# Patient Record
Sex: Male | Born: 1945
Health system: Southern US, Community
[De-identification: ages and names within clinical notes are randomized; demographics above are authoritative.]

## PROBLEM LIST (undated history)

## (undated) DIAGNOSIS — F419 Anxiety disorder, unspecified: Secondary | ICD-10-CM

## (undated) DIAGNOSIS — F329 Major depressive disorder, single episode, unspecified: Secondary | ICD-10-CM

## (undated) DIAGNOSIS — J309 Allergic rhinitis, unspecified: Secondary | ICD-10-CM

## (undated) DIAGNOSIS — F32A Depression, unspecified: Secondary | ICD-10-CM

## (undated) DIAGNOSIS — E119 Type 2 diabetes mellitus without complications: Secondary | ICD-10-CM

## (undated) DIAGNOSIS — M7918 Myalgia, other site: Secondary | ICD-10-CM

## (undated) DIAGNOSIS — N400 Enlarged prostate without lower urinary tract symptoms: Secondary | ICD-10-CM

## (undated) DIAGNOSIS — R0600 Dyspnea, unspecified: Secondary | ICD-10-CM

## (undated) DIAGNOSIS — E785 Hyperlipidemia, unspecified: Secondary | ICD-10-CM

## (undated) DIAGNOSIS — I10 Essential (primary) hypertension: Secondary | ICD-10-CM

## (undated) DIAGNOSIS — Z636 Dependent relative needing care at home: Secondary | ICD-10-CM

## (undated) DIAGNOSIS — M5030 Other cervical disc degeneration, unspecified cervical region: Secondary | ICD-10-CM

## (undated) DIAGNOSIS — R7302 Impaired glucose tolerance (oral): Secondary | ICD-10-CM

## (undated) DIAGNOSIS — E8881 Metabolic syndrome: Secondary | ICD-10-CM

## (undated) HISTORY — DX: Benign prostatic hyperplasia without lower urinary tract symptoms: N40.0

## (undated) HISTORY — DX: Impaired glucose tolerance (oral): R73.02

## (undated) HISTORY — PX: CARPAL TUNNEL RELEASE: SHX101

## (undated) HISTORY — PX: SHOULDER ARTHROSCOPY: SHX128

## (undated) HISTORY — DX: Hyperlipidemia, unspecified: E78.5

## (undated) HISTORY — DX: Essential (primary) hypertension: I10

## (undated) HISTORY — PX: BACK SURGERY: SHX140

---
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
## 1998-01-04 ENCOUNTER — Ambulatory Visit (HOSPITAL_COMMUNITY): Admission: RE | Admit: 1998-01-04 | Discharge: 1998-01-04 | Payer: Self-pay | Admitting: Internal Medicine

## 1998-01-04 ENCOUNTER — Encounter: Payer: Self-pay | Admitting: Internal Medicine

## 1998-08-19 ENCOUNTER — Ambulatory Visit (HOSPITAL_COMMUNITY): Admission: RE | Admit: 1998-08-19 | Discharge: 1998-08-19 | Payer: Self-pay | Admitting: Internal Medicine

## 1998-08-19 ENCOUNTER — Encounter: Payer: Self-pay | Admitting: Internal Medicine

## 1998-09-29 ENCOUNTER — Encounter: Payer: Self-pay | Admitting: Emergency Medicine

## 1998-09-29 ENCOUNTER — Emergency Department (HOSPITAL_COMMUNITY): Admission: EM | Admit: 1998-09-29 | Discharge: 1998-09-29 | Payer: Self-pay | Admitting: Emergency Medicine

## 1998-10-25 ENCOUNTER — Encounter: Payer: Self-pay | Admitting: Internal Medicine

## 1998-10-25 ENCOUNTER — Ambulatory Visit (HOSPITAL_COMMUNITY): Admission: RE | Admit: 1998-10-25 | Discharge: 1998-10-25 | Payer: Self-pay | Admitting: Internal Medicine

## 1998-10-26 ENCOUNTER — Emergency Department (HOSPITAL_COMMUNITY): Admission: EM | Admit: 1998-10-26 | Discharge: 1998-10-26 | Payer: Self-pay | Admitting: *Deleted

## 1999-12-23 ENCOUNTER — Ambulatory Visit (HOSPITAL_COMMUNITY): Admission: RE | Admit: 1999-12-23 | Discharge: 1999-12-23 | Payer: Self-pay | Admitting: *Deleted

## 2005-06-09 ENCOUNTER — Observation Stay (HOSPITAL_COMMUNITY): Admission: EM | Admit: 2005-06-09 | Discharge: 2005-06-10 | Payer: Self-pay | Admitting: Emergency Medicine

## 2005-06-17 ENCOUNTER — Encounter: Payer: Self-pay | Admitting: Cardiovascular Disease

## 2005-07-29 ENCOUNTER — Encounter: Admission: RE | Admit: 2005-07-29 | Discharge: 2005-07-29 | Payer: Self-pay | Admitting: Internal Medicine

## 2006-06-24 ENCOUNTER — Encounter: Admission: RE | Admit: 2006-06-24 | Discharge: 2006-09-22 | Payer: Self-pay | Admitting: Family Medicine

## 2008-01-11 ENCOUNTER — Ambulatory Visit: Payer: Self-pay | Admitting: Internal Medicine

## 2008-01-11 DIAGNOSIS — N401 Enlarged prostate with lower urinary tract symptoms: Secondary | ICD-10-CM | POA: Insufficient documentation

## 2008-01-11 DIAGNOSIS — E785 Hyperlipidemia, unspecified: Secondary | ICD-10-CM | POA: Insufficient documentation

## 2008-01-11 DIAGNOSIS — N400 Enlarged prostate without lower urinary tract symptoms: Secondary | ICD-10-CM | POA: Insufficient documentation

## 2008-01-11 DIAGNOSIS — I10 Essential (primary) hypertension: Secondary | ICD-10-CM | POA: Insufficient documentation

## 2008-01-12 ENCOUNTER — Encounter: Payer: Self-pay | Admitting: Internal Medicine

## 2008-01-12 LAB — CONVERTED CEMR LAB
ALT: 30 units/L (ref 0–53)
AST: 25 units/L (ref 0–37)
Albumin: 3.8 g/dL (ref 3.5–5.2)
Alkaline Phosphatase: 50 units/L (ref 39–117)
BUN: 11 mg/dL (ref 6–23)
Bacteria, UA: NEGATIVE
Basophils Absolute: 0.1 10*3/uL (ref 0.0–0.1)
Basophils Relative: 0.8 % (ref 0.0–3.0)
Bilirubin Urine: NEGATIVE
Bilirubin, Direct: 0.1 mg/dL (ref 0.0–0.3)
CO2: 30 meq/L (ref 19–32)
Calcium: 8.9 mg/dL (ref 8.4–10.5)
Chloride: 105 meq/L (ref 96–112)
Cholesterol: 229 mg/dL (ref 0–200)
Creatinine, Ser: 1 mg/dL (ref 0.4–1.5)
Crystals: NEGATIVE
Direct LDL: 124.5 mg/dL
Eosinophils Absolute: 0.4 10*3/uL (ref 0.0–0.7)
Eosinophils Relative: 6.4 % — ABNORMAL HIGH (ref 0.0–5.0)
GFR calc Af Amer: 97 mL/min
GFR calc non Af Amer: 80 mL/min
Glucose, Bld: 99 mg/dL (ref 70–99)
HCT: 45.4 % (ref 39.0–52.0)
HDL: 33.6 mg/dL — ABNORMAL LOW (ref >39.0)
Hemoglobin, Urine: NEGATIVE
Hemoglobin: 16 g/dL (ref 13.0–17.0)
Hgb A1c MFr Bld: 5.7 % (ref 4.6–6.0)
Ketones, ur: NEGATIVE mg/dL
Leukocytes, UA: NEGATIVE
Lymphocytes Relative: 36.9 % (ref 12.0–46.0)
MCHC: 35.2 g/dL (ref 30.0–36.0)
MCV: 94 fL (ref 78.0–100.0)
Monocytes Absolute: 0.6 10*3/uL (ref 0.1–1.0)
Monocytes Relative: 8.8 % (ref 3.0–12.0)
Neutro Abs: 3.2 10*3/uL (ref 1.4–7.7)
Neutrophils Relative %: 47.1 % (ref 43.0–77.0)
Nitrite: NEGATIVE
PSA: 0.57 ng/mL (ref 0.10–4.00)
Platelets: 214 10*3/uL (ref 150–400)
Potassium: 3.8 meq/L (ref 3.5–5.1)
RBC: 4.84 M/uL (ref 4.22–5.81)
RDW: 12.3 % (ref 11.5–14.6)
Sodium: 142 meq/L (ref 135–145)
Specific Gravity, Urine: 1.025 (ref 1.000–1.03)
Squamous Epithelial / HPF: NEGATIVE /lpf
TSH: 4.67 microintl units/mL (ref 0.35–5.50)
Total Bilirubin: 0.7 mg/dL (ref 0.3–1.2)
Total CHOL/HDL Ratio: 6.8
Total Protein, Urine: NEGATIVE mg/dL
Total Protein: 7.1 g/dL (ref 6.0–8.3)
Triglycerides: 317 mg/dL (ref 0–149)
Urine Glucose: NEGATIVE mg/dL
Urobilinogen, UA: 0.2 (ref 0.0–1.0)
VLDL: 63 mg/dL — ABNORMAL HIGH (ref 0–40)
WBC: 6.8 10*3/uL (ref 4.5–10.5)
pH: 5.5 (ref 5.0–8.0)

## 2008-01-26 ENCOUNTER — Ambulatory Visit: Payer: Self-pay | Admitting: Internal Medicine

## 2008-02-13 ENCOUNTER — Telehealth: Payer: Self-pay | Admitting: Internal Medicine

## 2008-07-18 ENCOUNTER — Ambulatory Visit: Payer: Self-pay | Admitting: Internal Medicine

## 2008-07-18 DIAGNOSIS — J3089 Other allergic rhinitis: Secondary | ICD-10-CM | POA: Insufficient documentation

## 2008-07-18 LAB — CONVERTED CEMR LAB
Cholesterol, target level: 200 mg/dL
LDL Goal: 130 mg/dL

## 2008-11-21 ENCOUNTER — Encounter: Payer: Self-pay | Admitting: Internal Medicine

## 2008-12-27 ENCOUNTER — Ambulatory Visit: Payer: Self-pay | Admitting: Internal Medicine

## 2008-12-27 DIAGNOSIS — E8881 Metabolic syndrome: Secondary | ICD-10-CM | POA: Insufficient documentation

## 2008-12-27 LAB — CONVERTED CEMR LAB
Albumin: 4.2 g/dL (ref 3.5–5.2)
Alkaline Phosphatase: 46 units/L (ref 39–117)
Basophils Relative: 0.5 % (ref 0.0–3.0)
Bilirubin Urine: NEGATIVE
CO2: 30 meq/L (ref 19–32)
Chloride: 101 meq/L (ref 96–112)
Cholesterol: 224 mg/dL — ABNORMAL HIGH (ref 0–200)
Eosinophils Absolute: 0.2 10*3/uL (ref 0.0–0.7)
HCT: 46.3 % (ref 39.0–52.0)
Hemoglobin, Urine: NEGATIVE
Hemoglobin: 16.3 g/dL (ref 13.0–17.0)
Lymphs Abs: 1.8 10*3/uL (ref 0.7–4.0)
MCHC: 35.3 g/dL (ref 30.0–36.0)
MCV: 93.5 fL (ref 78.0–100.0)
Monocytes Absolute: 0.6 10*3/uL (ref 0.1–1.0)
Neutro Abs: 3.1 10*3/uL (ref 1.4–7.7)
Nitrite: NEGATIVE
PSA: 0.54 ng/mL (ref 0.10–4.00)
RBC: 4.95 M/uL (ref 4.22–5.81)
Sodium: 140 meq/L (ref 135–145)
Total CHOL/HDL Ratio: 6
Total Protein, Urine: NEGATIVE mg/dL
Total Protein: 8.2 g/dL (ref 6.0–8.3)

## 2009-02-27 ENCOUNTER — Ambulatory Visit: Payer: Self-pay | Admitting: Internal Medicine

## 2009-03-01 ENCOUNTER — Telehealth: Payer: Self-pay | Admitting: Internal Medicine

## 2009-03-07 ENCOUNTER — Encounter: Admission: RE | Admit: 2009-03-07 | Discharge: 2009-03-07 | Payer: Self-pay | Admitting: Internal Medicine

## 2009-04-05 ENCOUNTER — Telehealth: Payer: Self-pay | Admitting: Internal Medicine

## 2009-08-21 ENCOUNTER — Ambulatory Visit: Payer: Self-pay | Admitting: Internal Medicine

## 2010-03-13 ENCOUNTER — Telehealth: Payer: Self-pay | Admitting: Internal Medicine

## 2010-04-03 ENCOUNTER — Other Ambulatory Visit: Payer: Self-pay | Admitting: Internal Medicine

## 2010-04-03 ENCOUNTER — Encounter: Payer: Self-pay | Admitting: Internal Medicine

## 2010-04-03 ENCOUNTER — Ambulatory Visit
Admission: RE | Admit: 2010-04-03 | Discharge: 2010-04-03 | Payer: Self-pay | Source: Home / Self Care | Attending: Internal Medicine | Admitting: Internal Medicine

## 2010-04-03 DIAGNOSIS — R7309 Other abnormal glucose: Secondary | ICD-10-CM | POA: Insufficient documentation

## 2010-04-03 DIAGNOSIS — R740 Nonspecific elevation of levels of transaminase and lactic acid dehydrogenase [LDH]: Secondary | ICD-10-CM

## 2010-04-03 DIAGNOSIS — R7401 Elevation of levels of liver transaminase levels: Secondary | ICD-10-CM | POA: Insufficient documentation

## 2010-04-03 LAB — CBC WITH DIFFERENTIAL/PLATELET
Basophils Absolute: 0.1 10*3/uL (ref 0.0–0.1)
Basophils Relative: 1 % (ref 0.0–3.0)
Eosinophils Absolute: 0.3 10*3/uL (ref 0.0–0.7)
Eosinophils Relative: 4 % (ref 0.0–5.0)
HCT: 45.9 % (ref 39.0–52.0)
Hemoglobin: 16.1 g/dL (ref 13.0–17.0)
Lymphocytes Relative: 30.1 % (ref 12.0–46.0)
Lymphs Abs: 2.2 10*3/uL (ref 0.7–4.0)
MCHC: 35 g/dL (ref 30.0–36.0)
MCV: 95.6 fl (ref 78.0–100.0)
Monocytes Absolute: 0.7 10*3/uL (ref 0.1–1.0)
Monocytes Relative: 9 % (ref 3.0–12.0)
Neutro Abs: 4.2 10*3/uL (ref 1.4–7.7)
Neutrophils Relative %: 55.9 % (ref 43.0–77.0)
Platelets: 228 10*3/uL (ref 150.0–400.0)
RBC: 4.8 Mil/uL (ref 4.22–5.81)
RDW: 13.1 % (ref 11.5–14.6)
WBC: 7.4 10*3/uL (ref 4.5–10.5)

## 2010-04-03 LAB — BASIC METABOLIC PANEL
BUN: 15 mg/dL (ref 6–23)
CO2: 30 mEq/L (ref 19–32)
Calcium: 9.5 mg/dL (ref 8.4–10.5)
Chloride: 96 mEq/L (ref 96–112)
Creatinine, Ser: 0.9 mg/dL (ref 0.4–1.5)
GFR: 88.9 mL/min (ref >60.00)
Glucose, Bld: 103 mg/dL — ABNORMAL HIGH (ref 70–99)
Potassium: 3.9 mEq/L (ref 3.5–5.1)
Sodium: 136 mEq/L (ref 135–145)

## 2010-04-03 LAB — LIPID PANEL
Cholesterol: 221 mg/dL — ABNORMAL HIGH (ref 0–200)
HDL: 46.2 mg/dL (ref >39.00)
Total CHOL/HDL Ratio: 5
Triglycerides: 325 mg/dL — ABNORMAL HIGH (ref 0.0–149.0)
VLDL: 65 mg/dL — ABNORMAL HIGH (ref 0.0–40.0)

## 2010-04-03 LAB — HEPATIC FUNCTION PANEL
ALT: 50 U/L (ref 0–53)
AST: 34 U/L (ref 0–37)
Albumin: 4.3 g/dL (ref 3.5–5.2)
Alkaline Phosphatase: 51 U/L (ref 39–117)
Bilirubin, Direct: 0.1 mg/dL (ref 0.0–0.3)
Total Bilirubin: 0.8 mg/dL (ref 0.3–1.2)
Total Protein: 7.9 g/dL (ref 6.0–8.3)

## 2010-04-03 LAB — HEMOGLOBIN A1C: Hgb A1c MFr Bld: 6.1 % (ref 4.6–6.5)

## 2010-04-03 LAB — LDL CHOLESTEROL, DIRECT: Direct LDL: 135 mg/dL

## 2010-04-03 LAB — PSA: PSA: 0.6 ng/mL (ref 0.10–4.00)

## 2010-04-03 LAB — TSH: TSH: 2.01 u[IU]/mL (ref 0.35–5.50)

## 2010-04-15 ENCOUNTER — Ambulatory Visit
Admission: RE | Admit: 2010-04-15 | Discharge: 2010-04-15 | Payer: Self-pay | Source: Home / Self Care | Attending: Internal Medicine | Admitting: Internal Medicine

## 2010-04-15 DIAGNOSIS — H6120 Impacted cerumen, unspecified ear: Secondary | ICD-10-CM | POA: Insufficient documentation

## 2010-04-15 DIAGNOSIS — J0100 Acute maxillary sinusitis, unspecified: Secondary | ICD-10-CM | POA: Insufficient documentation

## 2010-04-15 NOTE — Assessment & Plan Note (Signed)
Summary: fu med bp-oyu   Vital Signs:  Patient profile:   65 year old male Height:      68 inches Weight:      191 pounds BMI:     29.15 O2 Sat:      97 % on Room air Temp:     98.0 degrees F oral Pulse rate:   91 / minute Pulse rhythm:   regular Resp:     16 per minute BP sitting:   118 / 84  (left arm) Cuff size:   large  Vitals Entered By: Rock Nephew CMA (August 21, 2009 1:02 PM)  Nutrition Counseling: Patient's BMI is greater than 25 and therefore counseled on weight management options.  O2 Flow:  Room air  Primary Care Provider:  Etta Grandchild MD   History of Present Illness:  Follow-Up Visit      This is a 65 year old man who presents for Follow-up visit.  The patient denies chest pain, palpitations, dizziness, syncope, edema, SOB, DOE, PND, and orthopnea.  Since the last visit the patient notes no new problems or concerns.  The patient reports taking meds as prescribed, monitoring BP, and dietary compliance.  When questioned about possible medication side effects, the patient notes none.    Lipid Management History:      Positive NCEP/ATP III risk factors include male age 65 years old or older, family history for ischemic heart disease (males less than 67 years old), and hypertension.  Negative NCEP/ATP III risk factors include non-diabetic, non-tobacco-user status, no ASHD (atherosclerotic heart disease), no prior stroke/TIA, no peripheral vascular disease, and no history of aortic aneurysm.        The patient states that he knows about the "Therapeutic Lifestyle Change" diet.  His compliance with the TLC diet is not at all.  The patient expresses understanding of adjunctive measures for cholesterol lowering.  Adjunctive measures started by the patient include aerobic exercise, fiber, ASA, omega-3 supplements, limit alcohol consumpton, and weight reduction.  He expresses no side effects from his lipid-lowering medication.  The patient denies any symptoms to suggest  myopathy or liver disease.    Preventive Screening-Counseling & Management  Alcohol-Tobacco     Alcohol drinks/day: 0     Smoking Status: never     Passive Smoke Exposure: no  Hep-HIV-STD-Contraception     HIV Risk: no      Drug Use:  no.    Medications Prior to Update: 1)  Zocor 20 Mg Tabs (Simvastatin) .... Take 1 Tablet By Mouth Once A Day 2)  Aspirin 81 Mg Tbec (Aspirin) 3)  Benicar Hct 40-12.5 Mg Tabs (Olmesartan Medoxomil-Hctz) .... Once Daily 4)  Omnaris 50 Mcg/act Susp (Ciclesonide) .... 2 Puffs Each Nostril Qd  Current Medications (verified): 1)  Zocor 20 Mg Tabs (Simvastatin) .... Take 1 Tablet By Mouth Once A Day 2)  Aspirin 81 Mg Tbec (Aspirin) 3)  Benicar Hct 40-12.5 Mg Tabs (Olmesartan Medoxomil-Hctz) .... Once Daily 4)  Omnaris 50 Mcg/act Susp (Ciclesonide) .... 2 Puffs Each Nostril Qd  Allergies (verified): 1)  ! Enalapril Maleate (Enalapril Maleate)  Past History:  Past Medical History: Last updated: 01/11/2008 Hyperlipidemia Hypertension Benign prostatic hypertrophy  Past Surgical History: Last updated: 01/11/2008 Denies surgical history  Family History: Last updated: 01/11/2008 Family History of Alcoholism/Addiction Family History of CAD Male 1st degree relative <50 Family History Diabetes 1st degree relative Family History High cholesterol Family History Kidney disease  Social History: Last updated: 01/11/2008 Single Never  Smoked Alcohol use-no Drug use-no Regular exercise-no Occupation: [employer]  Risk Factors: Alcohol Use: 0 (08/21/2009) Exercise: no (01/11/2008)  Risk Factors: Smoking Status: never (08/21/2009) Passive Smoke Exposure: no (08/21/2009)  Family History: Reviewed history from 01/11/2008 and no changes required. Family History of Alcoholism/Addiction Family History of CAD Male 1st degree relative <50 Family History Diabetes 1st degree relative Family History High cholesterol Family History Kidney  disease  Social History: Reviewed history from 01/11/2008 and no changes required. Single Never Smoked Alcohol use-no Drug use-no Regular exercise-no Occupation: Public affairs consultant  Review of Systems  The patient denies anorexia, fever, weight loss, weight gain, chest pain, peripheral edema, prolonged cough, abdominal pain, and enlarged lymph nodes.    Physical Exam  General:  alert, well-developed, well-nourished, well-hydrated, appropriate dress, normal appearance, healthy-appearing, cooperative to examination, and good hygiene.   Mouth:  Oral mucosa and oropharynx without lesions or exudates.  Teeth in good repair. Neck:  supple, full ROM, no masses, no thyromegaly, no JVD, normal carotid upstroke, and no carotid bruits.   Lungs:  Normal respiratory effort, chest expands symmetrically. Lungs are clear to auscultation, no crackles or wheezes. Heart:  Normal rate and regular rhythm. S1 and S2 normal without gallop, murmur, click, rub or other extra sounds. Abdomen:  soft, non-tender, normal bowel sounds, no distention, no masses, no guarding, no rigidity, no rebound tenderness, no abdominal hernia, no inguinal hernia, no hepatomegaly, and no splenomegaly.   Msk:  normal ROM, no joint tenderness, no joint swelling, no joint warmth, and no redness over joints.   Pulses:  R and L carotid,radial,femoral,dorsalis pedis and posterior tibial pulses are full and equal bilaterally Extremities:  No clubbing, cyanosis, edema, or deformity noted with normal full range of motion of all joints.   Neurologic:  No cranial nerve deficits noted. Station and gait are normal. Plantar reflexes are down-going bilaterally. DTRs are symmetrical throughout. Sensory, motor and coordinative functions appear intact. Skin:  turgor normal, color normal, no rashes, no suspicious lesions, no ecchymoses, no petechiae, and no purpura.   Cervical Nodes:  no anterior cervical adenopathy and no posterior cervical adenopathy.    Psych:  Cognition and judgment appear intact. Alert and cooperative with normal attention span and concentration. No apparent delusions, illusions, hallucinations   Impression & Recommendations:  Problem # 1:  HYPERTENSION (ICD-401.9) Assessment Improved  His updated medication list for this problem includes:    Benicar Hct 40-12.5 Mg Tabs (Olmesartan medoxomil-hctz) ..... Once daily  Problem # 2:  HYPERLIPIDEMIA (ICD-272.4) Assessment: Improved  His updated medication list for this problem includes:    Zocor 20 Mg Tabs (Simvastatin) .[name] Kitchen... Take 1 tablet by mouth once a day  Labs Reviewed: SGOT: 45 (12/27/2008)   SGPT: 81 (12/27/2008)  Lipid Goals: Chol Goal: 200 (07/18/2008)   HDL Goal: 40 (07/18/2008)   LDL Goal: 130 (07/18/2008)   TG Goal: 150 (07/18/2008)  Prior 10 Yr Risk Heart Disease: Not enough information (07/18/2008)   HDL:40.10 (12/27/2008), 33.6 (01/12/2008)  LDL:DEL (01/12/2008)  Chol:224 (12/27/2008), 229 (01/12/2008)  Trig:193.0 (12/27/2008), 317 (01/12/2008)  Complete Medication List: 1)  Zocor 20 Mg Tabs (Simvastatin) .... Take 1 tablet by mouth once a day 2)  Aspirin 81 Mg Tbec (Aspirin) 3)  Benicar Hct 40-12.5 Mg Tabs (Olmesartan medoxomil-hctz) .... Once daily 4)  Omnaris 50 Mcg/act Susp (Ciclesonide) .... 2 puffs each nostril qd  Lipid Assessment/Plan:      Based on NCEP/ATP III, the patient's risk factor category is "2 or more risk factors  and a calculated 10 year CAD risk of > 20%".  The patient's lipid goals are as follows: Total cholesterol goal is 200; LDL cholesterol goal is 130; HDL cholesterol goal is 40; Triglyceride goal is 150.     Patient Instructions: 1)  Please schedule a follow-up appointment in 4 months. 2)  It is important that you exercise regularly at least 20 minutes 5 times a week. If you develop chest pain, have severe difficulty breathing, or feel very tired , stop exercising immediately and seek medical attention. 3)  You need to  lose weight. Consider a lower calorie diet and regular exercise.  4)  Check your Blood Pressure regularly. If it is above 130/80: you should make an appointment.

## 2010-04-15 NOTE — Progress Notes (Signed)
Summary: RESULTS  Phone Note Call from Patient Call back at [facility] Phone [phone]   Summary of Call: Patient is requesting results of u/s.  Initial call taken by: Lamar Sprinkles, CMA,  April 05, 2009 11:40 AM  Follow-up for Phone Call        normal Follow-up by: Etta Grandchild MD,  April 05, 2009 11:44 AM  Additional Follow-up for Phone Call Additional follow up Details #1::        Pt notified Additional Follow-up by: Rock Nephew CMA,  April 05, 2009 5:00 PM

## 2010-04-17 NOTE — Letter (Signed)
Summary: Lipid Letter  [facility]  [address] [place], [place] [address]   Phone: [phone]  Fax: [phone]    04/03/2010  Sal Spratley [address] [place], [place]  [address]  Dear Martin [name]:  We have carefully reviewed your last lipid profile from 01/12/2008 and the results are noted below with a summary of recommendations for lipid management.    Cholesterol:       221     Goal: <200   HDL "good" Cholesterol:   42.35     Goal: >40   LDL "bad" Cholesterol:   135     Goal: <130   Triglycerides:       325.0     Goal: <150 wow    your blood sugars are slightly high consistent with early type II diabetes    TLC Diet (Therapeutic Lifestyle Change): Saturated Fats & Transfatty acids should be kept < 7% of total calories ***Reduce Saturated Fats Polyunstaurated Fat can be up to 10% of total calories Monounsaturated Fat Fat can be up to 20% of total calories Total Fat should be no greater than 25-35% of total calories Carbohydrates should be 50-60% of total calories Protein should be approximately 15% of total calories Fiber should be at least 20-30 grams a day ***Increased fiber may help lower LDL Total Cholesterol should be < 200mg /day Consider adding plant stanol/sterols to diet (example: Benacol spread) ***A higher intake of unsaturated fat may reduce Triglycerides and Increase HDL    Adjunctive Measures (may lower LIPIDS and reduce risk of Heart Attack) include: Aerobic Exercise (20-30 minutes 3-4 times a week) Limit Alcohol Consumption Weight Reduction Aspirin 75-81 mg a day by mouth (if not allergic or contraindicated) Dietary Fiber 20-30 grams a day by mouth     Current Medications: 1)    Zocor 20 Mg Tabs (Simvastatin) .... Take 1 tablet by mouth once a day 2)    Aspirin 81 Mg Tbec (Aspirin) 3)    Omnaris 50 Mcg/act Susp (Ciclesonide) .... 2 puffs each nostril qd 4)    Tribenzor 40-10-25 Mg Tabs (Olmesartan-amlodipine-hctz) .... One by mouth  once daily for high blood pressure  If you have any questions, please call. We appreciate being able to work with you.   Sincerely,    [facility] Etta Grandchild MD

## 2010-04-17 NOTE — Assessment & Plan Note (Signed)
Summary: FU/ WANTS LABS/ TO COME FASTING/NWS   Vital Signs:  Patient profile:   65 year old male Height:      68 inches Weight:      197.75 pounds BMI:     30.18 O2 Sat:      94 % on Room air Temp:     98 degrees F oral Pulse rate:   75 / minute Pulse rhythm:   regular Resp:     16 per minute BP supine:   158 / 90  (right arm) BP sitting:   156 / 90  (left arm) Cuff size:   regular  Vitals Entered By: Zella Ball Ewing CMA Duncan Dull) (April 03, 2010 10:04 AM)  Nutrition Counseling: Patient's BMI is greater than 25 and therefore counseled on weight management options.  O2 Flow:  Room air CC: followup/RE, Preventive Care, Hypertension Management, Lipid Management   Primary Care Provider:  Etta Grandchild MD  CC:  followup/RE, Preventive Care, Hypertension Management, and Lipid Management.  History of Present Illness: He returns for a complete physical and f/up on medical probelms.  Hypertension History:      He denies headache, chest pain, palpitations, dyspnea with exertion, orthopnea, PND, peripheral edema, visual symptoms, neurologic problems, syncope, and side effects from treatment.  He notes no problems with any antihypertensive medication side effects.        Positive major cardiovascular risk factors include male age 37 years old or older, hyperlipidemia, hypertension, and family history for ischemic heart disease (males less than 8 years old).  Negative major cardiovascular risk factors include no history of diabetes and non-tobacco-user status.        Further assessment for target organ damage reveals no history of ASHD, cardiac end-organ damage (CHF/LVH), stroke/TIA, peripheral vascular disease, renal insufficiency, or hypertensive retinopathy.    Lipid Management History:      Positive NCEP/ATP III risk factors include male age 68 years old or older, family history for ischemic heart disease (males less than 32 years old), and hypertension.  Negative NCEP/ATP III risk  factors include non-diabetic, non-tobacco-user status, no ASHD (atherosclerotic heart disease), no prior stroke/TIA, no peripheral vascular disease, and no history of aortic aneurysm.        The patient states that he knows about the "Therapeutic Lifestyle Change" diet.  His compliance with the TLC diet is not at all.  The patient expresses understanding of adjunctive measures for cholesterol lowering.  Adjunctive measures started by the patient include fiber, omega-3 supplements, limit alcohol consumpton, and weight reduction.  He expresses no side effects from his lipid-lowering medication.  The patient denies any symptoms to suggest myopathy or liver disease.      Preventive Screening-Counseling & Management  Alcohol-Tobacco     Alcohol drinks/day: 0     Alcohol Counseling: not indicated; patient does not drink     Smoking Status: never     Passive Smoke Exposure: no     Tobacco Counseling: not indicated; no tobacco use  Caffeine-Diet-Exercise     Does Patient Exercise: no     Exercise Counseling: to improve exercise regimen  Hep-HIV-STD-Contraception     Hepatitis Risk: no risk noted     HIV Risk: no     STD Risk: no risk noted     Dental Visit-last 6 months yes     Dental Care Counseling: to seek dental care; no dental care within six months     TSE monthly: yes     Testicular SE Education/Counseling  to perform regular STE  Safety-Violence-Falls     Seat Belt Use: yes     Helmet Use: n/a     Firearms in the Home: no firearms in the home     Smoke Detectors: yes     Violence in the Home: no risk noted      Sexual History:  not active.        Drug Use:  no.        Blood Transfusions:  no.    Clinical Review Panels:  Prevention   Last PSA:  0.54 (12/27/2008)  Immunizations   Last Tetanus Booster:  Tdap (04/03/2010)   Last Flu Vaccine:  Fluvax 3+ (11/15/2009)   Last Pneumovax:  Historical (12/21/2008)   Last Zoster Vaccine:  Zostavax (04/03/2010)  Lipid  Management   Cholesterol:  224 (12/27/2008)   LDL (bad choesterol):  DEL (01/12/2008)   HDL (good cholesterol):  40.10 (12/27/2008)  Diabetes Management   HgBA1C:  5.7 (01/12/2008)   Creatinine:  0.9 (12/27/2008)   Last Flu Vaccine:  Fluvax 3+ (11/15/2009)   Last Pneumovax:  Historical (12/21/2008)  CBC   WBC:  5.7 (12/27/2008)   RBC:  4.95 (12/27/2008)   Hgb:  16.3 (12/27/2008)   Hct:  46.3 (12/27/2008)   Platelets:  192.0 (12/27/2008)   MCV  93.5 (12/27/2008)   MCHC  35.3 (12/27/2008)   RDW  12.3 (12/27/2008)   PMN:  53.7 (12/27/2008)   Lymphs:  31.6 (12/27/2008)   Monos:  10.5 (12/27/2008)   Eosinophils:  3.7 (12/27/2008)   Basophil:  0.5 (12/27/2008)  Complete Metabolic Panel   Glucose:  108 (12/27/2008)   Sodium:  140 (12/27/2008)   Potassium:  3.9 (12/27/2008)   Chloride:  101 (12/27/2008)   CO2:  30 (12/27/2008)   BUN:  13 (12/27/2008)   Creatinine:  0.9 (12/27/2008)   Albumin:  4.2 (12/27/2008)   Total Protein:  8.2 (12/27/2008)   Calcium:  9.3 (12/27/2008)   Total Bili:  0.8 (12/27/2008)   Alk Phos:  46 (12/27/2008)   SGPT (ALT):  81 (12/27/2008)   SGOT (AST):  45 (12/27/2008)   Medications Prior to Update: 1)  Zocor 20 Mg Tabs (Simvastatin) .... Take 1 Tablet By Mouth Once A Day 2)  Aspirin 81 Mg Tbec (Aspirin) 3)  Omnaris 50 Mcg/act Susp (Ciclesonide) .... 2 Puffs Each Nostril Qd 4)  Losartan Potassium-Hctz 100-12.5 Mg Tabs (Losartan Potassium-Hctz) .... One By Mouth Once Daily For High Blood Pressure  Current Medications (verified): 1)  Zocor 20 Mg Tabs (Simvastatin) .... Take 1 Tablet By Mouth Once A Day 2)  Aspirin 81 Mg Tbec (Aspirin) 3)  Omnaris 50 Mcg/act Susp (Ciclesonide) .... 2 Puffs Each Nostril Qd 4)  Tribenzor 40-10-25 Mg Tabs (Olmesartan-Amlodipine-Hctz) .... One By Mouth Once Daily For High Blood Pressure  Allergies (verified): 1)  ! Enalapril Maleate (Enalapril Maleate)  Past History:  Past Medical History: Last updated:  01/11/2008 Hyperlipidemia Hypertension Benign prostatic hypertrophy  Past Surgical History: Last updated: 01/11/2008 Denies surgical history  Family History: Last updated: 01/11/2008 Family History of Alcoholism/Addiction Family History of CAD Male 1st degree relative <50 Family History Diabetes 1st degree relative Family History High cholesterol Family History Kidney disease  Social History: Last updated: 01/11/2008 Single Never Smoked Alcohol use-no Drug use-no Regular exercise-no Occupation: [employer]  Risk Factors: Alcohol Use: 0 (04/03/2010) Exercise: no (04/03/2010)  Risk Factors: Smoking Status: never (04/03/2010) Passive Smoke Exposure: no (04/03/2010)  Family History: Reviewed history from 01/11/2008 and no changes  required. Family History of Alcoholism/Addiction Family History of CAD Male 1st degree relative <50 Family History Diabetes 1st degree relative Family History High cholesterol Family History Kidney disease  Social History: Reviewed history from 01/11/2008 and no changes required. Single Never Smoked Alcohol use-no Drug use-no Regular exercise-no Occupation: Public affairs consultant Hepatitis Risk:  no risk noted STD Risk:  no risk noted Dental Care w/in 6 mos.:  yes Seat Belt Use:  yes Blood Transfusions:  no Sexual History:  not active  Review of Systems       The patient complains of weight gain.  The patient denies anorexia, fever, weight loss, chest pain, syncope, dyspnea on exertion, peripheral edema, prolonged cough, headaches, hemoptysis, abdominal pain, melena, hematochezia, severe indigestion/heartburn, hematuria, suspicious skin lesions, transient blindness, difficulty walking, enlarged lymph nodes, angioedema, and testicular masses.   GU:  Denies decreased libido, discharge, dysuria, erectile dysfunction, genital sores, hematuria, incontinence, nocturia, urinary frequency, and urinary hesitancy. Endo:  Complains of weight change; denies cold  intolerance, excessive hunger, excessive thirst, excessive urination, heat intolerance, and polyuria.  Physical Exam  General:  alert, well-developed, well-nourished, well-hydrated, appropriate dress, normal appearance, healthy-appearing, cooperative to examination, and good hygiene.  overweight-appearing.   Head:  normocephalic and atraumatic.   Eyes:  vision grossly intact and pupils round.   Ears:  R ear normal and L ear normal.   Mouth:  Oral mucosa and oropharynx without lesions or exudates.  Teeth in good repair. Neck:  supple, full ROM, no masses, no thyromegaly, no JVD, normal carotid upstroke, and no carotid bruits.   Lungs:  Normal respiratory effort, chest expands symmetrically. Lungs are clear to auscultation, no crackles or wheezes. Heart:  Normal rate and regular rhythm. S1 and S2 normal without gallop, murmur, click, rub or other extra sounds. Abdomen:  soft, non-tender, normal bowel sounds, no distention, no masses, no guarding, no rigidity, no rebound tenderness, no abdominal hernia, no inguinal hernia, no hepatomegaly, and no splenomegaly.   Rectal:  No external abnormalities noted. Normal sphincter tone. No rectal masses or tenderness. Genitalia:  uncircumcised, no hydrocele, no varicocele, no scrotal masses, no cutaneous lesions, no urethral discharge, R testes atrophic, and L testes atrophic.   Prostate:  no gland enlargement, no nodules, no asymmetry, and no induration.   Msk:  normal ROM, no joint tenderness, no joint swelling, no joint warmth, and no redness over joints.   Pulses:  R and L carotid,radial,femoral,dorsalis pedis and posterior tibial pulses are full and equal bilaterally Extremities:  No clubbing, cyanosis, edema, or deformity noted with normal full range of motion of all joints.   Neurologic:  No cranial nerve deficits noted. Station and gait are normal. Plantar reflexes are down-going bilaterally. DTRs are symmetrical throughout. Sensory, motor and  coordinative functions appear intact. Skin:  turgor normal, color normal, no rashes, no suspicious lesions, no ecchymoses, no petechiae, and no purpura.  seborrheic keratosis.   Cervical Nodes:  no anterior cervical adenopathy and no posterior cervical adenopathy.   Axillary Nodes:  no R axillary adenopathy and no L axillary adenopathy.   Inguinal Nodes:  no R inguinal adenopathy and no L inguinal adenopathy.   Psych:  Cognition and judgment appear intact. Alert and cooperative with normal attention span and concentration. No apparent delusions, illusions, hallucinations   Impression & Recommendations:  Problem # 1:  HYPERGLYCEMIA, BORDERLINE (ICD-790.29) Assessment New  Orders: Venipuncture (78469) TLB-Lipid Panel (80061-LIPID) TLB-BMP (Basic Metabolic Panel-BMET) (80048-METABOL) TLB-CBC Platelet - w/Differential (85025-CBCD) TLB-Hepatic/Liver Function Pnl (80076-HEPATIC) TLB-TSH (Thyroid  Stimulating Hormone) (84443-TSH) TLB-A1C / Hgb A1C (Glycohemoglobin) (83036-A1C) TLB-PSA (Prostate Specific Antigen) (84153-PSA) T-Hepatitis B Surface Antibody (78295-62130)  Labs Reviewed: Creat: 0.9 (12/27/2008)     Problem # 2:  TRANSAMINASES, SERUM, ELEVATED (ICD-790.4) Assessment: New this is probably fatty liver disease ers: Venipuncture (86578) TLB-Lipid Panel (80061-LIPID) TLB-BMP (Basic Metabolic Panel-BMET) (80048-METABOL) TLB-CBC Platelet - w/Differential (85025-CBCD) TLB-Hepatic/Liver Function Pnl (80076-HEPATIC) TLB-TSH (Thyroid Stimulating Hormone) (84443-TSH) TLB-A1C / Hgb A1C (Glycohemoglobin) (83036-A1C) TLB-PSA (Prostate Specific Antigen) (84153-PSA) T-Hepatitis B Surface Antibody (46962-95284)  Problem # 3:  DYSMETABOLIC SYNDROME (ICD-277.7) Assessment: Deteriorated  Orders: Venipuncture (13244) TLB-Lipid Panel (80061-LIPID) TLB-BMP (Basic Metabolic Panel-BMET) (80048-METABOL) TLB-CBC Platelet - w/Differential (85025-CBCD) TLB-Hepatic/Liver Function Pnl  (80076-HEPATIC) TLB-TSH (Thyroid Stimulating Hormone) (84443-TSH) TLB-A1C / Hgb A1C (Glycohemoglobin) (83036-A1C) TLB-PSA (Prostate Specific Antigen) (84153-PSA) T-Hepatitis B Surface Antibody (01027-25366)  Problem # 4:  ROUTINE GENERAL MEDICAL EXAM@HEALTH  CARE FACL (ICD-V70.0) Assessment: Unchanged  Orders: Venipuncture (44034) TLB-Lipid Panel (80061-LIPID) TLB-BMP (Basic Metabolic Panel-BMET) (80048-METABOL) TLB-CBC Platelet - w/Differential (85025-CBCD) TLB-Hepatic/Liver Function Pnl (80076-HEPATIC) TLB-TSH (Thyroid Stimulating Hormone) (84443-TSH) TLB-A1C / Hgb A1C (Glycohemoglobin) (83036-A1C) TLB-PSA (Prostate Specific Antigen) (84153-PSA) T-Hepatitis B Surface Antibody (74259-56387) Gastroenterology Referral (GI) Hemoccult Guaiac-1 spec.(in office) (82270)  Td Booster: Tdap (04/03/2010)   Flu Vax: Fluvax 3+ (11/15/2009)   Pneumovax: Historical (12/21/2008) Chol: 224 (12/27/2008)   HDL: 40.10 (12/27/2008)   LDL: DEL (01/12/2008)   TG: 193.0 (12/27/2008) TSH: 3.52 (12/27/2008)   HgbA1C: 5.7 (01/12/2008)   PSA: 0.54 (12/27/2008)  Discussed using sunscreen, use of alcohol, drug use, self testicular exam, routine dental care, routine eye care, routine physical exam, seat belts, multiple vitamins, osteoporosis prevention, adequate calcium intake in diet, and recommendations for immunizations.  Discussed exercise and checking cholesterol.  Also recommend checking PSA.  Problem # 5:  HYPERTENSION (ICD-401.9) Assessment: Deteriorated  The following medications were removed from the medication list:    Losartan Potassium-hctz 100-12.5 Mg Tabs (Losartan potassium-hctz) ..... One by mouth once daily for high blood pressure His updated medication list for this problem includes:    Tribenzor 40-10-25 Mg Tabs (Olmesartan-amlodipine-hctz) ..... One by mouth once daily for high blood pressure  Orders: Venipuncture (56433) TLB-Lipid Panel (80061-LIPID) TLB-BMP (Basic Metabolic  Panel-BMET) (80048-METABOL) TLB-CBC Platelet - w/Differential (85025-CBCD) TLB-Hepatic/Liver Function Pnl (80076-HEPATIC) TLB-TSH (Thyroid Stimulating Hormone) (84443-TSH) TLB-A1C / Hgb A1C (Glycohemoglobin) (83036-A1C) TLB-PSA (Prostate Specific Antigen) (84153-PSA) T-Hepatitis B Surface Antibody (29518-84166)  BP today: 156/90 Prior BP: 118/84 (08/21/2009)  Prior 10 Yr Risk Heart Disease: Not enough information (07/18/2008)  Labs Reviewed: K+: 3.9 (12/27/2008) Creat: : 0.9 (12/27/2008)   Chol: 224 (12/27/2008)   HDL: 40.10 (12/27/2008)   LDL: DEL (01/12/2008)   TG: 193.0 (12/27/2008)  Problem # 6:  BENIGN PROSTATIC HYPERTROPHY (ICD-600.00) Assessment: Unchanged  Orders: Venipuncture (06301) TLB-Lipid Panel (80061-LIPID) TLB-BMP (Basic Metabolic Panel-BMET) (80048-METABOL) TLB-CBC Platelet - w/Differential (85025-CBCD) TLB-Hepatic/Liver Function Pnl (80076-HEPATIC) TLB-TSH (Thyroid Stimulating Hormone) (84443-TSH) TLB-A1C / Hgb A1C (Glycohemoglobin) (83036-A1C) TLB-PSA (Prostate Specific Antigen) (84153-PSA) T-Hepatitis B Surface Antibody (60109-32355)  Complete Medication List: 1)  Zocor 20 Mg Tabs (Simvastatin) .... Take 1 tablet by mouth once a day 2)  Aspirin 81 Mg Tbec (Aspirin) 3)  Omnaris 50 Mcg/act Susp (Ciclesonide) .... 2 puffs each nostril qd 4)  Tribenzor 40-10-25 Mg Tabs (Olmesartan-amlodipine-hctz) .... One by mouth once daily for high blood pressure  Other Orders: Tdap => 57yrs IM (73220) Admin 1st Vaccine (25427) Zoster (Shingles) Vaccine Live [phone]) Admin of Any Addtl Vaccine (62831)  Hypertension Assessment/Plan:      The patient's hypertensive risk  group is category B: At least one risk factor (excluding diabetes) with no target organ damage.  Today's blood pressure is 156/90.  His blood pressure goal is < 140/90.  Lipid Assessment/Plan:      Based on NCEP/ATP III, the patient's risk factor category is "2 or more risk factors and a calculated 10 year  CAD risk of > 20%".  The patient's lipid goals are as follows: Total cholesterol goal is 200; LDL cholesterol goal is 130; HDL cholesterol goal is 40; Triglyceride goal is 150.    Colorectal Screening:  Current Recommendations:    Hemoccult: NEG X 1 today    Colonoscopy recommended: scheduled with G.I.  PSA Screening:    PSA: 0.54  (12/27/2008)    Reviewed PSA screening recommendations: PSA ordered  Immunization & Chemoprophylaxis:    Tetanus vaccine: Tdap  (04/03/2010)    Influenza vaccine: Fluvax 3+  (11/15/2009)    Pneumovax: Historical  (12/21/2008)  Patient Instructions: 1)  Please schedule a follow-up appointment in 2 months. 2)  It is important that you exercise regularly at least 20 minutes 5 times a week. If you develop chest pain, have severe difficulty breathing, or feel very tired , stop exercising immediately and seek medical attention. 3)  You need to lose weight. Consider a lower calorie diet and regular exercise.  4)  Schedule a colonoscopy/sigmoidoscopy to help detect colon cancer. 5)  Check your Blood Pressure regularly. If it is above 140/90: you should make an appointment. Prescriptions: TRIBENZOR 40-10-25 MG TABS (OLMESARTAN-AMLODIPINE-HCTZ) One by mouth once daily for high blood pressure  #140 x 0   Entered and Authorized by:   Etta Grandchild MD   Signed by:   Etta Grandchild MD on 04/03/2010   Method used:   Samples Given   RxID:   [id number]    Orders Added: 1)  Venipuncture [36415] 2)  TLB-Lipid Panel [80061-LIPID] 3)  TLB-BMP (Basic Metabolic Panel-BMET) [80048-METABOL] 4)  TLB-CBC Platelet - w/Differential [85025-CBCD] 5)  TLB-Hepatic/Liver Function Pnl [80076-HEPATIC] 6)  TLB-TSH (Thyroid Stimulating Hormone) [84443-TSH] 7)  TLB-A1C / Hgb A1C (Glycohemoglobin) [83036-A1C] 8)  TLB-PSA (Prostate Specific Antigen) [84153-PSA] 9)  T-Hepatitis B Surface Antibody [14782-95621] 10)  Tdap => 68yrs IM [90715] 11)  Admin 1st Vaccine [90471] 12)   Zoster (Shingles) Vaccine Live [90736] 13)  Admin of Any Addtl Vaccine [90472] 14)  Gastroenterology Referral [GI] 15)  Est. Patient 40-64 years [99396] 16)  Hemoccult Guaiac-1 spec.(in office) [82270] 17)  Est. Patient Level IV [30865]   Immunization History:  Influenza Immunization History:    Influenza:  fluvax 3+ (11/15/2009)  Immunizations Administered:  Tetanus Vaccine:    Vaccine Type: Tdap    Site: left deltoid    Mfr: GlaxoSmithKline    Dose: 0.5 ml    Route: IM    Given by: Zella Ball Ewing CMA (AAMA)    Exp. Date: 01/03/2012    Lot #: [id number]    VIS given: 02/01/08 version given April 03, 2010.  Zostavax # 1:    Vaccine Type: Zostavax    Site: right deltoid    Mfr: Merck    Dose: 0.5 ml    Route: [place]    Given by: Zella Ball Ewing CMA (AAMA)    Exp. Date: 11/01/2010    Lot #: [id number]    VIS given: 12/26/04 given April 03, 2010.   Immunization History:  Influenza Immunization History:    Influenza:  Fluvax 3+ (11/15/2009)  Immunizations Administered:  Tetanus Vaccine:  Vaccine Type: Tdap    Site: left deltoid    Mfr: GlaxoSmithKline    Dose: 0.5 ml    Route: IM    Given by: Zella Ball Ewing CMA (AAMA)    Exp. Date: 01/03/2012    Lot #: [id number]    VIS given: 02/01/08 version given April 03, 2010.  Zostavax # 1:    Vaccine Type: Zostavax    Site: right deltoid    Mfr: Merck    Dose: 0.5 ml    Route: [place]    Given by: Zella Ball Ewing CMA (AAMA)    Exp. Date: 11/01/2010    Lot #: [id number]    VIS given: 12/26/04 given April 03, 2010.    Prevention & Chronic Care Immunizations   Influenza vaccine: Fluvax 3+  (11/15/2009)    Tetanus booster: 04/03/2010: Tdap    Pneumococcal vaccine: Historical  (12/21/2008)    H. zoster vaccine: 04/03/2010: Zostavax  Colorectal Screening   Hemoccult: Not documented   Hemoccult action/deferral: NEG X 1 today  (04/03/2010)    Colonoscopy: Not documented   Colonoscopy action/deferral: scheduled with G.I.   (04/03/2010)  Other Screening   PSA: 0.54  (12/27/2008)   PSA ordered.   PSA action/deferral: PSA ordered  (04/03/2010)   Smoking status: never  (04/03/2010)  Lipids   Total Cholesterol: 224  (12/27/2008)   LDL: DEL  (01/12/2008)   LDL Direct: 153.1  (12/27/2008)   HDL: 40.10  (12/27/2008)   Triglycerides: 193.0  (12/27/2008)    SGOT (AST): 45  (12/27/2008)   SGPT (ALT): 81  (12/27/2008)   Alkaline phosphatase: 46  (12/27/2008)   Total bilirubin: 0.8  (12/27/2008)  Hypertension   Last Blood Pressure: 156 / 90  (04/03/2010)   Serum creatinine: 0.9  (12/27/2008)   Serum potassium 3.9  (12/27/2008)  Self-Management Support :    Hypertension self-management support: Not documented    Lipid self-management support: Not documented    Nursing Instructions: Give tetanus booster today Give Herpes zoster vaccine today

## 2010-04-17 NOTE — Progress Notes (Signed)
Summary: Benicar  Phone Note Call from Patient Call back at [facility] Phone [phone]   Summary of Call: Patient is requesting generic alt to benicar.  Initial call taken by: Lamar Sprinkles, CMA,  March 13, 2010 2:08 PM  Follow-up for Phone Call        Pt informed  Follow-up by: Lamar Sprinkles, CMA,  March 13, 2010 4:58 PM    New/Updated Medications: LOSARTAN POTASSIUM-HCTZ 100-12.5 MG TABS (LOSARTAN POTASSIUM-HCTZ) One by mouth once daily for high blood pressure Prescriptions: LOSARTAN POTASSIUM-HCTZ 100-12.5 MG TABS (LOSARTAN POTASSIUM-HCTZ) One by mouth once daily for high blood pressure  #30 x 11   Entered and Authorized by:   Etta Grandchild MD   Signed by:   Etta Grandchild MD on 03/13/2010   Method used:   Electronically to        [facility]  [phone]* (retail)       [address]       [place], [place]  [address]       Ph: [phone] or [phone]       Fax: [phone]   RxID:   [id number]

## 2010-04-23 NOTE — Assessment & Plan Note (Signed)
Summary: sinus congestion/SD   Vital Signs:  Patient profile:   65 year old male Height:      68 inches (172.72 cm) O2 Sat:      97 % on Room air Temp:     98.3 degrees F (36.83 degrees C) oral Pulse rate:   94 / minute BP sitting:   120 / 72  (left arm) Cuff size:   regular  Vitals Entered By: Orlan Leavens RMA (April 15, 2010 1:36 PM)  O2 Flow:  Room air CC: sinus congestion, URI symptoms Is Patient Diabetic? No Pain Assessment Patient in pain? no        Primary Care Provider:  Etta Grandchild MD  CC:  sinus congestion and URI symptoms.  History of Present Illness:  URI Symptoms      This is a 65 year old man who presents with URI symptoms.  The symptoms began 6 days ago.  The severity is described as moderate.  The patient reports nasal congestion, productive cough, and earache, but denies clear nasal discharge, purulent nasal discharge, sore throat, dry cough, and sick contacts.  Associated symptoms include fever.  The patient denies dyspnea, wheezing, rash, vomiting, and use of an antipyretic.  The patient also reports sneezing, seasonal symptoms, response to antihistamine, headache, and severe fatigue.  The patient denies itchy watery eyes, itchy throat, and muscle aches.  Risk factors for Strep sinusitis include unilateral facial pain and tender adenopathy.  The patient denies the following risk factors for Strep sinusitis: tooth pain, Strep exposure, and absence of cough.    Clinical Review Panels:  Immunizations   Last Tetanus Booster:  Tdap (04/03/2010)   Last Flu Vaccine:  Fluvax 3+ (11/15/2009)   Last Pneumovax:  Historical (12/21/2008)   Last Zoster Vaccine:  Zostavax (04/03/2010)  CBC   WBC:  7.4 (04/03/2010)   RBC:  4.80 (04/03/2010)   Hgb:  16.1 (04/03/2010)   Hct:  45.9 (04/03/2010)   Platelets:  228.0 (04/03/2010)   MCV  95.6 (04/03/2010)   MCHC  35.0 (04/03/2010)   RDW  13.1 (04/03/2010)   PMN:  55.9 (04/03/2010)   Lymphs:  30.1 (04/03/2010)  Monos:  9.0 (04/03/2010)   Eosinophils:  4.0 (04/03/2010)   Basophil:  1.0 (04/03/2010)  Complete Metabolic Panel   Glucose:  103 (04/03/2010)   Sodium:  136 (04/03/2010)   Potassium:  3.9 (04/03/2010)   Chloride:  96 (04/03/2010)   CO2:  30 (04/03/2010)   BUN:  15 (04/03/2010)   Creatinine:  0.9 (04/03/2010)   Albumin:  4.3 (04/03/2010)   Total Protein:  7.9 (04/03/2010)   Calcium:  9.5 (04/03/2010)   Total Bili:  0.8 (04/03/2010)   Alk Phos:  51 (04/03/2010)   SGPT (ALT):  50 (04/03/2010)   SGOT (AST):  34 (04/03/2010)   Current Medications (verified): 1)  Zocor 20 Mg Tabs (Simvastatin) .... Take 1 Tablet By Mouth Once A Day 2)  Aspirin 81 Mg Tbec (Aspirin) 3)  Omnaris 50 Mcg/act Susp (Ciclesonide) .... 2 Puffs Each Nostril Qd 4)  Tribenzor 40-10-25 Mg Tabs (Olmesartan-Amlodipine-Hctz) .... One By Mouth Once Daily For High Blood Pressure  Allergies (verified): 1)  ! Enalapril Maleate (Enalapril Maleate)  Past History:  Past Medical History: Hyperlipidemia Hypertension  Benign prostatic hypertrophy  Social History: Single Never Smoked  Alcohol use-no Drug use-no Regular exercise-no Occupation: Public affairs consultant  Review of Systems  The patient denies weight loss, syncope, and severe indigestion/heartburn.  c/o chronic dec hearing L>R -   Physical Exam  General:  alert, well-developed, well-nourished, and cooperative to examination.   mildly ill Eyes:  vision grossly intact; pupils equal, round and reactive to light.  conjunctiva and lids normal.    Ears:  normal pinnae bilaterally, without erythema, swelling, or tenderness to palpation. TMs largely obscured with cerumen impaction L>R. Hearing grossly diminished bilaterally  Nose:  tender over R>L max sinus to pressure Mouth:  teeth and gums in good repair; mucous membranes moist, without lesions or ulcers. oropharynx clear without exudate, mod erythema. +pnd Lungs:  normal respiratory effort, no intercostal  retractions or use of accessory muscles; normal breath sounds bilaterally - no crackles and no wheezes.    Heart:  normal rate, regular rhythm, no murmur, and no rub. BLE without edema.    Impression & Recommendations:  Problem # 1:  ACUTE MAXILLARY SINUSITIS (ICD-461.0)  His updated medication list for this problem includes:    Fluticasone Propionate 50 Mcg/act Susp (Fluticasone propionate) .[name] Kitchen... 2 sprays each nostril  every morning    Azithromycin 250 Mg Tabs (Azithromycin) .[name] Kitchen... 2 tabs by mouth today, then 1 by mouth daily starting tomorrow  Instructed on treatment. Call if symptoms persist or worsen.   Orders: Prescription Created Electronically [phone])  Problem # 2:  CERUMEN IMPACTION, BILATERAL (ICD-380.4) to return for irrigation after resolution of acute sinus infx symptoms   Complete Medication List: 1)  Zocor 20 Mg Tabs (Simvastatin) .... Take 1 tablet by mouth once a day 2)  Aspirin 81 Mg Tbec (Aspirin) 3)  Fluticasone Propionate 50 Mcg/act Susp (Fluticasone propionate) .... 2 sprays each nostril  every morning 4)  Tribenzor 40-10-25 Mg Tabs (Olmesartan-amlodipine-hctz) .... One by mouth once daily for high blood pressure 5)  Azithromycin 250 Mg Tabs (Azithromycin) .... 2 tabs by mouth today, then 1 by mouth daily starting tomorrow  Patient Instructions: 1)  it was good to see you today. 2)  Zpak and flonase nose spray for sinus symptoms - your prescriptions have been electronically submitted to your pharmacy. Please take as directed. Contact our office if you believe you're having problems with the medication(s).  3)  Get plenty of rest, drink lots of clear liquids, and use Tylenol or Ibuprofen for fever and comfort. ok to continue sudafed and benadryl if needed. 4)  Please schedule a follow-up appointment in 1-2 weeks with dr. Yetta Barre to clean your ears, call sooner if problems.  Prescriptions: FLUTICASONE PROPIONATE 50 MCG/ACT SUSP (FLUTICASONE PROPIONATE) 2 sprays each  nostril  every morning  #1 x 3   Entered and Authorized by:   Newt Lukes MD   Signed by:   Newt Lukes MD on 04/15/2010   Method used:   Electronically to        [facility]  [phone]* (retail)       [address]       [place], [place]  [address]       Ph: [phone] or [phone]       Fax: [phone]   RxID:   [id number] AZITHROMYCIN 250 MG TABS (AZITHROMYCIN) 2 tabs by mouth today, then 1 by mouth daily starting tomorrow  #6 x 0   Entered and Authorized by:   Newt Lukes MD   Signed by:   Newt Lukes MD on 04/15/2010   Method used:   Electronically to        [facility]  [address]  [phone]* (retail)       [address]       [place], [place]  [address]       Ph: [phone] or [phone]       Fax: [phone]   RxID:   [id number]    Orders Added: 1)  Prescription Created Electronically [G8553] 2)  Est. Patient Level IV [36644]

## 2010-04-24 ENCOUNTER — Encounter: Payer: Self-pay | Admitting: Internal Medicine

## 2010-04-24 ENCOUNTER — Ambulatory Visit (INDEPENDENT_AMBULATORY_CARE_PROVIDER_SITE_OTHER): Payer: BC Managed Care – PPO | Admitting: Internal Medicine

## 2010-04-24 DIAGNOSIS — J0100 Acute maxillary sinusitis, unspecified: Secondary | ICD-10-CM

## 2010-04-24 DIAGNOSIS — H9319 Tinnitus, unspecified ear: Secondary | ICD-10-CM | POA: Insufficient documentation

## 2010-04-24 DIAGNOSIS — I10 Essential (primary) hypertension: Secondary | ICD-10-CM

## 2010-04-24 DIAGNOSIS — H6120 Impacted cerumen, unspecified ear: Secondary | ICD-10-CM

## 2010-05-01 NOTE — Assessment & Plan Note (Signed)
Summary: 1-2 wk fu  stc   Vital Signs:  Patient profile:   65 year old male Height:      68 inches Weight:      195.75 pounds O2 Sat:      97 % on Room air Temp:     98.0 degrees F oral Pulse rate:   79 / minute Pulse rhythm:   regular Resp:     16 per minute BP sitting:   120 / 64  (left arm) Cuff size:   large  Vitals Entered By: Rock Nephew CMA (April 24, 2010 10:22 AM)  O2 Flow:  Room air  Primary Care Provider:  Etta Grandchild MD   History of Present Illness: He returns for f/up after a recent URI and is feeling better but still has a full sensation in both ears and chronic tinnitus.  Hypertension History:      He denies headache, chest pain, palpitations, dyspnea with exertion, orthopnea, PND, peripheral edema, visual symptoms, neurologic problems, syncope, and side effects from treatment.  He notes no problems with any antihypertensive medication side effects.        Positive major cardiovascular risk factors include male age 64 years old or older, hyperlipidemia, hypertension, and family history for ischemic heart disease (males less than 70 years old).  Negative major cardiovascular risk factors include no history of diabetes and non-tobacco-user status.        Further assessment for target organ damage reveals no history of ASHD, cardiac end-organ damage (CHF/LVH), stroke/TIA, peripheral vascular disease, renal insufficiency, or hypertensive retinopathy.     Preventive Screening-Counseling & Management  Alcohol-Tobacco     Alcohol drinks/day: 0     Alcohol Counseling: not indicated; patient does not drink     Smoking Status: never     Passive Smoke Exposure: no     Tobacco Counseling: not indicated; no tobacco use  Hep-HIV-STD-Contraception     Hepatitis Risk: no risk noted     HIV Risk: no     STD Risk: no risk noted     Dental Visit-last 6 months yes     Dental Care Counseling: to seek dental care; no dental care within six months     TSE monthly:  yes     Testicular SE Education/Counseling to perform regular STE      Sexual History:  not active.        Drug Use:  no.        Blood Transfusions:  no.    Clinical Review Panels:  Prevention   Last PSA:  0.60 (04/03/2010)  Immunizations   Last Tetanus Booster:  Tdap (04/03/2010)   Last Flu Vaccine:  Fluvax 3+ (11/15/2009)   Last Pneumovax:  Historical (12/21/2008)   Last Zoster Vaccine:  Zostavax (04/03/2010)  Lipid Management   Cholesterol:  221 (04/03/2010)   LDL (bad choesterol):  DEL (01/12/2008)   HDL (good cholesterol):  46.20 (04/03/2010)  Diabetes Management   HgBA1C:  6.1 (04/03/2010)   Creatinine:  0.9 (04/03/2010)   Last Flu Vaccine:  Fluvax 3+ (11/15/2009)   Last Pneumovax:  Historical (12/21/2008)  CBC   WBC:  7.4 (04/03/2010)   RBC:  4.80 (04/03/2010)   Hgb:  16.1 (04/03/2010)   Hct:  45.9 (04/03/2010)   Platelets:  228.0 (04/03/2010)   MCV  95.6 (04/03/2010)   MCHC  35.0 (04/03/2010)   RDW  13.1 (04/03/2010)   PMN:  55.9 (04/03/2010)   Lymphs:  30.1 (04/03/2010)   Monos:  9.0 (04/03/2010)   Eosinophils:  4.0 (04/03/2010)   Basophil:  1.0 (04/03/2010)  Complete Metabolic Panel   Glucose:  103 (04/03/2010)   Sodium:  136 (04/03/2010)   Potassium:  3.9 (04/03/2010)   Chloride:  96 (04/03/2010)   CO2:  30 (04/03/2010)   BUN:  15 (04/03/2010)   Creatinine:  0.9 (04/03/2010)   Albumin:  4.3 (04/03/2010)   Total Protein:  7.9 (04/03/2010)   Calcium:  9.5 (04/03/2010)   Total Bili:  0.8 (04/03/2010)   Alk Phos:  51 (04/03/2010)   SGPT (ALT):  50 (04/03/2010)   SGOT (AST):  34 (04/03/2010)   Medications Prior to Update: 1)  Zocor 20 Mg Tabs (Simvastatin) .... Take 1 Tablet By Mouth Once A Day 2)  Aspirin 81 Mg Tbec (Aspirin) 3)  Fluticasone Propionate 50 Mcg/act Susp (Fluticasone Propionate) .... 2 Sprays Each Nostril  Every Morning 4)  Tribenzor 40-10-25 Mg Tabs (Olmesartan-Amlodipine-Hctz) .... One By Mouth Once Daily For High Blood  Pressure  Current Medications (verified): 1)  Zocor 20 Mg Tabs (Simvastatin) .... Take 1 Tablet By Mouth Once A Day 2)  Aspirin 81 Mg Tbec (Aspirin) 3)  Fluticasone Propionate 50 Mcg/act Susp (Fluticasone Propionate) .... 2 Sprays Each Nostril  Every Morning 4)  Tribenzor 40-10-25 Mg Tabs (Olmesartan-Amlodipine-Hctz) .... One By Mouth Once Daily For High Blood Pressure  Allergies (verified): 1)  ! Enalapril Maleate (Enalapril Maleate)  Past History:  Past Medical History: Last updated: 04/15/2010 Hyperlipidemia Hypertension  Benign prostatic hypertrophy  Past Surgical History: Last updated: 01/11/2008 Denies surgical history  Family History: Last updated: 01/11/2008 Family History of Alcoholism/Addiction Family History of CAD Male 1st degree relative <50 Family History Diabetes 1st degree relative Family History High cholesterol Family History Kidney disease  Social History: Last updated: 04/15/2010 Single Never Smoked  Alcohol use-no Drug use-no Regular exercise-no Occupation: [employer]  Risk Factors: Alcohol Use: 0 (04/24/2010) Exercise: no (04/03/2010)  Risk Factors: Smoking Status: never (04/24/2010) Passive Smoke Exposure: no (04/24/2010)  Family History: Reviewed history from 01/11/2008 and no changes required. Family History of Alcoholism/Addiction Family History of CAD Male 1st degree relative <50 Family History Diabetes 1st degree relative Family History High cholesterol Family History Kidney disease  Social History: Reviewed history from 04/15/2010 and no changes required. Single Never Smoked  Alcohol use-no Drug use-no Regular exercise-no Occupation: Public affairs consultant  Review of Systems       The patient complains of weight gain and decreased hearing.  The patient denies anorexia, fever, weight loss, vision loss, hoarseness, chest pain, syncope, dyspnea on exertion, peripheral edema, prolonged cough, headaches, hemoptysis, abdominal pain, hematuria,  suspicious skin lesions, difficulty walking, depression, enlarged lymph nodes, and angioedema.   ENT:  Complains of decreased hearing and ringing in ears; denies difficulty swallowing, ear discharge, earache, hoarseness, nasal congestion, nosebleeds, postnasal drainage, sinus pressure, and sore throat.  Physical Exam  General:  alert, well-developed, well-nourished, well-hydrated, appropriate dress, normal appearance, healthy-appearing, and overweight-appearing.   Head:  normocephalic, atraumatic, no abnormalities observed, and no abnormalities palpated.   Eyes:  vision grossly intact, pupils equal, pupils round, and pupils reactive to light.   Ears:  no external deformities, R cerumen impaction, and L Cerumen impaction.   Nose:  no external deformity, no nasal discharge, no mucosal pallor, no mucosal edema, no airflow obstruction, no intranasal foreign body, no nasal polyps, no nasal mucosal lesions, no mucosal friability, no active bleeding or clots, no sinus percussion tenderness, and no septum abnormalities.   Mouth:  Oral mucosa and oropharynx without lesions or exudates.  Teeth in good repair. Neck:  supple, full ROM, no masses, no thyromegaly, no thyroid nodules or tenderness, no JVD, normal carotid upstroke, no carotid bruits, and no cervical lymphadenopathy.   Lungs:  normal respiratory effort, no intercostal retractions, no accessory muscle use, normal breath sounds, no dullness, no fremitus, no crackles, and no wheezes.   Heart:  normal rate, regular rhythm, no murmur, no gallop, no rub, and no JVD.   Abdomen:  soft, non-tender, normal bowel sounds, no distention, no masses, no guarding, no rigidity, no rebound tenderness, no abdominal hernia, no inguinal hernia, no hepatomegaly, and no splenomegaly.   Msk:  No deformity or scoliosis noted of thoracic or lumbar spine.   Pulses:  R and L carotid,radial,femoral,dorsalis pedis and posterior tibial pulses are full and equal  bilaterally Extremities:  No clubbing, cyanosis, edema, or deformity noted with normal full range of motion of all joints.   Neurologic:  No cranial nerve deficits noted. Station and gait are normal. Plantar reflexes are down-going bilaterally. DTRs are symmetrical throughout. Sensory, motor and coordinative functions appear intact. Skin:  Intact without suspicious lesions or rashes Cervical Nodes:  no anterior cervical adenopathy and no posterior cervical adenopathy.   Axillary Nodes:  no R axillary adenopathy and no L axillary adenopathy.   Psych:  Cognition and judgment appear intact. Alert and cooperative with normal attention span and concentration. No apparent delusions, illusions, hallucinations Additional Exam:  both ears were irrigated by me with colace liquid and then irrigated with water and a large amount of wax was removed, afterwards the ears are normal with normal TM's, no erythema, no effusions, no exudates, he tolerated this well   Impression & Recommendations:  Problem # 1:  CERUMEN IMPACTION, BILATERAL (ICD-380.4) Assessment New  Orders: Cerumen Impaction Removal (16109)  Problem # 2:  TINNITUS, CHRONIC (ICD-388.30) Assessment: New  Orders: Audiology (Audio)  Problem # 3:  ACUTE MAXILLARY SINUSITIS (ICD-461.0) Assessment: Improved  His updated medication list for this problem includes:    Fluticasone Propionate 50 Mcg/act Susp (Fluticasone propionate) .[name] Kitchen... 2 sprays each nostril  every morning  Instructed on treatment. Call if symptoms persist or worsen.   Problem # 4:  HYPERTENSION (ICD-401.9) Assessment: Improved  His updated medication list for this problem includes:    Tribenzor 40-10-25 Mg Tabs (Olmesartan-amlodipine-hctz) ..... One by mouth once daily for high blood pressure  BP today: 120/64 Prior BP: 120/72 (04/15/2010)  Prior 10 Yr Risk Heart Disease: Not enough information (07/18/2008)  Labs Reviewed: K+: 3.9 (04/03/2010) Creat: : 0.9  (04/03/2010)   Chol: 221 (04/03/2010)   HDL: 46.20 (04/03/2010)   LDL: DEL (01/12/2008)   TG: 325.0 (04/03/2010)  Complete Medication List: 1)  Zocor 20 Mg Tabs (Simvastatin) .... Take 1 tablet by mouth once a day 2)  Aspirin 81 Mg Tbec (Aspirin) 3)  Fluticasone Propionate 50 Mcg/act Susp (Fluticasone propionate) .... 2 sprays each nostril  every morning 4)  Tribenzor 40-10-25 Mg Tabs (Olmesartan-amlodipine-hctz) .... One by mouth once daily for high blood pressure  Hypertension Assessment/Plan:      The patient's hypertensive risk group is category B: At least one risk factor (excluding diabetes) with no target organ damage.  Today's blood pressure is 120/64.  His blood pressure goal is < 140/90.  Patient Instructions: 1)  Please schedule a follow-up appointment in 1 month. 2)  Check your Blood Pressure regularly. If it is above 130/80: you should make an appointment. 3)  Get plenty of rest, drink lots of clear liquids, and use Tylenol or Ibuprofen for fever and comfort. Return in 7-10 days if you're not better:sooner if you're feeling worse. 4)  Please get your hearing tested.   Orders Added: 1)  Audiology [Audio] 2)  Cerumen Impaction Removal [04540] 3)  Est. Patient Level IV [98119]

## 2010-05-22 ENCOUNTER — Encounter: Payer: Self-pay | Admitting: Internal Medicine

## 2010-05-23 ENCOUNTER — Telehealth: Payer: Self-pay | Admitting: Internal Medicine

## 2010-06-02 ENCOUNTER — Ambulatory Visit (INDEPENDENT_AMBULATORY_CARE_PROVIDER_SITE_OTHER): Payer: Medicare Other | Admitting: Internal Medicine

## 2010-06-02 ENCOUNTER — Encounter: Payer: Self-pay | Admitting: Internal Medicine

## 2010-06-02 DIAGNOSIS — I10 Essential (primary) hypertension: Secondary | ICD-10-CM

## 2010-06-02 DIAGNOSIS — H9319 Tinnitus, unspecified ear: Secondary | ICD-10-CM

## 2010-06-02 DIAGNOSIS — H7010 Chronic mastoiditis, unspecified ear: Secondary | ICD-10-CM

## 2010-06-03 ENCOUNTER — Other Ambulatory Visit: Payer: Self-pay | Admitting: Internal Medicine

## 2010-06-03 ENCOUNTER — Emergency Department (HOSPITAL_COMMUNITY)
Admission: EM | Admit: 2010-06-03 | Discharge: 2010-06-03 | Disposition: A | Discharge status: Home / Self Care | Payer: Medicare Other | Attending: Emergency Medicine | Admitting: Emergency Medicine

## 2010-06-03 DIAGNOSIS — E7800 Pure hypercholesterolemia, unspecified: Secondary | ICD-10-CM | POA: Insufficient documentation

## 2010-06-03 DIAGNOSIS — H7010 Chronic mastoiditis, unspecified ear: Secondary | ICD-10-CM

## 2010-06-03 DIAGNOSIS — K625 Hemorrhage of anus and rectum: Secondary | ICD-10-CM | POA: Insufficient documentation

## 2010-06-03 DIAGNOSIS — I10 Essential (primary) hypertension: Secondary | ICD-10-CM | POA: Insufficient documentation

## 2010-06-03 LAB — TYPE AND SCREEN
ABO/RH(D): O NEG
Antibody Screen: NEGATIVE

## 2010-06-03 LAB — CBC
HCT: 41.3 % (ref 39.0–52.0)
MCHC: 35.4 g/dL (ref 30.0–36.0)
MCV: 92.6 fL (ref 78.0–100.0)
Platelets: 222 10*3/uL (ref 150–400)
RDW: 12.4 % (ref 11.5–15.5)
WBC: 10.1 10*3/uL (ref 4.0–10.5)

## 2010-06-03 LAB — COMPREHENSIVE METABOLIC PANEL
BUN: 14 mg/dL (ref 6–23)
CO2: 27 mEq/L (ref 19–32)
Calcium: 9.1 mg/dL (ref 8.4–10.5)
Chloride: 101 mEq/L (ref 96–112)
Creatinine, Ser: 0.86 mg/dL (ref 0.4–1.5)
GFR calc Af Amer: >60 mL/min (ref >60)
GFR calc non Af Amer: >60 mL/min (ref >60)
Total Bilirubin: 0.4 mg/dL (ref 0.3–1.2)

## 2010-06-03 LAB — ABO/RH: ABO/RH(D): O NEG

## 2010-06-03 LAB — OCCULT BLOOD, POC DEVICE: Fecal Occult Bld: POSITIVE

## 2010-06-03 NOTE — Therapy (Signed)
Summary: [facility]   Imported By: Sherian Rein 05/27/2010 15:01:41  _____________________________________________________________________  External Attachment:    Type:   Image     Comment:   External Document

## 2010-06-04 ENCOUNTER — Ambulatory Visit: Payer: Self-pay | Admitting: Internal Medicine

## 2010-06-04 ENCOUNTER — Encounter (INDEPENDENT_AMBULATORY_CARE_PROVIDER_SITE_OTHER): Payer: Self-pay | Admitting: *Deleted

## 2010-06-06 ENCOUNTER — Ambulatory Visit: Payer: Medicare Other

## 2010-06-06 VITALS — Ht 68.0 in | Wt 198.2 lb

## 2010-06-06 DIAGNOSIS — K625 Hemorrhage of anus and rectum: Secondary | ICD-10-CM

## 2010-06-06 MED ORDER — PEG-KCL-NACL-NASULF-NA ASC-C 100 G PO SOLR: 1.0000 | Freq: Once | ORAL | Status: AC

## 2010-06-06 NOTE — Patient Instructions (Signed)
Pt to hold Tribenzor day of procedure.

## 2010-06-09 ENCOUNTER — Telehealth: Payer: Self-pay | Admitting: *Deleted

## 2010-06-09 ENCOUNTER — Ambulatory Visit (HOSPITAL_COMMUNITY)
Admission: RE | Admit: 2010-06-09 | Discharge: 2010-06-09 | Disposition: A | Discharge status: Home / Self Care | Payer: Medicare Other | Source: Ambulatory Visit | Attending: Internal Medicine | Admitting: Internal Medicine

## 2010-06-09 ENCOUNTER — Telehealth: Payer: Self-pay | Admitting: Gastroenterology

## 2010-06-09 DIAGNOSIS — H7010 Chronic mastoiditis, unspecified ear: Secondary | ICD-10-CM | POA: Insufficient documentation

## 2010-06-09 MED ORDER — PEG-KCL-NACL-NASULF-NA ASC-C 100 G PO SOLR: 1.0000 | Freq: Once | ORAL | Status: AC

## 2010-06-09 NOTE — Telephone Encounter (Signed)
Patient's prep sent to wrong [facility], resend to [facility]. Patient notified.

## 2010-06-12 ENCOUNTER — Ambulatory Visit: Payer: Medicare Other | Admitting: Internal Medicine

## 2010-06-12 ENCOUNTER — Telehealth: Payer: Self-pay | Admitting: Gastroenterology

## 2010-06-12 NOTE — Telephone Encounter (Signed)
Advised pt. To take morning prep 1/2 to 1 hour earlier to give himself more time at home to expel the prep.  Stress to him that he had to take the morning dose of Movi prep to be assured an adequate clean out.  Suggested he pad his pants or use a pull up that day for more security.  He verbalized understanding.

## 2010-06-12 NOTE — Assessment & Plan Note (Signed)
Summary: BONE BEHIND EAR BOTHERING PT--DID NOT SAY PAIN OR WHAT-PT WAS.[name] KitchenMarland Kitchen   Vital Signs:  Patient profile:   65 year old male Height:      68 inches (172.72 cm) Weight:      195.75 pounds (88.98 kg) BMI:     29.87 O2 Sat:      97 % on Room air Temp:     98.3 degrees F (36.83 degrees C) oral Pulse rate:   90 / minute Pulse rhythm:   regular Resp:     14 per minute BP sitting:   124 / 68  (left arm) Cuff size:   regular  Vitals Entered By: Burnard Leigh CMA(AAMA) (June 02, 2010 3:30 PM)  Nutrition Counseling: Patient's BMI is greater than 25 and therefore counseled on weight management options.  O2 Flow:  Room air CC: Pt c/o pain behind Left ear that radiated to shoulders and arms x3 days and has a pulse noise in it. Pt states improves w/rest.sls,cma Is Patient Diabetic? No Comments Pt states he had impacted cerumen removed from ear last week at ENT. Pt would like to know if this could be a side effect od Tribenzor.?   Primary Care Provider:  Etta Grandchild MD  CC:  Pt c/o pain behind Left ear that radiated to shoulders and arms x3 days and has a pulse noise in it. Pt states improves w/rest.sls and cma.  History of Present Illness: He returns for f/up and he remains concerned about pulsating tinnitus and pain around his left mastoid bone. I got a consult note last week from audiology that recommended he see an ENT doctor for possible structural problems. He tells me that he saw ? ENT last week and he was told that there is nothing wrong with him. I have no records from the ENT visit. He "wants to get to the bottom of this."  Preventive Screening-Counseling & Management  Alcohol-Tobacco     Alcohol drinks/day: 0     Alcohol Counseling: not indicated; patient does not drink     Smoking Status: never     Passive Smoke Exposure: no     Tobacco Counseling: not indicated; no tobacco use  Hep-HIV-STD-Contraception     Hepatitis Risk: no risk noted     HIV Risk: no     STD  Risk: no risk noted     Dental Visit-last 6 months yes     Dental Care Counseling: to seek dental care; no dental care within six months     TSE monthly: yes     Testicular SE Education/Counseling to perform regular STE      Sexual History:  not active.        Drug Use:  no.        Blood Transfusions:  no.    Clinical Review Panels:  Prevention   Last PSA:  0.60 (04/03/2010)  Immunizations   Last Tetanus Booster:  Tdap (04/03/2010)   Last Flu Vaccine:  Fluvax 3+ (11/15/2009)   Last Pneumovax:  Historical (12/21/2008)   Last Zoster Vaccine:  Zostavax (04/03/2010)  Lipid Management   Cholesterol:  221 (04/03/2010)   LDL (bad choesterol):  DEL (01/12/2008)   HDL (good cholesterol):  46.20 (04/03/2010)  Diabetes Management   HgBA1C:  6.1 (04/03/2010)   Creatinine:  0.9 (04/03/2010)   Last Flu Vaccine:  Fluvax 3+ (11/15/2009)   Last Pneumovax:  Historical (12/21/2008)  CBC   WBC:  7.4 (04/03/2010)   RBC:  4.80 (04/03/2010)  Hgb:  16.1 (04/03/2010)   Hct:  45.9 (04/03/2010)   Platelets:  228.0 (04/03/2010)   MCV  95.6 (04/03/2010)   MCHC  35.0 (04/03/2010)   RDW  13.1 (04/03/2010)   PMN:  55.9 (04/03/2010)   Lymphs:  30.1 (04/03/2010)   Monos:  9.0 (04/03/2010)   Eosinophils:  4.0 (04/03/2010)   Basophil:  1.0 (04/03/2010)  Complete Metabolic Panel   Glucose:  103 (04/03/2010)   Sodium:  136 (04/03/2010)   Potassium:  3.9 (04/03/2010)   Chloride:  96 (04/03/2010)   CO2:  30 (04/03/2010)   BUN:  15 (04/03/2010)   Creatinine:  0.9 (04/03/2010)   Albumin:  4.3 (04/03/2010)   Total Protein:  7.9 (04/03/2010)   Calcium:  9.5 (04/03/2010)   Total Bili:  0.8 (04/03/2010)   Alk Phos:  51 (04/03/2010)   SGPT (ALT):  50 (04/03/2010)   SGOT (AST):  34 (04/03/2010)   Medications Prior to Update: 1)  Zocor 20 Mg Tabs (Simvastatin) .... Take 1 Tablet By Mouth Once A Day 2)  Aspirin 81 Mg Tbec (Aspirin) 3)  Fluticasone Propionate 50 Mcg/act Susp (Fluticasone  Propionate) .... 2 Sprays Each Nostril  Every Morning 4)  Tribenzor 40-10-25 Mg Tabs (Olmesartan-Amlodipine-Hctz) .... One By Mouth Once Daily For High Blood Pressure  Current Medications (verified): 1)  Zocor 20 Mg Tabs (Simvastatin) .... Take 1 Tablet By Mouth Once A Day 2)  Aspirin 81 Mg Tbec (Aspirin) 3)  Fluticasone Propionate 50 Mcg/act Susp (Fluticasone Propionate) .... 2 Sprays Each Nostril  Every Morning 4)  Tribenzor 40-10-25 Mg Tabs (Olmesartan-Amlodipine-Hctz) .... One By Mouth Once Daily For High Blood Pressure  Allergies (verified): 1)  ! Enalapril Maleate (Enalapril Maleate)  Past History:  Past Medical History: Last updated: 04/15/2010 Hyperlipidemia Hypertension  Benign prostatic hypertrophy  Past Surgical History: Last updated: 01/11/2008 Denies surgical history  Family History: Last updated: 01/11/2008 Family History of Alcoholism/Addiction Family History of CAD Male 1st degree relative <50 Family History Diabetes 1st degree relative Family History High cholesterol Family History Kidney disease  Social History: Last updated: 04/15/2010 Single Never Smoked  Alcohol use-no Drug use-no Regular exercise-no Occupation: [employer]  Risk Factors: Alcohol Use: 0 (06/02/2010) Exercise: no (04/03/2010)  Risk Factors: Smoking Status: never (06/02/2010) Passive Smoke Exposure: no (06/02/2010)  Family History: Reviewed history from 01/11/2008 and no changes required. Family History of Alcoholism/Addiction Family History of CAD Male 1st degree relative <50 Family History Diabetes 1st degree relative Family History High cholesterol Family History Kidney disease  Social History: Reviewed history from 04/15/2010 and no changes required. Single Never Smoked  Alcohol use-no Drug use-no Regular exercise-no Occupation: Public affairs consultant  Review of Systems  The patient denies anorexia, fever, chest pain, dyspnea on exertion, peripheral edema, prolonged cough,  headaches, hemoptysis, abdominal pain, hematuria, suspicious skin lesions, transient blindness, difficulty walking, depression, enlarged lymph nodes, and angioedema.   General:  Denies chills, fatigue, fever, loss of appetite, malaise, sleep disorder, and sweats. ENT:  Complains of ringing in ears; denies decreased hearing, difficulty swallowing, ear discharge, earache, hoarseness, nasal congestion, nosebleeds, postnasal drainage, sinus pressure, and sore throat. Derm:  Denies itching, lesion(s), and rash.  Physical Exam  General:  alert, well-developed, well-nourished, well-hydrated, appropriate dress, normal appearance, healthy-appearing, and overweight-appearing.   Head:  normocephalic, atraumatic, no abnormalities observed, no abnormalities palpated, and no alopecia.   Eyes:  vision grossly intact, pupils equal, pupils round, and pupils reactive to light.   Ears:  R ear normal and L ear normal.  Nose:  External nasal examination shows no deformity or inflammation. Nasal mucosa are pink and moist without lesions or exudates. Mouth:  Oral mucosa and oropharynx without lesions or exudates.  Teeth in good repair. Neck:  supple, full ROM, no masses, no thyromegaly, no thyroid nodules or tenderness, no JVD, normal carotid upstroke, no carotid bruits, and no cervical lymphadenopathy.   Lungs:  normal respiratory effort, no intercostal retractions, no accessory muscle use, normal breath sounds, no dullness, no fremitus, no crackles, and no wheezes.   Heart:  normal rate, regular rhythm, no murmur, no gallop, no rub, and no JVD.   Abdomen:  soft, non-tender, normal bowel sounds, no distention, no masses, no guarding, no rigidity, no rebound tenderness, no abdominal hernia, no inguinal hernia, no hepatomegaly, and no splenomegaly.   Msk:  No deformity or scoliosis noted of thoracic or lumbar spine.   Pulses:  R and L carotid,radial,femoral,dorsalis pedis and posterior tibial pulses are full and equal  bilaterally Extremities:  No clubbing, cyanosis, edema, or deformity noted with normal full range of motion of all joints.   Neurologic:  No cranial nerve deficits noted. Station and gait are normal. Plantar reflexes are down-going bilaterally. DTRs are symmetrical throughout. Sensory, motor and coordinative functions appear intact. Skin:  Intact without suspicious lesions or rashes Cervical Nodes:  No lymphadenopathy noted Psych:  Cognition and judgment appear intact. Alert and cooperative with normal attention span and concentration. No apparent delusions, illusions, hallucinations   Impression & Recommendations:  Problem # 1:  MASTOIDITIS, CHRONIC (ICD-383.1) I will get an MRI done to look for acoustic neuroma, masses, infection, cva, etc Orders: Radiology Referral (Radiology)  Problem # 2:  TINNITUS, CHRONIC (ICD-388.30) Assessment: Unchanged  Orders: Radiology Referral (Radiology)  Problem # 3:  HYPERTENSION (ICD-401.9) Assessment: Improved  His updated medication list for this problem includes:    Tribenzor 40-10-25 Mg Tabs (Olmesartan-amlodipine-hctz) ..... One by mouth once daily for high blood pressure  BP today: 124/68 Prior BP: 120/64 (04/24/2010)  Prior 10 Yr Risk Heart Disease: Not enough information (07/18/2008)  Labs Reviewed: K+: 3.9 (04/03/2010) Creat: : 0.9 (04/03/2010)   Chol: 221 (04/03/2010)   HDL: 46.20 (04/03/2010)   LDL: DEL (01/12/2008)   TG: 325.0 (04/03/2010)  Complete Medication List: 1)  Zocor 20 Mg Tabs (Simvastatin) .... Take 1 tablet by mouth once a day 2)  Aspirin 81 Mg Tbec (Aspirin) 3)  Fluticasone Propionate 50 Mcg/act Susp (Fluticasone propionate) .... 2 sprays each nostril  every morning 4)  Tribenzor 40-10-25 Mg Tabs (Olmesartan-amlodipine-hctz) .... One by mouth once daily for high blood pressure  Patient Instructions: 1)  Please schedule a follow-up appointment in 2 weeks. 2)  It is important that you exercise regularly at least 20  minutes 5 times a week. If you develop chest pain, have severe difficulty breathing, or feel very tired , stop exercising immediately and seek medical attention. 3)  You need to lose weight. Consider a lower calorie diet and regular exercise.  4)  Check your Blood Pressure regularly. If it is above 130/80: you should make an appointment.   Orders Added: 1)  Radiology Referral [Radiology] 2)  Est. Patient Level IV [57846]

## 2010-06-12 NOTE — Letter (Signed)
Summary: Pre Visit Letter Revised  [facility]  [address] [place], [place] [address]   Phone: [phone]  Fax: [phone]        06/04/2010 MRN: [id number] Martin [name] [address] [place], [place]  [address]  [place]             Procedure Date:  06-16-10           Direct Colon----Dr. Christella Hartigan   Welcome to the Gastroenterology Division at [facility].    You are scheduled to see a nurse for your pre-procedure visit on 06-06-10 at 11:00a.m. on the 3rd floor at [facility], [address]  We ask that you try to arrive at our office 15 minutes prior to your appointment time to allow for check-in.  Please take a minute to review the attached form.  If you answer "Yes" to one or more of the questions on the first page, we ask that you call the person listed at your earliest opportunity.  If you answer "No" to all of the questions, please complete the rest of the form and bring it to your appointment.    Your nurse visit will consist of discussing your medical and surgical history, your immediate family medical history, and your medications.   If you are unable to list all of your medications on the form, please bring the medication bottles to your appointment and we will list them.  We will need to be aware of both prescribed and over the counter drugs.  We will need to know exact dosage information as well.    Please be prepared to read and sign documents such as consent forms, a financial agreement, and acknowledgement forms.  If necessary, and with your consent, a friend or relative is welcome to sit-in on the nurse visit with you.  Please bring your insurance card so that we may make a copy of it.  If your insurance requires a referral to see a specialist, please bring your referral form from your primary care physician.  No co-pay is required for this nurse visit.     If you cannot keep your appointment, please call [phone] to cancel or reschedule prior  to your appointment date.  This allows [place] the opportunity to schedule an appointment for another patient in need of care.    Thank you for choosing [facility] for your medical needs.  We appreciate the opportunity to care for you.  Please visit [place] at our website  to learn more about our practice.  Sincerely, The Gastroenterology Division

## 2010-06-13 ENCOUNTER — Encounter: Payer: Self-pay | Admitting: Gastroenterology

## 2010-06-16 ENCOUNTER — Encounter: Payer: Self-pay | Admitting: Gastroenterology

## 2010-06-16 ENCOUNTER — Ambulatory Visit (AMBULATORY_SURGERY_CENTER): Payer: Medicare Other | Admitting: Gastroenterology

## 2010-06-16 VITALS — BP 153/92 | HR 77 | Temp 98.2°F | Resp 20 | Ht 68.0 in | Wt 190.0 lb

## 2010-06-16 DIAGNOSIS — D126 Benign neoplasm of colon, unspecified: Secondary | ICD-10-CM

## 2010-06-16 DIAGNOSIS — K625 Hemorrhage of anus and rectum: Secondary | ICD-10-CM

## 2010-06-16 DIAGNOSIS — K644 Residual hemorrhoidal skin tags: Secondary | ICD-10-CM

## 2010-06-16 DIAGNOSIS — K5730 Diverticulosis of large intestine without perforation or abscess without bleeding: Secondary | ICD-10-CM

## 2010-06-16 NOTE — Patient Instructions (Addendum)
Discharge instructions reviewed  Education material given on hemorrhoids, and polyp, diverticulosis  Pathology results will be mailed to you within 2 weeks  Can use over the counter preparation H as needed

## 2010-06-17 ENCOUNTER — Telehealth: Payer: Self-pay | Admitting: *Deleted

## 2010-06-17 NOTE — Telephone Encounter (Signed)

## 2010-06-23 ENCOUNTER — Telehealth: Payer: Self-pay | Admitting: Internal Medicine

## 2010-06-23 NOTE — Telephone Encounter (Signed)
Pt requests a call asap with MRI results, pt had MRi 3/26. ([phone])

## 2010-06-24 NOTE — Telephone Encounter (Signed)
normal

## 2010-07-02 ENCOUNTER — Encounter: Payer: Self-pay | Admitting: Internal Medicine

## 2010-07-02 ENCOUNTER — Ambulatory Visit (INDEPENDENT_AMBULATORY_CARE_PROVIDER_SITE_OTHER): Payer: Medicare Other | Admitting: Internal Medicine

## 2010-07-02 DIAGNOSIS — R7309 Other abnormal glucose: Secondary | ICD-10-CM

## 2010-07-02 DIAGNOSIS — M2669 Other specified disorders of temporomandibular joint: Secondary | ICD-10-CM

## 2010-07-02 DIAGNOSIS — R7402 Elevation of levels of lactic acid dehydrogenase (LDH): Secondary | ICD-10-CM

## 2010-07-02 DIAGNOSIS — E785 Hyperlipidemia, unspecified: Secondary | ICD-10-CM

## 2010-07-02 DIAGNOSIS — M26649 Arthritis of unspecified temporomandibular joint: Secondary | ICD-10-CM

## 2010-07-02 DIAGNOSIS — R7401 Elevation of levels of liver transaminase levels: Secondary | ICD-10-CM

## 2010-07-02 DIAGNOSIS — H9319 Tinnitus, unspecified ear: Secondary | ICD-10-CM

## 2010-07-02 DIAGNOSIS — J3089 Other allergic rhinitis: Secondary | ICD-10-CM

## 2010-07-02 DIAGNOSIS — I10 Essential (primary) hypertension: Secondary | ICD-10-CM

## 2010-07-02 MED ORDER — CELECOXIB 200 MG PO CAPS: 200.0000 mg | ORAL_CAPSULE | Freq: Every day | ORAL | Status: DC

## 2010-07-02 MED ORDER — EZETIMIBE-SIMVASTATIN 10-20 MG PO TABS: 1.0000 | ORAL_TABLET | Freq: Every day | ORAL | Status: DC

## 2010-07-02 MED ORDER — OMEGA-3-ACID ETHYL ESTERS 1 G PO CAPS: 2.0000 g | ORAL_CAPSULE | Freq: Two times a day (BID) | ORAL | Status: DC

## 2010-07-02 NOTE — Assessment & Plan Note (Signed)
Will change him to vytorin and lovaza for better control of LDL and triglycerides

## 2010-07-02 NOTE — Assessment & Plan Note (Signed)
Continue alavert

## 2010-07-02 NOTE — Progress Notes (Signed)
Subjective:    Patient ID: Martin [name], male    DOB: [birth year], 65 y.o.   MRN: [id number]  HPI He returns for f/up and he tells me that he has a persistent pain behind his left ear but that the tinnitus has improved. He tells me that he saw an ENT doctor who told him they thought it was TMJ arthritis, his MRI was normal and it did not show any evidence of mastoiditis, acoustic neuroma, mass, or infection. He has mild persistent nasal congestion but has stopped using flonase b/c it made him feel lightheaded.  Also, he is not impressed with his lipid levels and he wants to make some improvements in this area.  His BP is doing well and he has no side effects from his meds.    Review of Systems  Constitutional: Negative for fever, chills, diaphoresis, activity change, appetite change, fatigue and unexpected weight change.  HENT: Positive for congestion, rhinorrhea, sneezing, neck pain, postnasal drip and tinnitus. Negative for hearing loss, ear pain, nosebleeds, sore throat, facial swelling, drooling, mouth sores, trouble swallowing, neck stiffness, dental problem, voice change, sinus pressure and ear discharge.   Eyes: Negative for pain and itching.  Respiratory: Negative for apnea, cough, choking, chest tightness, shortness of breath, wheezing and stridor.   Cardiovascular: Negative for chest pain, palpitations and leg swelling.  Gastrointestinal: Negative for nausea, vomiting, abdominal pain, diarrhea, constipation, blood in stool and abdominal distention.  Genitourinary: Negative for dysuria, urgency, frequency, hematuria and decreased urine volume.  Musculoskeletal: Positive for arthralgias (left TMJ). Negative for myalgias, back pain, joint swelling and gait problem.  Skin: Negative for color change, pallor and rash.  Neurological: Negative for dizziness, tremors, seizures, syncope, facial asymmetry, speech difficulty, weakness, light-headedness, numbness and headaches.  Hematological:  Negative for adenopathy. Does not bruise/bleed easily.  Psychiatric/Behavioral: Negative for suicidal ideas, hallucinations, behavioral problems, confusion, sleep disturbance, self-injury, dysphoric mood, decreased concentration and agitation. The patient is not nervous/anxious and is not hyperactive.        Lab Results  Component Value Date   WBC 10.1 06/03/2010   HGB 14.6 06/03/2010   HCT 41.3 06/03/2010   PLT 222 06/03/2010   CHOL 221* 04/03/2010   TRIG 325.0* 04/03/2010   HDL 46.20 04/03/2010   LDLDIRECT 135.0 04/03/2010   ALT 33 06/03/2010   AST 27 06/03/2010   NA 136 06/03/2010   K 3.7 06/03/2010   CL 101 06/03/2010   CREATININE 0.86 06/03/2010   BUN 14 06/03/2010   CO2 27 06/03/2010   TSH 2.01 04/03/2010   PSA 0.60 04/03/2010   INR 0.96 06/03/2010   HGBA1C 6.1 04/03/2010   Objective:   Physical Exam  Constitutional: He appears well-developed and well-nourished. No distress.  HENT:  Head: Normocephalic and atraumatic.  Right Ear: Hearing, tympanic membrane, external ear and ear canal normal. No drainage, swelling or tenderness. No mastoid tenderness. Tympanic membrane is not injected and not bulging.  Left Ear: Hearing, tympanic membrane, external ear and ear canal normal. No drainage, swelling or tenderness. No mastoid tenderness. Tympanic membrane is not injected and not bulging.  Nose: Nose normal.  Mouth/Throat: Oropharynx is clear and moist. No oropharyngeal exudate.  Eyes: Conjunctivae and EOM are normal. Pupils are equal, round, and reactive to light. Right eye exhibits no discharge. Left eye exhibits no discharge. No scleral icterus.  Neck: Normal range of motion. Neck supple. No JVD present. No thyromegaly present.  Cardiovascular: Normal rate, regular rhythm, normal heart sounds and intact distal  pulses.  Exam reveals no gallop and no friction rub.   No murmur heard. Pulmonary/Chest: Effort normal and breath sounds normal. No stridor. No respiratory distress. He has no wheezes.  He has no rales. He exhibits no tenderness.  Abdominal: Soft. Bowel sounds are normal. He exhibits no distension and no mass. There is no tenderness. There is no rebound and no guarding.  Musculoskeletal: Normal range of motion. He exhibits no edema and no tenderness.  Lymphadenopathy:       Head (right side): No submental, no submandibular, no tonsillar, no preauricular, no posterior auricular and no occipital adenopathy present.       Head (left side): No submental, no submandibular, no tonsillar, no preauricular, no posterior auricular and no occipital adenopathy present.    He has no cervical adenopathy.    He has no axillary adenopathy.       Right: No inguinal and no supraclavicular adenopathy present.       Left: No inguinal and no supraclavicular adenopathy present.  Neurological: He is alert. He has normal reflexes. He displays normal reflexes. No cranial nerve deficit. He exhibits normal muscle tone.  Skin: Skin is warm and dry. No rash noted. He is not diaphoretic. No erythema. No pallor.  Psychiatric: He has a normal mood and affect. His behavior is normal. Judgment and thought content normal.          Assessment & Plan:

## 2010-07-02 NOTE — Assessment & Plan Note (Signed)
Start celebrex and he was given pt ed material

## 2010-07-02 NOTE — Assessment & Plan Note (Signed)
This has improved.

## 2010-07-02 NOTE — Patient Instructions (Signed)
Temporomandibular Joint (TMJ) Pain  Your exam shows that you have a problem with your TMJ, the joint that moves when you open your mouth or chew food. TMJ problems can result from direct injuries, bite abnormalities, or tension states which cause you to grind or clench your teeth. Typical symptoms include pain around the joint, clicking, restricted movement, and headaches.  The TMJ is like any other joint in the body; when it is strained, it needs rest to repair itself. To keep the joint at rest it is important that you do not open your mouth wider than the width of your index finger. If you must yawn, be sure to support your chin with your hand so your mouth does not open wide. Eat a soft diet (nothing firmer than ground beef, no raw vegetables), do not chew gum and do not talk if it causes you pain.  Apply topical heat by using a warm, moist cloth placed in front of the ear for 15-20 minutes several times daily. Alternating heat and ice may give even more relief. Anti-inflammatory pain medicine and muscle relaxants can also be helpful. A dental orthotic or splint may be used for temporary relief. Long-term problems may require treatment for stress as well as braces or surgery. Please check with your doctor or dentist if your symptoms do not improve within one week.  Document Released: 04/09/2004 Document Re-Released: 05/27/2009  Exit[employer] Patient Information 2011 [employer].

## 2010-07-02 NOTE — Assessment & Plan Note (Signed)
Continue f/up with ENT and audiology

## 2010-07-02 NOTE — Assessment & Plan Note (Signed)
His BP is well controlled 

## 2010-07-31 ENCOUNTER — Other Ambulatory Visit: Payer: Self-pay | Admitting: Neurosurgery

## 2010-07-31 DIAGNOSIS — M542 Cervicalgia: Secondary | ICD-10-CM

## 2010-08-01 NOTE — Procedures (Signed)
[place]  Patient:    Martin [name], Martin [name]                     MRN: [id number] Proc. Date: 12/23/99 Adm. Date:  [id number] Attending:  Mingo Amber CC:         Lilly Cove, M.D.   Procedure Report  PROCEDURES PERFORMED:  Video upper and lower endoscopy.  INDICATIONS:  Iron deficiency anemia in a 65 year old male who reports some hematochezia and underlying reflux.  It should be noted that minimally abnormal transaminases were repeated and are now normal.  PREPARATION:  He is npo since midnight having taken Phospho-Soda prep and a clear liquid diet.  The mucosa throughout is clean.  PREPROCEDURE SEDATION: Prior to the upper endoscopy he received 70 mg of Demerol and 7 mg of Versed intravenously. In addition, his throat was anesthetized with Hurricaine spray and he was on 2 liters of nasal cannula O2. He required no further sedation throughout.  DESCRIPTION OF PROCEDURE:  The Olympus video upper endoscope was inserted via the mouth and through the upper esophageal sphincter with ease. Intubation was then carried out well into the descending duodenum. On withdraw the mucosa was carefully evaluated and found to be entirely normal from descending duodenum to esophagus.  The Z line occurred at 40 cm. There was no obvious hiatal hernia and no sign of esophagitis.  The patient tolerated the procedure well. At its conclusion, his position was reversed for procedure number two, video colonoscopy.  The Olympus video colonoscope was inserted via the rectum and advanced quite easily all the way to the cecum.  Cecal landmarks were identified and photographed.  The ileocecal valve would not be traversed despite multiple attempts.  On withdrawal the colonic mucosa was evaluated throughout and found to be entirely normal from the cecum to retroflexed view of the rectum.  There were no areas of inflammation, neoplasia, diverticulosis or other  abnormality. Note was made of moderately enlarged external hemorrhoids on withdrawal of the scope.  There were no biopsies done and no noted complications.  The patient tolerated the procedure well.  Pulse, blood pressure and oximetry testing were stable throughout.  He will be observed in recovery for one hour and discharged if alert with a benign abdomen.  IMPRESSION: 1. Normal upper endoscopy with clinical reflux which has responded to Aciphex. 2. Normal colonoscopy with external hemorrhoids which likely account for    rectal bleeding. 3. Reported iron deficiency anemia which is not attributable for any    endoscopically localized source.  PLAN:  The patient is to return to the care of Dr. Karilyn Cota.  I would recommend a repeat CBC in four to six weeks. If he remains anemic, consideration should be given to obtaining a small bowel series.  I will be glad to reevaluate as needed.  Symptomatic care is recommended for external hemorrhoids and he is given a brochure about the same. DD:  12/23/99 TD:  12/24/99 Job: [id number]

## 2010-08-01 NOTE — H&P (Signed)
NAMEEMERICK, Martin [name] NO.:  [id number]   MEDICAL RECORD NO.:  [id number]          PATIENT TYPE:  INP   LOCATION:  2627                         FACILITY:  [facility]   PHYSICIAN:  Renato Battles, M.D.     DATE OF BIRTH:  1945/05/24   DATE OF ADMISSION:  06/08/2005  DATE OF DISCHARGE:                                HISTORY & PHYSICAL   REASON FOR ADMISSION:  Chest pain.   PRIMARY CARE PHYSICIAN:  Dr. Karilyn Cota   HISTORY OF PRESENT ILLNESS:  The patient is a 65 year old white male with  left anterior chest discomfort associated with left upper extremity weakness  and nausea but no vomiting in addition to cold sweating while he was  working.  This episode happened around 9 a.m. today and his weakness and  discomfort continued to worsen over time despite rest until he was  transported to the emergency department for further management.  During ED  evaluation he was found to have new right bundle-branch block that was not  there on older EKG done in 2000.   Currently, the patient reports no chest pain at this time   REVIEW OF SYSTEMS:  CONSTITUTIONAL:  No fever, chills, or night sweats, no  weight changes.  GI:  No vomiting.  Positive for nausea as above.  No  diarrhea or constipation.  GU:  No dysuria, hematuria, or retention.  CARDIOPULMONARY:  Positive for chest discomfort.  No shortness of breath,  orthopnea, or PND.  No cough.   PAST MEDICAL HISTORY:  1.  Hypertension.  2.  Hypercholesterolemia for which he was on Lipitor but was discontinued      recently secondary to intolerance.   PAST SURGICAL HISTORY:  Back surgery.   FAMILY HISTORY:  Positive for CAD.  His father died from heart disease at  the age of 60.   SOCIAL HISTORY:  No tobacco, alcohol, or drugs.  He works as a Conservation officer, nature at  [employer].   DRUG ALLERGIES:  No known drug allergies but the patient was intolerant to  LIPITOR.   HOME MEDICATIONS:  Hydrochlorothiazide 25 mg p.o. daily.   PHYSICAL  EXAMINATION:  GENERAL:  The patient is alert, oriented x3, no acute  distress.  VITAL SIGNS:  Temperature 97, heart rate 74, respiratory rate 22, blood  pressure 150/83.  HEENT:  Head is atraumatic, normocephalic.  Pupils equal, round, and  reactive to light and accommodation.  Extraocular movement intact  bilaterally.  NECK:  No lymphadenopathy, no thyromegaly, no JVD.  CHEST:  Clear to auscultation bilaterally.  No wheezing, rales, or rhonchi.  HEART:  Regular rate and rhythm. No murmurs.  ABDOMEN:  Soft, nontender, not distended.  Normal active bowel sounds.  EXTREMITIES:  No cyanosis, edema, or clubbing.   STUDIES:  Electrolytes showed elevated bicarb of 30, elevated glucose of  130, otherwise normal.   Point-of-care cardiac enzymes were negative.   EKG showed new right bundle-branch block.   ASSESSMENT:  1.  Chest pain with new right bundle-branch block.  2.  Hypertension.  3.  Hypercholesterolemia.  4.  Very strong family history for  coronary artery disease.   PLAN:  1.  Admit to telemetry.  2.  Start aspirin.  3.  Start beta blocker.  4.  Start Lovenox.  5.  Check fasting lipid panel.  6.  Continue hydrochlorothiazide.  7.  Evaluate for stress test and possibly cardiology consult.      Renato Battles, M.D.  Electronically Signed     SA/MEDQ  D:  06/09/2005  T:  06/10/2005  Job:  [id number]

## 2010-08-07 NOTE — Telephone Encounter (Signed)
Pt has already had procedure.  Issue resolved from previous encounter. Martin [name]

## 2010-08-14 ENCOUNTER — Ambulatory Visit
Admission: RE | Admit: 2010-08-14 | Discharge: 2010-08-14 | Disposition: A | Discharge status: Home / Self Care | Payer: Medicare Other | Source: Ambulatory Visit | Attending: Neurosurgery | Admitting: Neurosurgery

## 2010-08-14 DIAGNOSIS — M542 Cervicalgia: Secondary | ICD-10-CM

## 2010-11-19 ENCOUNTER — Telehealth: Payer: Self-pay

## 2010-11-19 NOTE — Telephone Encounter (Signed)
Yes, he needs an OV 

## 2010-11-19 NOTE — Telephone Encounter (Signed)
Pt called stating he needs help with his BP and cholesterol. Pt was no able to give a reason other than he helps with his mother and she needs 24 hr care. Pt had no BP readings to report and does not take BP meds prescribed by Dr Yetta Barre. I advised an OV and pt stated that he is unable to make OV due to mother who he needs to care for and although she has an appt with Dr Arlyce Dice friday and I offered to make an appt around the same time to help with situation pt declined stating he already told Dr Yetta Barre that he cannot make anymore OV. Pt then abruptly hung up stating we can do what we need to do and call him back. Please advise, pt needs OV?

## 2010-11-20 ENCOUNTER — Encounter: Payer: Self-pay | Admitting: Internal Medicine

## 2010-11-20 DIAGNOSIS — R7302 Impaired glucose tolerance (oral): Secondary | ICD-10-CM

## 2010-11-20 HISTORY — DX: Impaired glucose tolerance (oral): R73.02

## 2010-11-21 ENCOUNTER — Ambulatory Visit (INDEPENDENT_AMBULATORY_CARE_PROVIDER_SITE_OTHER): Payer: Medicare Other | Admitting: Internal Medicine

## 2010-11-21 ENCOUNTER — Encounter: Payer: Self-pay | Admitting: Internal Medicine

## 2010-11-21 VITALS — BP 142/80 | HR 78 | Temp 98.1°F | Ht 68.0 in | Wt 200.0 lb

## 2010-11-21 DIAGNOSIS — F329 Major depressive disorder, single episode, unspecified: Secondary | ICD-10-CM

## 2010-11-21 DIAGNOSIS — Z0000 Encounter for general adult medical examination without abnormal findings: Secondary | ICD-10-CM

## 2010-11-21 DIAGNOSIS — R7309 Other abnormal glucose: Secondary | ICD-10-CM

## 2010-11-21 DIAGNOSIS — F3289 Other specified depressive episodes: Secondary | ICD-10-CM

## 2010-11-21 DIAGNOSIS — F32A Depression, unspecified: Secondary | ICD-10-CM

## 2010-11-21 DIAGNOSIS — F411 Generalized anxiety disorder: Secondary | ICD-10-CM

## 2010-11-21 DIAGNOSIS — F419 Anxiety disorder, unspecified: Secondary | ICD-10-CM

## 2010-11-21 DIAGNOSIS — R7302 Impaired glucose tolerance (oral): Secondary | ICD-10-CM

## 2010-11-21 DIAGNOSIS — I10 Essential (primary) hypertension: Secondary | ICD-10-CM

## 2010-11-21 MED ORDER — LOSARTAN POTASSIUM-HCTZ 100-25 MG PO TABS: 1.0000 | ORAL_TABLET | Freq: Every day | ORAL | Status: DC

## 2010-11-21 MED ORDER — ALPRAZOLAM 0.25 MG PO TABS: 0.2500 mg | ORAL_TABLET | Freq: Three times a day (TID) | ORAL | Status: DC | PRN

## 2010-11-21 MED ORDER — ESCITALOPRAM OXALATE 10 MG PO TABS: 10.0000 mg | ORAL_TABLET | Freq: Every day | ORAL | Status: AC

## 2010-11-21 NOTE — Assessment & Plan Note (Signed)
Asympt,, last labs reviewed with pt- for f/u with next visit  Lab Results  Component Value Date   HGBA1C 6.1 04/03/2010

## 2010-11-21 NOTE — Patient Instructions (Signed)
Take all new medications as prescribed Continue all other medications as before Please continue to have your mother followed per Dr Vernell Barrier

## 2010-11-21 NOTE — Assessment & Plan Note (Signed)
situationally worse, for xanax prn panic attack;   to f/u any worsening symptoms or concerns, declines counseling at this time

## 2010-11-21 NOTE — Progress Notes (Signed)
Subjective:    Patient ID: Martin [name], male    DOB: Aug 01, 1945, 65 y.o.   MRN: [id number]  HPI  Here to f/u in Dr Yetta Barre abscence today';  States has been taking care of his elderly mother who has significant anxiety and debility and ? dementia, lives with her for 7 yrs, she's constantly c/o being cold, worse in the am; she sees [facility]  - Dr Vernell Barrier.  Ongoing stress for 6 months of mom's worsening situation has "worn me to a frazzle", severe problems sleeping at night, not resting well, marked anxiety, and mild depressive symptoms, without suicidal ideation;  Has had near panic episoes several times in the past 2 wks;  Feels overwhelmed, has sister who is estranged and not helpful.  Pt denies chest pain, increased sob or doe, wheezing, orthopnea, PND, increased LE swelling, palpitations, dizziness or syncope.  Pt denies new neurological symptoms such as new headache, or facial or extremity weakness or numbness   Pt denies polydipsia, polyuria.  Last felt this way in the late 70's when his father died.  Trying to keep mother out of assisted living for now.   States so much stress he has not been taking his medications recently, especially the BP pill that "I am just not comfortable with." Past Medical History  Diagnosis Date  . Hyperlipidemia   . Hypertension   . BPH (benign prostatic hyperplasia)   . Glaucoma     Bil.  . Cataract   . Impaired glucose tolerance 11/20/2010   Past Surgical History  Procedure Date  . Shoulder arthroscopy  right shoulder    with bone spurs  . Tonsilectomy, adenoidectomy, bilateral myringotomy and tubes     reports that he has never smoked. He has never used smokeless tobacco. He reports that he does not drink alcohol or use illicit drugs. family history includes Alcohol abuse in his other; Coronary artery disease in his other; Diabetes in his other; Hyperlipidemia in his other; and Kidney disease in his other. Allergies  Allergen Reactions  . Enalapril  Maleate     REACTION: rash   Current Outpatient Prescriptions on File Prior to Visit  Medication Sig Dispense Refill  . aspirin 81 MG EC tablet Take 81 mg by mouth daily.        . Multiple Vitamin (MULTIVITAMIN) capsule Take 1 capsule by mouth daily.        . celecoxib (CELEBREX) 200 MG capsule Take 1 capsule (200 mg total) by mouth daily.  30 capsule  2  . ezetimibe-simvastatin (VYTORIN) 10-20 MG per tablet Take 1 tablet by mouth at bedtime.  112 tablet  0  . fluticasone (FLONASE) 50 MCG/ACT nasal spray 2 sprays by Nasal route every morning.        . Olmesartan-Amlodipine-HCTZ (TRIBENZOR) 40-10-25 MG TABS Take 1 tablet by mouth daily.        [name] Kitchen omega-3 acid ethyl esters (LOVAZA) 1 G capsule Take 2 capsules (2 g total) by mouth 2 (two) times daily.  120 capsule  11   Review of Systems Review of Systems  Constitutional: Negative for diaphoresis and unexpected weight change.  HENT: Negative for drooling and tinnitus.   Eyes: Negative for photophobia and visual disturbance.  Respiratory: Negative for choking and stridor.   Gastrointestinal: Negative for vomiting and blood in stool.  Genitourinary: Negative for hematuria and decreased urine volume.  Musculoskeletal: Negative for gait problem.  Skin: Negative for color change and wound.  Neurological: Negative for tremors and numbness.  Objective:   Physical Exam BP 142/80  Pulse 78  Temp(Src) 98.1 F (36.7 C) (Oral)  Ht 5\' 8"  (1.727 m)  Wt 200 lb (90.719 kg)  BMI 30.41 kg/m2  SpO2 97% Physical Exam  VS noted Constitutional: Pt appears well-developed and well-nourished.  HENT: Head: Normocephalic.  Right Ear: External ear normal.  Left Ear: External ear normal.  Eyes: Conjunctivae and EOM are normal. Pupils are equal, round, and reactive to light.  Neck: Normal range of motion. Neck supple.  Cardiovascular: Normal rate and regular rhythm.   Pulmonary/Chest: Effort normal and breath sounds normal.  Abd:  Soft, NT,  non-distended, + BS Neurological: Pt is alert. No cranial nerve deficit.  Skin: Skin is warm. No erythema.  Psychiatric: Pt behavior is normal. Thought content normal. 2+ nervous, depressed appearing       Assessment & Plan:

## 2010-11-21 NOTE — Assessment & Plan Note (Addendum)
For some reason uncomfortable with prior med;  Will change to losartan-hct 100/25; f/u BP at home and next visit  BP Readings from Last 3 Encounters:  11/21/10 142/80  07/02/10 120/80  06/16/10 153/92

## 2010-11-22 ENCOUNTER — Encounter: Payer: Self-pay | Admitting: Internal Medicine

## 2010-11-22 NOTE — Assessment & Plan Note (Signed)
situationally worse, for lexapro asd,  to f/u any worsening symptoms or concerns, verified nonsuicidal, declines counseling

## 2011-06-10 ENCOUNTER — Ambulatory Visit (INDEPENDENT_AMBULATORY_CARE_PROVIDER_SITE_OTHER): Payer: Medicare Other | Admitting: Internal Medicine

## 2011-06-10 ENCOUNTER — Encounter: Payer: Self-pay | Admitting: Internal Medicine

## 2011-06-10 ENCOUNTER — Ambulatory Visit (INDEPENDENT_AMBULATORY_CARE_PROVIDER_SITE_OTHER)
Admission: RE | Admit: 2011-06-10 | Discharge: 2011-06-10 | Disposition: A | Discharge status: Home / Self Care | Payer: Medicare Other | Source: Ambulatory Visit | Attending: Internal Medicine | Admitting: Internal Medicine

## 2011-06-10 VITALS — BP 136/72 | HR 84 | Temp 97.5°F | Resp 16 | Wt 206.0 lb

## 2011-06-10 DIAGNOSIS — M542 Cervicalgia: Secondary | ICD-10-CM

## 2011-06-10 DIAGNOSIS — E785 Hyperlipidemia, unspecified: Secondary | ICD-10-CM

## 2011-06-10 DIAGNOSIS — I10 Essential (primary) hypertension: Secondary | ICD-10-CM

## 2011-06-10 MED ORDER — NAPROXEN SODIUM ER 500 MG PO TB24: 500.0000 mg | ORAL_TABLET | Freq: Every day | ORAL | Status: AC

## 2011-06-10 MED ORDER — FENOFIBRATE 50 MG PO CAPS: 1.0000 | ORAL_CAPSULE | Freq: Every day | ORAL | Status: DC

## 2011-06-10 NOTE — Assessment & Plan Note (Signed)
His BP is well controlled 

## 2011-06-10 NOTE — Assessment & Plan Note (Signed)
His trigs remain high and he has not made any progress with diet, exercise, or weight loss so I have asked him to start lipofen

## 2011-06-10 NOTE — Progress Notes (Signed)
Subjective:    Patient ID: Martin [name], male    DOB: Oct 16, 1945, 66 y.o.   MRN: [id number]  Neck Pain  This is a recurrent problem. The current episode started more than 1 year ago. The problem occurs intermittently. The problem has been unchanged. The pain is associated with nothing. The pain is present in the left side. The quality of the pain is described as aching and burning. The pain is at a severity of 3/10. The pain is mild. The symptoms are aggravated by nothing. The pain is same all the time. Stiffness is present all day. Pertinent negatives include no chest pain, fever, headaches, leg pain, numbness, pain with swallowing, paresis, photophobia, syncope, tingling, trouble swallowing, visual change, weakness or weight loss. He has tried nothing for the symptoms.  Hyperlipidemia This is a chronic problem. The current episode started more than 1 year ago. The problem is resistant. Recent lipid tests were reviewed and are variable. Exacerbating diseases include obesity. He has no history of chronic renal disease, diabetes, liver disease or nephrotic syndrome. Factors aggravating his hyperlipidemia include fatty foods. Pertinent negatives include no chest pain, focal sensory loss, focal weakness, leg pain, myalgias or shortness of breath. Current antihyperlipidemic treatment includes statins and ezetimibe. The current treatment provides mild improvement of lipids. Compliance problems include adherence to exercise and adherence to diet.       Review of Systems  Constitutional: Positive for unexpected weight change (weight gain). Negative for fever, chills, weight loss, diaphoresis, activity change, appetite change and fatigue.  HENT: Positive for neck pain. Negative for ear pain, facial swelling, trouble swallowing, neck stiffness and tinnitus.   Eyes: Negative.  Negative for photophobia.  Respiratory: Negative for apnea, cough, choking, chest tightness, shortness of breath, wheezing and  stridor.   Cardiovascular: Negative for chest pain, leg swelling and syncope.  Gastrointestinal: Negative for nausea, abdominal pain, diarrhea, constipation, blood in stool and abdominal distention.  Genitourinary: Negative.   Musculoskeletal: Negative for myalgias, back pain, joint swelling, arthralgias and gait problem.  Skin: Negative for color change, pallor, rash and wound.  Neurological: Negative for dizziness, tingling, tremors, focal weakness, seizures, syncope, facial asymmetry, speech difficulty, weakness, light-headedness, numbness and headaches.  Hematological: Negative for adenopathy. Does not bruise/bleed easily.  Psychiatric/Behavioral: Negative.        Objective:   Physical Exam  Vitals reviewed. Constitutional: He is oriented to person, place, and time. He appears well-developed and well-nourished. No distress.  HENT:  Head: Normocephalic and atraumatic.  Mouth/Throat: Oropharynx is clear and moist. No oropharyngeal exudate.  Eyes: Conjunctivae are normal. Right eye exhibits no discharge. Left eye exhibits no discharge. No scleral icterus.  Neck: Normal range of motion. Neck supple. No JVD present. No tracheal deviation present. No thyromegaly present.  Cardiovascular: Normal rate, regular rhythm, normal heart sounds and intact distal pulses.  Exam reveals no gallop and no friction rub.   No murmur heard. Pulmonary/Chest: Effort normal and breath sounds normal. No stridor. No respiratory distress. He has no wheezes. He has no rales. He exhibits no tenderness.  Abdominal: Soft. Bowel sounds are normal. He exhibits no distension and no mass. There is no tenderness. There is no rebound and no guarding.  Musculoskeletal: Normal range of motion. He exhibits no edema and no tenderness.       Cervical back: Normal. He exhibits normal range of motion, no tenderness, no bony tenderness, no swelling, no edema, no deformity, no laceration, no pain, no spasm and normal pulse.  Thoracic back: Normal. He exhibits normal range of motion, no tenderness, no bony tenderness, no swelling, no edema, no deformity, no laceration, no pain and no spasm.  Lymphadenopathy:    He has no cervical adenopathy.  Neurological: He is alert and oriented to person, place, and time. He has normal strength. He displays no atrophy, no tremor and normal reflexes. No cranial nerve deficit or sensory deficit. He exhibits normal muscle tone. He displays a negative Romberg sign. He displays no seizure activity. Coordination and gait normal. He displays no Babinski's sign on the right side. He displays no Babinski's sign on the left side.  Reflex Scores:      Tricep reflexes are 1+ on the right side and 1+ on the left side.      Bicep reflexes are 1+ on the right side and 1+ on the left side.      Brachioradialis reflexes are 1+ on the right side and 1+ on the left side.      Patellar reflexes are 1+ on the right side and 1+ on the left side.      Achilles reflexes are 1+ on the right side and 1+ on the left side. Skin: Skin is warm and dry. No rash noted. He is not diaphoretic. No erythema. No pallor.  Psychiatric: He has a normal mood and affect. His behavior is normal. Judgment and thought content normal.      Lab Results  Component Value Date   WBC 10.1 06/03/2010   HGB 14.6 06/03/2010   HCT 41.3 06/03/2010   PLT 222 06/03/2010   GLUCOSE 109* 06/03/2010   CHOL 221* 04/03/2010   TRIG 325.0* 04/03/2010   HDL 46.20 04/03/2010   LDLDIRECT 135.0 04/03/2010   ALT 33 06/03/2010   AST 27 06/03/2010   NA 136 06/03/2010   K 3.7 06/03/2010   CL 101 06/03/2010   CREATININE 0.86 06/03/2010   BUN 14 06/03/2010   CO2 27 06/03/2010   TSH 2.01 04/03/2010   PSA 0.60 04/03/2010   INR 0.96 06/03/2010   HGBA1C 6.1 04/03/2010      Assessment & Plan:

## 2011-06-10 NOTE — Assessment & Plan Note (Signed)
Start naprelan for the pain, I will check plain films today to see if he has spurring, ddd, etc

## 2011-06-10 NOTE — Patient Instructions (Signed)
Degenerative Disc Disease Degenerative disc disease is a condition caused by the changes that occur in the cushions of the backbone (spinal discs) as you grow older. Spinal discs are soft and compressible discs located between the bones of the spine (vertebrae). They act like shock absorbers. Degenerative disc disease can affect the wholespine. However, the neck and lower back are most commonly affected. Many changes can occur in the spinal discs with aging, such as:  The spinal discs may dry and shrink.   Small tears may occur in the tough, outer covering of the disc (annulus).   The disc space may become smaller due to loss of water.   Abnormal growths in the bone (spurs) may occur. This can put pressure on the nerve roots exiting the spinal canal, causing pain.   The spinal canal may become narrowed.  CAUSES  Degenerative disc disease is a condition caused by the changes that occur in the spinal discs with aging. The exact cause is not known, but there is a genetic basis for many patients. Degenerative changes can occur due to loss of fluid in the disc. This makes the disc thinner and reduces the space between the backbones. Small cracks can develop in the outer layer of the disc. This can lead to the breakdown of the disc. You are more likely to get degenerative disc disease if you are overweight. Smoking cigarettes and doing heavy work such as weightlifting can also increase your risk of this condition. Degenerative changes can start after a sudden injury. Growth of bone spurs can compress the nerve roots and cause pain.  SYMPTOMS  The symptoms vary from person to person. Some people may have no pain, while others have severe pain. The pain may be so severe that it can limit your activities. The location of the pain depends on the part of your backbone that is affected. You will have neck or arm pain if a disc in the neck area is affected. You will have pain in your back, buttocks, or legs if a  disc in the lower back is affected. The pain becomes worse while bending, reaching up, or with twisting movements. The pain may start gradually and then get worse as time passes. It may also start after a major or minor injury. You may feel numbness or tingling in the arms or legs.  DIAGNOSIS  Your caregiver will ask you about your symptoms and about activities or habits that may cause the pain. He or she may also ask about any injuries, diseases, ortreatments you have had earlier. Your caregiver will examine you to check for the range of movement that is possible in the affected area, to check for strength in your extremities, and to check for sensation in the areas of the arms and legs supplied by different nerve roots. An X-ray of the spine may be taken. Your caregiver may suggest other imaging tests, such as a computerized magnetic scan (MRI), if needed.  TREATMENT  Treatment includes rest, modifying your activities, and applying ice and heat. Your caregiver may prescribe medicines to reduce your pain and may ask you to do some exercises to strengthen your back. In some cases, you may need surgery. You and your caregiver will decide on the treatment that is best for you. HOME CARE INSTRUCTIONS   Follow proper lifting and walking techniques as advised by your caregiver.   Maintain good posture.   Exercise regularly as advised.   Perform relaxation exercises.   Change your sitting,   standing, and sleeping habits as advised. Change positions frequently.   Lose weight as advised.   Stop smoking if you smoke.   Wear supportive footwear.  SEEK MEDICAL CARE IF:  The pain does not go away within 1 to 4 weeks. SEEK IMMEDIATE MEDICAL CARE IF:   The pain is severe.   You notice weakness in your arms, hands, or legs.   You begin to lose control of your bladder or bowel.  MAKE SURE YOU:   Understand these instructions.   Will watch your condition.   Will get help right away if you are not  doing well or get worse.  Document Released: 12/28/2006 Document Revised: 02/19/2011 Document Reviewed: 12/28/2006 [facility] Patient Information 2012 [place], [place].Hypertriglyceridemia  Diet for High blood levels of Triglycerides Most fats in food are triglycerides. Triglycerides in your blood are stored as fat in your body. High levels of triglycerides in your blood may put you at a greater risk for heart disease and stroke.  Normal triglyceride levels are less than 150 mg/dL. Borderline high levels are 150-199 mg/dl. High levels are 200 - 499 mg/dL, and very high triglyceride levels are greater than 500 mg/dL. The decision to treat high triglycerides is generally based on the level. For people with borderline or high triglyceride levels, treatment includes weight loss and exercise. Drugs are recommended for people with very high triglyceride levels. Many people who need treatment for high triglyceride levels have metabolic syndrome. This syndrome is a collection of disorders that often include: insulin resistance, high blood pressure, blood clotting problems, high cholesterol and triglycerides. TESTING PROCEDURE FOR TRIGLYCERIDES  You should not eat 4 hours before getting your triglycerides measured. The normal range of triglycerides is between 10 and 250 milligrams per deciliter (mg/dl). Some people may have extreme levels (1000 or above), but your triglyceride level may be too high if it is above 150 mg/dl, depending on what other risk factors you have for heart disease.   People with high blood triglycerides may also have high blood cholesterol levels. If you have high blood cholesterol as well as high blood triglycerides, your risk for heart disease is probably greater than if you only had high triglycerides. High blood cholesterol is one of the main risk factors for heart disease.  CHANGING YOUR DIET  Your weight can affect your blood triglyceride level. If you are more than 20% above your ideal  body weight, you may be able to lower your blood triglycerides by losing weight. Eating less and exercising regularly is the best way to combat this. Fat provides more calories than any other food. The best way to lose weight is to eat less fat. Only 30% of your total calories should come from fat. Less than 7% of your diet should come from saturated fat. A diet low in fat and saturated fat is the same as a diet to decrease blood cholesterol. By eating a diet lower in fat, you may lose weight, lower your blood cholesterol, and lower your blood triglyceride level.  Eating a diet low in fat, especially saturated fat, may also help you lower your blood triglyceride level. Ask your dietitian to help you figure how much fat you can eat based on the number of calories your caregiver has prescribed for you.  Exercise, in addition to helping with weight loss may also help lower triglyceride levels.   Alcohol can increase blood triglycerides. You may need to stop drinking alcoholic beverages.   Too much carbohydrate in your diet  may also increase your blood triglycerides. Some complex carbohydrates are necessary in your diet. These may include bread, rice, potatoes, other starchy vegetables and cereals.   Reduce "simple" carbohydrates. These may include pure sugars, candy, honey, and jelly without losing other nutrients. If you have the kind of high blood triglycerides that is affected by the amount of carbohydrates in your diet, you will need to eat less sugar and less high-sugar foods. Your caregiver can help you with this.   Adding 2-4 grams of fish oil (EPA+ DHA) may also help lower triglycerides. Speak with your caregiver before adding any supplements to your regimen.  Following the Diet  Maintain your ideal weight. Your caregivers can help you with a diet. Generally, eating less food and getting more exercise will help you lose weight. Joining a weight control group may also help. Ask your caregivers for a  good weight control group in your area.  Eat low-fat foods instead of high-fat foods. This can help you lose weight too.  These foods are lower in fat. Eat MORE of these:   Dried beans, peas, and lentils.   Egg whites.   Low-fat cottage cheese.   Fish.   Lean cuts of meat, such as round, sirloin, rump, and flank (cut extra fat off meat you fix).   Whole grain breads, cereals and pasta.   Skim and nonfat dry milk.   Low-fat yogurt.   Poultry without the skin.   Cheese made with skim or part-skim milk, such as mozzarella, parmesan, farmers', ricotta, or pot cheese.  These are higher fat foods. Eat LESS of these:   Whole milk and foods made from whole milk, such as American, blue, cheddar, monterey jack, and swiss cheese   High-fat meats, such as luncheon meats, sausages, knockwurst, bratwurst, hot dogs, ribs, corned beef, ground pork, and regular ground beef.   Fried foods.  Limit saturated fats in your diet. Substituting unsaturated fat for saturated fat may decrease your blood triglyceride level. You will need to read package labels to know which products contain saturated fats.  These foods are high in saturated fat. Eat LESS of these:   Fried pork skins.   Whole milk.   Skin and fat from poultry.   Palm oil.   Butter.   Shortening.   Cream cheese.   Tomasa Blase.   Margarines and baked goods made from listed oils.   Vegetable shortenings.   Chitterlings.   Fat from meats.   Coconut oil.   Palm kernel oil.   Lard.   Cream.   Sour cream.   Fatback.   Coffee whiteners and non-dairy creamers made with these oils.   Cheese made from whole milk.  Use unsaturated fats (both polyunsaturated and monounsaturated) moderately. Remember, even though unsaturated fats are better than saturated fats; you still want a diet low in total fat.  These foods are high in unsaturated fat:   Canola oil.   Sunflower oil.   Mayonnaise.   Almonds.   Peanuts.   Pine  nuts.   Margarines made with these oils.   Safflower oil.   Olive oil.   Avocados.   Cashews.   Peanut butter.   Sunflower seeds.   Soybean oil.   Peanut oil.   Olives.   Pecans.   Walnuts.   Pumpkin seeds.  Avoid sugar and other high-sugar foods. This will decrease carbohydrates without decreasing other nutrients. Sugar in your food goes rapidly to your blood. When there is excess sugar in  your blood, your liver may use it to make more triglycerides. Sugar also contains calories without other important nutrients.  Eat LESS of these:   Sugar, brown sugar, powdered sugar, jam, jelly, preserves, honey, syrup, molasses, pies, candy, cakes, cookies, frosting, pastries, colas, soft drinks, punches, fruit drinks, and regular gelatin.   Avoid alcohol. Alcohol, even more than sugar, may increase blood triglycerides. In addition, alcohol is high in calories and low in nutrients. Ask for sparkling water, or a diet soft drink instead of an alcoholic beverage.  Suggestions for planning and preparing meals   Bake, broil, grill or roast meats instead of frying.   Remove fat from meats and skin from poultry before cooking.   Add spices, herbs, lemon juice or vinegar to vegetables instead of salt, rich sauces or gravies.   Use a non-stick skillet without fat or use no-stick sprays.   Cool and refrigerate stews and broth. Then remove the hardened fat floating on the surface before serving.   Refrigerate meat drippings and skim off fat to make low-fat gravies.   Serve more fish.   Use less butter, margarine and other high-fat spreads on bread or vegetables.   Use skim or reconstituted non-fat dry milk for cooking.   Cook with low-fat cheeses.   Substitute low-fat yogurt or cottage cheese for all or part of the sour cream in recipes for sauces, dips or congealed salads.   Use half yogurt/half mayonnaise in salad recipes.   Substitute evaporated skim milk for cream. Evaporated  skim milk or reconstituted non-fat dry milk can be whipped and substituted for whipped cream in certain recipes.   Choose fresh fruits for dessert instead of high-fat foods such as pies or cakes. Fruits are naturally low in fat.  When Dining Out   Order low-fat appetizers such as fruit or vegetable juice, pasta with vegetables or tomato sauce.   Select clear, rather than cream soups.   Ask that dressings and gravies be served on the side. Then use less of them.   Order foods that are baked, broiled, poached, steamed, stir-fried, or roasted.   Ask for margarine instead of butter, and use only a small amount.   Drink sparkling water, unsweetened tea or coffee, or diet soft drinks instead of alcohol or other sweet beverages.  QUESTIONS AND ANSWERS ABOUT OTHER FATS IN THE BLOOD: SATURATED FAT, TRANS FAT, AND CHOLESTEROL What is trans fat? Trans fat is a type of fat that is formed when vegetable oil is hardened through a process called hydrogenation. This process helps makes foods more solid, gives them shape, and prolongs their shelf life. Trans fats are also called hydrogenated or partially hydrogenated oils.  What do saturated fat, trans fat, and cholesterol in foods have to do with heart disease? Saturated fat, trans fat, and cholesterol in the diet all raise the level of LDL "bad" cholesterol in the blood. The higher the LDL cholesterol, the greater the risk for coronary heart disease (CHD). Saturated fat and trans fat raise LDL similarly.  What foods contain saturated fat, trans fat, and cholesterol? High amounts of saturated fat are found in animal products, such as fatty cuts of meat, chicken skin, and full-fat dairy products like butter, whole milk, cream, and cheese, and in tropical vegetable oils such as palm, palm kernel, and coconut oil. Trans fat is found in some of the same foods as saturated fat, such as vegetable shortening, some margarines (especially hard or stick margarine),  crackers, cookies, baked goods, fried  foods, salad dressings, and other processed foods made with partially hydrogenated vegetable oils. Small amounts of trans fat also occur naturally in some animal products, such as milk products, beef, and lamb. Foods high in cholesterol include liver, other organ meats, egg yolks, shrimp, and full-fat dairy products. How can I use the new food label to make heart-healthy food choices? Check the Nutrition Facts panel of the food label. Choose foods lower in saturated fat, trans fat, and cholesterol. For saturated fat and cholesterol, you can also use the Percent Daily Value (%DV): 5% DV or less is low, and 20% DV or more is high. (There is no %DV for trans fat.) Use the Nutrition Facts panel to choose foods low in saturated fat and cholesterol, and if the trans fat is not listed, read the ingredients and limit products that list shortening or hydrogenated or partially hydrogenated vegetable oil, which tend to be high in trans fat. POINTS TO REMEMBER: YOU NEED A LITTLE TLC (THERAPEUTIC LIFESTYLE CHANGES)  Discuss your risk for heart disease with your caregivers, and take steps to reduce risk factors.   Change your diet. Choose foods that are low in saturated fat, trans fat, and cholesterol.   Add exercise to your daily routine if it is not already being done. Participate in physical activity of moderate intensity, like brisk walking, for at least 30 minutes on most, and preferably all days of the week. No time? Break the 30 minutes into three, 10-minute segments during the day.   Stop smoking. If you do smoke, contact your caregiver to discuss ways in which they can help you quit.   Do not use street drugs.   Maintain a normal weight.   Maintain a healthy blood pressure.   Keep up with your blood work for checking the fats in your blood as directed by your caregiver.  Document Released: 12/19/2003 Document Revised: 02/19/2011 Document Reviewed:  07/16/2008 [facility] Patient Information 2012 [place], [place].

## 2011-06-17 ENCOUNTER — Telehealth: Payer: Self-pay

## 2011-06-17 NOTE — Telephone Encounter (Signed)
Mildly degenerated discs in his neck

## 2011-06-17 NOTE — Telephone Encounter (Signed)
Patient notified/LMOVM 

## 2011-06-17 NOTE — Telephone Encounter (Signed)
Pt called requesting results of recent Xray

## 2011-06-24 ENCOUNTER — Other Ambulatory Visit (INDEPENDENT_AMBULATORY_CARE_PROVIDER_SITE_OTHER): Payer: Medicare Other

## 2011-06-24 ENCOUNTER — Encounter: Payer: Self-pay | Admitting: Internal Medicine

## 2011-06-24 ENCOUNTER — Ambulatory Visit (INDEPENDENT_AMBULATORY_CARE_PROVIDER_SITE_OTHER): Payer: Medicare Other | Admitting: Internal Medicine

## 2011-06-24 VITALS — BP 138/80 | HR 84 | Temp 97.0°F | Resp 16 | Ht 68.0 in | Wt 205.0 lb

## 2011-06-24 DIAGNOSIS — M542 Cervicalgia: Secondary | ICD-10-CM

## 2011-06-24 DIAGNOSIS — R7302 Impaired glucose tolerance (oral): Secondary | ICD-10-CM

## 2011-06-24 DIAGNOSIS — R7309 Other abnormal glucose: Secondary | ICD-10-CM

## 2011-06-24 DIAGNOSIS — E785 Hyperlipidemia, unspecified: Secondary | ICD-10-CM

## 2011-06-24 DIAGNOSIS — I10 Essential (primary) hypertension: Secondary | ICD-10-CM

## 2011-06-24 DIAGNOSIS — M5030 Other cervical disc degeneration, unspecified cervical region: Secondary | ICD-10-CM

## 2011-06-24 DIAGNOSIS — N401 Enlarged prostate with lower urinary tract symptoms: Secondary | ICD-10-CM

## 2011-06-24 DIAGNOSIS — N138 Other obstructive and reflux uropathy: Secondary | ICD-10-CM

## 2011-06-24 LAB — CBC WITH DIFFERENTIAL/PLATELET
Basophils Absolute: 0.1 10*3/uL (ref 0.0–0.1)
Basophils Relative: 0.7 % (ref 0.0–3.0)
Eosinophils Absolute: 0.4 10*3/uL (ref 0.0–0.7)
HCT: 46.2 % (ref 39.0–52.0)
Hemoglobin: 15.7 g/dL (ref 13.0–17.0)
Lymphocytes Relative: 30.1 % (ref 12.0–46.0)
Lymphs Abs: 2.3 10*3/uL (ref 0.7–4.0)
MCHC: 34 g/dL (ref 30.0–36.0)
MCV: 97.2 fl (ref 78.0–100.0)
Neutro Abs: 4.2 10*3/uL (ref 1.4–7.7)
RBC: 4.76 Mil/uL (ref 4.22–5.81)
RDW: 13.3 % (ref 11.5–14.6)

## 2011-06-24 LAB — COMPREHENSIVE METABOLIC PANEL
ALT: 35 U/L (ref 0–53)
AST: 32 U/L (ref 0–37)
Alkaline Phosphatase: 38 U/L — ABNORMAL LOW (ref 39–117)
BUN: 18 mg/dL (ref 6–23)
Calcium: 9.7 mg/dL (ref 8.4–10.5)
Chloride: 103 mEq/L (ref 96–112)
Creatinine, Ser: 0.9 mg/dL (ref 0.4–1.5)
Total Bilirubin: 0.4 mg/dL (ref 0.3–1.2)

## 2011-06-24 LAB — LIPID PANEL
HDL: 40 mg/dL (ref >39.00)
Total CHOL/HDL Ratio: 7
Triglycerides: 872 mg/dL — ABNORMAL HIGH (ref 0.0–149.0)
VLDL: 174.4 mg/dL — ABNORMAL HIGH (ref 0.0–40.0)

## 2011-06-24 LAB — PSA: PSA: 0.68 ng/mL (ref 0.10–4.00)

## 2011-06-24 LAB — TSH: TSH: 3.45 u[IU]/mL (ref 0.35–5.50)

## 2011-06-24 MED ORDER — HYDROCODONE-ACETAMINOPHEN 5-500 MG PO TABS: 2.0000 | ORAL_TABLET | Freq: Four times a day (QID) | ORAL | Status: DC | PRN

## 2011-06-24 NOTE — Progress Notes (Signed)
Subjective:    Patient ID: Martin [name], male    DOB: [birth year], 66 y.o.   MRN: [id number]  Hypertension This is a chronic problem. The current episode started more than 1 year ago. The problem has been gradually improving since onset. The problem is controlled. Associated symptoms include neck pain. Pertinent negatives include no anxiety, blurred vision, chest pain, headaches, malaise/fatigue, orthopnea, palpitations, peripheral edema, PND, shortness of breath or sweats. There are no associated agents to hypertension. Past treatments include angiotensin blockers and diuretics. The current treatment provides moderate improvement. Compliance problems include exercise and diet.       Review of Systems  Constitutional: Negative for fever, chills, malaise/fatigue, diaphoresis, activity change, appetite change, fatigue and unexpected weight change.  HENT: Positive for neck pain and neck stiffness. Negative for facial swelling.   Eyes: Negative.  Negative for blurred vision.  Respiratory: Negative for cough, chest tightness, shortness of breath, wheezing and stridor.   Cardiovascular: Negative for chest pain, palpitations, orthopnea, leg swelling and PND.  Gastrointestinal: Negative for nausea, vomiting, abdominal pain, diarrhea, constipation, blood in stool and abdominal distention.  Genitourinary: Negative.   Musculoskeletal: Positive for back pain (in his T-spine). Negative for myalgias, joint swelling, arthralgias and gait problem.  Skin: Negative for color change, pallor, rash and wound.  Neurological: Negative.  Negative for headaches.  Hematological: Negative for adenopathy. Does not bruise/bleed easily.  Psychiatric/Behavioral: Negative.        Objective:   Physical Exam  Vitals reviewed. Constitutional: He is oriented to person, place, and time. He appears well-developed and well-nourished. No distress.  HENT:  Head: Normocephalic and atraumatic.  Mouth/Throat: Oropharynx is  clear and moist. No oropharyngeal exudate.  Eyes: Conjunctivae are normal. Right eye exhibits no discharge. Left eye exhibits no discharge. No scleral icterus.  Neck: Normal range of motion. Neck supple. No JVD present. No tracheal deviation present. No thyromegaly present.  Cardiovascular: Normal rate, regular rhythm, normal heart sounds and intact distal pulses.  Exam reveals no gallop and no friction rub.   No murmur heard. Pulmonary/Chest: Effort normal and breath sounds normal. No stridor. No respiratory distress. He has no wheezes. He has no rales. He exhibits no tenderness.  Abdominal: Soft. Bowel sounds are normal. He exhibits no distension and no mass. There is no tenderness. There is no rebound and no guarding.  Musculoskeletal: Normal range of motion. He exhibits no edema and no tenderness.       Cervical back: Normal. He exhibits normal range of motion, no tenderness, no bony tenderness, no swelling, no edema, no deformity, no laceration, no pain, no spasm and normal pulse.  Lymphadenopathy:    He has no cervical adenopathy.  Neurological: He is oriented to person, place, and time.  Skin: Skin is warm and dry. No rash noted. He is not diaphoretic. No erythema. No pallor.  Psychiatric: He has a normal mood and affect. His behavior is normal. Judgment and thought content normal.      Dg Cervical Spine Complete  06/10/2011  *RADIOLOGY REPORT*  Clinical Data: 66 year old male with left side neck pain around the year.  CERVICAL SPINE - COMPLETE 4+ VIEW  Comparison: Cervical MRI 08/14/2010.  Findings: Stable cervical vertebral height alignment, including trace retrolisthesis of C4 on C5.  Disc space narrowing endplate osteophytes from C4-C5 to C6-V7 again noted.  Prevertebral soft tissue contours are within normal limits. Bilateral posterior element alignment is within normal limits.  Uncovertebral hypertrophy maximal at C4-C5 and C5-C6 bilaterally. Cervicothoracic junction  alignment is  within normal limits.  AP alignment and lung apices within normal limits.  C1-C2 alignment and odontoid within normal limits.  IMPRESSION: No acute osseous abnormality in the cervical spine.  Chronic C4-C5 through C6-C7 disc degeneration.  Original Report Authenticated By: Martin [name], M.D.   Dg Thoracic Spine W/swimmers  06/10/2011  *RADIOLOGY REPORT*  Clinical Data: 66 year old male with upper back pain.  THORACIC SPINE - 2 VIEW + SWIMMERS  Comparison: Portable chest radiograph 06/08/2005.  Findings: Normal thoracic segmentation.  Moderate dextroconvex lower thoracic scoliosis, apex at T10-T11.  On the lateral view there is no spondylolisthesis and disc spaces appear relatively preserved. Cervicothoracic junction alignment is within normal limits.  Grossly stable visualized thoracic visceral contours.  IMPRESSION: Moderate thoracic scoliosis. No acute osseous abnormality in the thoracic spine.  Original Report Authenticated By: Martin [name], M.D.     Assessment & Plan:

## 2011-06-24 NOTE — Patient Instructions (Signed)
Hypertriglyceridemia  Diet for High blood levels of Triglycerides Most fats in food are triglycerides. Triglycerides in your blood are stored as fat in your body. High levels of triglycerides in your blood may put you at a greater risk for heart disease and stroke.  Normal triglyceride levels are less than 150 mg/dL. Borderline high levels are 150-199 mg/dl. High levels are 200 - 499 mg/dL, and very high triglyceride levels are greater than 500 mg/dL. The decision to treat high triglycerides is generally based on the level. For people with borderline or high triglyceride levels, treatment includes weight loss and exercise. Drugs are recommended for people with very high triglyceride levels. Many people who need treatment for high triglyceride levels have metabolic syndrome. This syndrome is a collection of disorders that often include: insulin resistance, high blood pressure, blood clotting problems, high cholesterol and triglycerides. TESTING PROCEDURE FOR TRIGLYCERIDES  You should not eat 4 hours before getting your triglycerides measured. The normal range of triglycerides is between 10 and 250 milligrams per deciliter (mg/dl). Some people may have extreme levels (1000 or above), but your triglyceride level may be too high if it is above 150 mg/dl, depending on what other risk factors you have for heart disease.   People with high blood triglycerides may also have high blood cholesterol levels. If you have high blood cholesterol as well as high blood triglycerides, your risk for heart disease is probably greater than if you only had high triglycerides. High blood cholesterol is one of the main risk factors for heart disease.  CHANGING YOUR DIET  Your weight can affect your blood triglyceride level. If you are more than 20% above your ideal body weight, you may be able to lower your blood triglycerides by losing weight. Eating less and exercising regularly is the best way to combat this. Fat provides  more calories than any other food. The best way to lose weight is to eat less fat. Only 30% of your total calories should come from fat. Less than 7% of your diet should come from saturated fat. A diet low in fat and saturated fat is the same as a diet to decrease blood cholesterol. By eating a diet lower in fat, you may lose weight, lower your blood cholesterol, and lower your blood triglyceride level.  Eating a diet low in fat, especially saturated fat, may also help you lower your blood triglyceride level. Ask your dietitian to help you figure how much fat you can eat based on the number of calories your caregiver has prescribed for you.  Exercise, in addition to helping with weight loss may also help lower triglyceride levels.   Alcohol can increase blood triglycerides. You may need to stop drinking alcoholic beverages.   Too much carbohydrate in your diet may also increase your blood triglycerides. Some complex carbohydrates are necessary in your diet. These may include bread, rice, potatoes, other starchy vegetables and cereals.   Reduce "simple" carbohydrates. These may include pure sugars, candy, honey, and jelly without losing other nutrients. If you have the kind of high blood triglycerides that is affected by the amount of carbohydrates in your diet, you will need to eat less sugar and less high-sugar foods. Your caregiver can help you with this.   Adding 2-4 grams of fish oil (EPA+ DHA) may also help lower triglycerides. Speak with your caregiver before adding any supplements to your regimen.  Following the Diet  Maintain your ideal weight. Your caregivers can help you with a diet. Generally,   eating less food and getting more exercise will help you lose weight. Joining a weight control group may also help. Ask your caregivers for a good weight control group in your area.  Eat low-fat foods instead of high-fat foods. This can help you lose weight too.  These foods are lower in fat. Eat MORE  of these:   Dried beans, peas, and lentils.   Egg whites.   Low-fat cottage cheese.   Fish.   Lean cuts of meat, such as round, sirloin, rump, and flank (cut extra fat off meat you fix).   Whole grain breads, cereals and pasta.   Skim and nonfat dry milk.   Low-fat yogurt.   Poultry without the skin.   Cheese made with skim or part-skim milk, such as mozzarella, parmesan, farmers', ricotta, or pot cheese.  These are higher fat foods. Eat LESS of these:   Whole milk and foods made from whole milk, such as American, blue, cheddar, monterey jack, and swiss cheese   High-fat meats, such as luncheon meats, sausages, knockwurst, bratwurst, hot dogs, ribs, corned beef, ground pork, and regular ground beef.   Fried foods.  Limit saturated fats in your diet. Substituting unsaturated fat for saturated fat may decrease your blood triglyceride level. You will need to read package labels to know which products contain saturated fats.  These foods are high in saturated fat. Eat LESS of these:   Fried pork skins.   Whole milk.   Skin and fat from poultry.   Palm oil.   Butter.   Shortening.   Cream cheese.   Bacon.   Margarines and baked goods made from listed oils.   Vegetable shortenings.   Chitterlings.   Fat from meats.   Coconut oil.   Palm kernel oil.   Lard.   Cream.   Sour cream.   Fatback.   Coffee whiteners and non-dairy creamers made with these oils.   Cheese made from whole milk.  Use unsaturated fats (both polyunsaturated and monounsaturated) moderately. Remember, even though unsaturated fats are better than saturated fats; you still want a diet low in total fat.  These foods are high in unsaturated fat:   Canola oil.   Sunflower oil.   Mayonnaise.   Almonds.   Peanuts.   Pine nuts.   Margarines made with these oils.   Safflower oil.   Olive oil.   Avocados.   Cashews.   Peanut butter.   Sunflower seeds.   Soybean oil.     Peanut oil.   Olives.   Pecans.   Walnuts.   Pumpkin seeds.  Avoid sugar and other high-sugar foods. This will decrease carbohydrates without decreasing other nutrients. Sugar in your food goes rapidly to your blood. When there is excess sugar in your blood, your liver may use it to make more triglycerides. Sugar also contains calories without other important nutrients.  Eat LESS of these:   Sugar, brown sugar, powdered sugar, jam, jelly, preserves, honey, syrup, molasses, pies, candy, cakes, cookies, frosting, pastries, colas, soft drinks, punches, fruit drinks, and regular gelatin.   Avoid alcohol. Alcohol, even more than sugar, may increase blood triglycerides. In addition, alcohol is high in calories and low in nutrients. Ask for sparkling water, or a diet soft drink instead of an alcoholic beverage.  Suggestions for planning and preparing meals   Bake, broil, grill or roast meats instead of frying.   Remove fat from meats and skin from poultry before cooking.   Add spices,   herbs, lemon juice or vinegar to vegetables instead of salt, rich sauces or gravies.   Use a non-stick skillet without fat or use no-stick sprays.   Cool and refrigerate stews and broth. Then remove the hardened fat floating on the surface before serving.   Refrigerate meat drippings and skim off fat to make low-fat gravies.   Serve more fish.   Use less butter, margarine and other high-fat spreads on bread or vegetables.   Use skim or reconstituted non-fat dry milk for cooking.   Cook with low-fat cheeses.   Substitute low-fat yogurt or cottage cheese for all or part of the sour cream in recipes for sauces, dips or congealed salads.   Use half yogurt/half mayonnaise in salad recipes.   Substitute evaporated skim milk for cream. Evaporated skim milk or reconstituted non-fat dry milk can be whipped and substituted for whipped cream in certain recipes.   Choose fresh fruits for dessert instead of  high-fat foods such as pies or cakes. Fruits are naturally low in fat.  When Dining Out   Order low-fat appetizers such as fruit or vegetable juice, pasta with vegetables or tomato sauce.   Select clear, rather than cream soups.   Ask that dressings and gravies be served on the side. Then use less of them.   Order foods that are baked, broiled, poached, steamed, stir-fried, or roasted.   Ask for margarine instead of butter, and use only a small amount.   Drink sparkling water, unsweetened tea or coffee, or diet soft drinks instead of alcohol or other sweet beverages.  QUESTIONS AND ANSWERS ABOUT OTHER FATS IN THE BLOOD: SATURATED FAT, TRANS FAT, AND CHOLESTEROL What is trans fat? Trans fat is a type of fat that is formed when vegetable oil is hardened through a process called hydrogenation. This process helps makes foods more solid, gives them shape, and prolongs their shelf life. Trans fats are also called hydrogenated or partially hydrogenated oils.  What do saturated fat, trans fat, and cholesterol in foods have to do with heart disease? Saturated fat, trans fat, and cholesterol in the diet all raise the level of LDL "bad" cholesterol in the blood. The higher the LDL cholesterol, the greater the risk for coronary heart disease (CHD). Saturated fat and trans fat raise LDL similarly.  What foods contain saturated fat, trans fat, and cholesterol? High amounts of saturated fat are found in animal products, such as fatty cuts of meat, chicken skin, and full-fat dairy products like butter, whole milk, cream, and cheese, and in tropical vegetable oils such as palm, palm kernel, and coconut oil. Trans fat is found in some of the same foods as saturated fat, such as vegetable shortening, some margarines (especially hard or stick margarine), crackers, cookies, baked goods, fried foods, salad dressings, and other processed foods made with partially hydrogenated vegetable oils. Small amounts of trans fat  also occur naturally in some animal products, such as milk products, beef, and lamb. Foods high in cholesterol include liver, other organ meats, egg yolks, shrimp, and full-fat dairy products. How can I use the new food label to make heart-healthy food choices? Check the Nutrition Facts panel of the food label. Choose foods lower in saturated fat, trans fat, and cholesterol. For saturated fat and cholesterol, you can also use the Percent Daily Value (%DV): 5% DV or less is low, and 20% DV or more is high. (There is no %DV for trans fat.) Use the Nutrition Facts panel to choose foods low in   saturated fat and cholesterol, and if the trans fat is not listed, read the ingredients and limit products that list shortening or hydrogenated or partially hydrogenated vegetable oil, which tend to be high in trans fat. POINTS TO REMEMBER: YOU NEED A LITTLE TLC (THERAPEUTIC LIFESTYLE CHANGES)  Discuss your risk for heart disease with your caregivers, and take steps to reduce risk factors.   Change your diet. Choose foods that are low in saturated fat, trans fat, and cholesterol.   Add exercise to your daily routine if it is not already being done. Participate in physical activity of moderate intensity, like brisk walking, for at least 30 minutes on most, and preferably all days of the week. No time? Break the 30 minutes into three, 10-minute segments during the day.   Stop smoking. If you do smoke, contact your caregiver to discuss ways in which they can help you quit.   Do not use street drugs.   Maintain a normal weight.   Maintain a healthy blood pressure.   Keep up with your blood work for checking the fats in your blood as directed by your caregiver.  Document Released: 12/19/2003 Document Revised: 02/19/2011 Document Reviewed: 07/16/2008 [employer] Patient Information 2012 [employer].Hypertension As your heart beats, it forces blood through your arteries. This force is your blood pressure. If the  pressure is too high, it is called hypertension (HTN) or high blood pressure. HTN is dangerous because you may have it and not know it. High blood pressure may mean that your heart has to work harder to pump blood. Your arteries may be narrow or stiff. The extra work puts you at risk for heart disease, stroke, and other problems.  Blood pressure consists of two numbers, a higher number over a lower, 110/72, for example. It is stated as "110 over 72." The ideal is below 120 for the top number (systolic) and under 80 for the bottom (diastolic). Write down your blood pressure today. You should pay close attention to your blood pressure if you have certain conditions such as:  Heart failure.   Prior heart attack.   Diabetes   Chronic kidney disease.   Prior stroke.   Multiple risk factors for heart disease.  To see if you have HTN, your blood pressure should be measured while you are seated with your arm held at the level of the heart. It should be measured at least twice. A one-time elevated blood pressure reading (especially in the Emergency Department) does not mean that you need treatment. There may be conditions in which the blood pressure is different between your right and left arms. It is important to see your caregiver soon for a recheck. Most people have essential hypertension which means that there is not a specific cause. This type of high blood pressure may be lowered by changing lifestyle factors such as:  Stress.   Smoking.   Lack of exercise.   Excessive weight.   Drug/tobacco/alcohol use.   Eating less salt.  Most people do not have symptoms from high blood pressure until it has caused damage to the body. Effective treatment can often prevent, delay or reduce that damage. TREATMENT  When a cause has been identified, treatment for high blood pressure is directed at the cause. There are a large number of medications to treat HTN. These fall into several categories, and your  caregiver will help you select the medicines that are best for you. Medications may have side effects. You should review side effects with   your caregiver. If your blood pressure stays high after you have made lifestyle changes or started on medicines,   Your medication(s) may need to be changed.   Other problems may need to be addressed.   Be certain you understand your prescriptions, and know how and when to take your medicine.   Be sure to follow up with your caregiver within the time frame advised (usually within two weeks) to have your blood pressure rechecked and to review your medications.   If you are taking more than one medicine to lower your blood pressure, make sure you know how and at what times they should be taken. Taking two medicines at the same time can result in blood pressure that is too low.  SEEK IMMEDIATE MEDICAL CARE IF:  You develop a severe headache, blurred or changing vision, or confusion.   You have unusual weakness or numbness, or a faint feeling.   You have severe chest or abdominal pain, vomiting, or breathing problems.  MAKE SURE YOU:   Understand these instructions.   Will watch your condition.   Will get help right away if you are not doing well or get worse.  Document Released: 03/02/2005 Document Revised: 02/19/2011 Document Reviewed: 10/21/2007 [employer] Patient Information 2012 [employer]. 

## 2011-06-26 ENCOUNTER — Encounter: Payer: Self-pay | Admitting: Physical Medicine & Rehabilitation

## 2011-06-26 ENCOUNTER — Encounter: Payer: Self-pay | Admitting: Internal Medicine

## 2011-06-26 MED ORDER — OMEGA-3-ACID ETHYL ESTERS 1 G PO CAPS: 2.0000 g | ORAL_CAPSULE | Freq: Two times a day (BID) | ORAL | Status: DC

## 2011-06-26 MED ORDER — FENOFIBRATE 150 MG PO CAPS: 1.0000 | ORAL_CAPSULE | Freq: Every day | ORAL | Status: DC

## 2011-06-26 NOTE — Assessment & Plan Note (Signed)
Start vicodin, see notes about DDD

## 2011-06-26 NOTE — Assessment & Plan Note (Signed)
He is doing well on vytorin, I will check his FLP today

## 2011-06-26 NOTE — Progress Notes (Signed)
Addended by: Etta Grandchild on: 06/26/2011 08:51 AM   Modules accepted: Orders

## 2011-06-26 NOTE — Assessment & Plan Note (Signed)
Check his a1c today

## 2011-06-26 NOTE — Assessment & Plan Note (Signed)
I will check his PSA today 

## 2011-06-26 NOTE — Assessment & Plan Note (Signed)
His BP is well controlled, I will check his lytes and his renal function 

## 2011-06-26 NOTE — Assessment & Plan Note (Signed)
He has been referred to pain management and will try vicodin in addition to naprelan for the pain

## 2011-07-20 ENCOUNTER — Encounter: Payer: Self-pay | Admitting: Internal Medicine

## 2011-07-20 ENCOUNTER — Other Ambulatory Visit (INDEPENDENT_AMBULATORY_CARE_PROVIDER_SITE_OTHER): Payer: Medicare Other

## 2011-07-20 ENCOUNTER — Ambulatory Visit (INDEPENDENT_AMBULATORY_CARE_PROVIDER_SITE_OTHER): Payer: Medicare Other | Admitting: Internal Medicine

## 2011-07-20 VITALS — BP 134/76 | HR 77 | Temp 98.1°F | Resp 16 | Wt 207.5 lb

## 2011-07-20 DIAGNOSIS — M5030 Other cervical disc degeneration, unspecified cervical region: Secondary | ICD-10-CM

## 2011-07-20 DIAGNOSIS — E785 Hyperlipidemia, unspecified: Secondary | ICD-10-CM

## 2011-07-20 DIAGNOSIS — M542 Cervicalgia: Secondary | ICD-10-CM

## 2011-07-20 DIAGNOSIS — I10 Essential (primary) hypertension: Secondary | ICD-10-CM

## 2011-07-20 LAB — LIPID PANEL: VLDL: 98.8 mg/dL — ABNORMAL HIGH (ref 0.0–40.0)

## 2011-07-20 MED ORDER — HYDROCODONE-ACETAMINOPHEN 5-500 MG PO TABS: 2.0000 | ORAL_TABLET | Freq: Three times a day (TID) | ORAL | Status: AC | PRN

## 2011-07-20 NOTE — Patient Instructions (Signed)
Hypertriglyceridemia  Diet for High blood levels of Triglycerides Most fats in food are triglycerides. Triglycerides in your blood are stored as fat in your body. High levels of triglycerides in your blood may put you at a greater risk for heart disease and stroke.  Normal triglyceride levels are less than 150 mg/dL. Borderline high levels are 150-199 mg/dl. High levels are 200 - 499 mg/dL, and very high triglyceride levels are greater than 500 mg/dL. The decision to treat high triglycerides is generally based on the level. For people with borderline or high triglyceride levels, treatment includes weight loss and exercise. Drugs are recommended for people with very high triglyceride levels. Many people who need treatment for high triglyceride levels have metabolic syndrome. This syndrome is a collection of disorders that often include: insulin resistance, high blood pressure, blood clotting problems, high cholesterol and triglycerides. TESTING PROCEDURE FOR TRIGLYCERIDES  You should not eat 4 hours before getting your triglycerides measured. The normal range of triglycerides is between 10 and 250 milligrams per deciliter (mg/dl). Some people may have extreme levels (1000 or above), but your triglyceride level may be too high if it is above 150 mg/dl, depending on what other risk factors you have for heart disease.   People with high blood triglycerides may also have high blood cholesterol levels. If you have high blood cholesterol as well as high blood triglycerides, your risk for heart disease is probably greater than if you only had high triglycerides. High blood cholesterol is one of the main risk factors for heart disease.  CHANGING YOUR DIET  Your weight can affect your blood triglyceride level. If you are more than 20% above your ideal body weight, you may be able to lower your blood triglycerides by losing weight. Eating less and exercising regularly is the best way to combat this. Fat provides  more calories than any other food. The best way to lose weight is to eat less fat. Only 30% of your total calories should come from fat. Less than 7% of your diet should come from saturated fat. A diet low in fat and saturated fat is the same as a diet to decrease blood cholesterol. By eating a diet lower in fat, you may lose weight, lower your blood cholesterol, and lower your blood triglyceride level.  Eating a diet low in fat, especially saturated fat, may also help you lower your blood triglyceride level. Ask your dietitian to help you figure how much fat you can eat based on the number of calories your caregiver has prescribed for you.  Exercise, in addition to helping with weight loss may also help lower triglyceride levels.   Alcohol can increase blood triglycerides. You may need to stop drinking alcoholic beverages.   Too much carbohydrate in your diet may also increase your blood triglycerides. Some complex carbohydrates are necessary in your diet. These may include bread, rice, potatoes, other starchy vegetables and cereals.   Reduce "simple" carbohydrates. These may include pure sugars, candy, honey, and jelly without losing other nutrients. If you have the kind of high blood triglycerides that is affected by the amount of carbohydrates in your diet, you will need to eat less sugar and less high-sugar foods. Your caregiver can help you with this.   Adding 2-4 grams of fish oil (EPA+ DHA) may also help lower triglycerides. Speak with your caregiver before adding any supplements to your regimen.  Following the Diet  Maintain your ideal weight. Your caregivers can help you with a diet. Generally,   eating less food and getting more exercise will help you lose weight. Joining a weight control group may also help. Ask your caregivers for a good weight control group in your area.  Eat low-fat foods instead of high-fat foods. This can help you lose weight too.  These foods are lower in fat. Eat MORE  of these:   Dried beans, peas, and lentils.   Egg whites.   Low-fat cottage cheese.   Fish.   Lean cuts of meat, such as round, sirloin, rump, and flank (cut extra fat off meat you fix).   Whole grain breads, cereals and pasta.   Skim and nonfat dry milk.   Low-fat yogurt.   Poultry without the skin.   Cheese made with skim or part-skim milk, such as mozzarella, parmesan, farmers', ricotta, or pot cheese.  These are higher fat foods. Eat LESS of these:   Whole milk and foods made from whole milk, such as American, blue, cheddar, monterey jack, and swiss cheese   High-fat meats, such as luncheon meats, sausages, knockwurst, bratwurst, hot dogs, ribs, corned beef, ground pork, and regular ground beef.   Fried foods.  Limit saturated fats in your diet. Substituting unsaturated fat for saturated fat may decrease your blood triglyceride level. You will need to read package labels to know which products contain saturated fats.  These foods are high in saturated fat. Eat LESS of these:   Fried pork skins.   Whole milk.   Skin and fat from poultry.   Palm oil.   Butter.   Shortening.   Cream cheese.   Bacon.   Margarines and baked goods made from listed oils.   Vegetable shortenings.   Chitterlings.   Fat from meats.   Coconut oil.   Palm kernel oil.   Lard.   Cream.   Sour cream.   Fatback.   Coffee whiteners and non-dairy creamers made with these oils.   Cheese made from whole milk.  Use unsaturated fats (both polyunsaturated and monounsaturated) moderately. Remember, even though unsaturated fats are better than saturated fats; you still want a diet low in total fat.  These foods are high in unsaturated fat:   Canola oil.   Sunflower oil.   Mayonnaise.   Almonds.   Peanuts.   Pine nuts.   Margarines made with these oils.   Safflower oil.   Olive oil.   Avocados.   Cashews.   Peanut butter.   Sunflower seeds.   Soybean oil.     Peanut oil.   Olives.   Pecans.   Walnuts.   Pumpkin seeds.  Avoid sugar and other high-sugar foods. This will decrease carbohydrates without decreasing other nutrients. Sugar in your food goes rapidly to your blood. When there is excess sugar in your blood, your liver may use it to make more triglycerides. Sugar also contains calories without other important nutrients.  Eat LESS of these:   Sugar, brown sugar, powdered sugar, jam, jelly, preserves, honey, syrup, molasses, pies, candy, cakes, cookies, frosting, pastries, colas, soft drinks, punches, fruit drinks, and regular gelatin.   Avoid alcohol. Alcohol, even more than sugar, may increase blood triglycerides. In addition, alcohol is high in calories and low in nutrients. Ask for sparkling water, or a diet soft drink instead of an alcoholic beverage.  Suggestions for planning and preparing meals   Bake, broil, grill or roast meats instead of frying.   Remove fat from meats and skin from poultry before cooking.   Add spices,   herbs, lemon juice or vinegar to vegetables instead of salt, rich sauces or gravies.   Use a non-stick skillet without fat or use no-stick sprays.   Cool and refrigerate stews and broth. Then remove the hardened fat floating on the surface before serving.   Refrigerate meat drippings and skim off fat to make low-fat gravies.   Serve more fish.   Use less butter, margarine and other high-fat spreads on bread or vegetables.   Use skim or reconstituted non-fat dry milk for cooking.   Cook with low-fat cheeses.   Substitute low-fat yogurt or cottage cheese for all or part of the sour cream in recipes for sauces, dips or congealed salads.   Use half yogurt/half mayonnaise in salad recipes.   Substitute evaporated skim milk for cream. Evaporated skim milk or reconstituted non-fat dry milk can be whipped and substituted for whipped cream in certain recipes.   Choose fresh fruits for dessert instead of  high-fat foods such as pies or cakes. Fruits are naturally low in fat.  When Dining Out   Order low-fat appetizers such as fruit or vegetable juice, pasta with vegetables or tomato sauce.   Select clear, rather than cream soups.   Ask that dressings and gravies be served on the side. Then use less of them.   Order foods that are baked, broiled, poached, steamed, stir-fried, or roasted.   Ask for margarine instead of butter, and use only a small amount.   Drink sparkling water, unsweetened tea or coffee, or diet soft drinks instead of alcohol or other sweet beverages.  QUESTIONS AND ANSWERS ABOUT OTHER FATS IN THE BLOOD: SATURATED FAT, TRANS FAT, AND CHOLESTEROL What is trans fat? Trans fat is a type of fat that is formed when vegetable oil is hardened through a process called hydrogenation. This process helps makes foods more solid, gives them shape, and prolongs their shelf life. Trans fats are also called hydrogenated or partially hydrogenated oils.  What do saturated fat, trans fat, and cholesterol in foods have to do with heart disease? Saturated fat, trans fat, and cholesterol in the diet all raise the level of LDL "bad" cholesterol in the blood. The higher the LDL cholesterol, the greater the risk for coronary heart disease (CHD). Saturated fat and trans fat raise LDL similarly.  What foods contain saturated fat, trans fat, and cholesterol? High amounts of saturated fat are found in animal products, such as fatty cuts of meat, chicken skin, and full-fat dairy products like butter, whole milk, cream, and cheese, and in tropical vegetable oils such as palm, palm kernel, and coconut oil. Trans fat is found in some of the same foods as saturated fat, such as vegetable shortening, some margarines (especially hard or stick margarine), crackers, cookies, baked goods, fried foods, salad dressings, and other processed foods made with partially hydrogenated vegetable oils. Small amounts of trans fat  also occur naturally in some animal products, such as milk products, beef, and lamb. Foods high in cholesterol include liver, other organ meats, egg yolks, shrimp, and full-fat dairy products. How can I use the new food label to make heart-healthy food choices? Check the Nutrition Facts panel of the food label. Choose foods lower in saturated fat, trans fat, and cholesterol. For saturated fat and cholesterol, you can also use the Percent Daily Value (%DV): 5% DV or less is low, and 20% DV or more is high. (There is no %DV for trans fat.) Use the Nutrition Facts panel to choose foods low in   saturated fat and cholesterol, and if the trans fat is not listed, read the ingredients and limit products that list shortening or hydrogenated or partially hydrogenated vegetable oil, which tend to be high in trans fat. POINTS TO REMEMBER: YOU NEED A LITTLE TLC (THERAPEUTIC LIFESTYLE CHANGES)  Discuss your risk for heart disease with your caregivers, and take steps to reduce risk factors.   Change your diet. Choose foods that are low in saturated fat, trans fat, and cholesterol.   Add exercise to your daily routine if it is not already being done. Participate in physical activity of moderate intensity, like brisk walking, for at least 30 minutes on most, and preferably all days of the week. No time? Break the 30 minutes into three, 10-minute segments during the day.   Stop smoking. If you do smoke, contact your caregiver to discuss ways in which they can help you quit.   Do not use street drugs.   Maintain a normal weight.   Maintain a healthy blood pressure.   Keep up with your blood work for checking the fats in your blood as directed by your caregiver.  Document Released: 12/19/2003 Document Revised: 02/19/2011 Document Reviewed: 07/16/2008 [employer] Patient Information 2012 [employer]. 

## 2011-07-20 NOTE — Assessment & Plan Note (Signed)
He is doing well on his current regimen for pain

## 2011-07-20 NOTE — Progress Notes (Signed)
Subjective:    Patient ID: Martin [name], male    DOB: 16-Nov-1945, 66 y.o.   MRN: [id number]  Hyperlipidemia This is a chronic problem. The current episode started more than 1 year ago. The problem is uncontrolled. Recent lipid tests were reviewed and are variable. Exacerbating diseases include obesity. He has no history of chronic renal disease, diabetes, hypothyroidism, liver disease or nephrotic syndrome. Factors aggravating his hyperlipidemia include fatty foods. Pertinent negatives include no chest pain, focal sensory loss, focal weakness, leg pain, myalgias or shortness of breath. Current antihyperlipidemic treatment includes statins, nicotinic acid and fibric acid derivatives. The current treatment provides moderate improvement of lipids. Compliance problems include adherence to exercise and adherence to diet.   Neck Pain  This is a recurrent problem. The current episode started more than 1 month ago. The problem occurs intermittently. The problem has been unchanged. The pain is associated with nothing. The pain is present in the midline. The quality of the pain is described as aching. The pain is at a severity of 4/10. The pain is mild. The symptoms are aggravated by twisting. Stiffness is present in the morning. Pertinent negatives include no chest pain, fever, headaches, leg pain, numbness, pain with swallowing, paresis, photophobia, syncope, tingling, trouble swallowing, visual change, weakness or weight loss. He has tried acetaminophen, NSAIDs and oral narcotics for the symptoms. The treatment provided moderate relief.      Review of Systems  Constitutional: Negative for fever, chills, weight loss, diaphoresis, activity change, appetite change, fatigue and unexpected weight change.  HENT: Positive for neck pain. Negative for trouble swallowing.   Eyes: Negative.  Negative for photophobia.  Respiratory: Negative for cough, chest tightness, shortness of breath, wheezing and stridor.     Cardiovascular: Negative for chest pain, palpitations, leg swelling and syncope.  Gastrointestinal: Negative for nausea, vomiting, abdominal pain, diarrhea, constipation, blood in stool and abdominal distention.  Genitourinary: Negative for dysuria, urgency, frequency, hematuria, flank pain, decreased urine volume, enuresis and difficulty urinating.  Musculoskeletal: Negative for myalgias, back pain, joint swelling, arthralgias and gait problem.  Skin: Negative for color change, pallor, rash and wound.  Neurological: Negative for dizziness, tingling, tremors, focal weakness, seizures, syncope, facial asymmetry, speech difficulty, weakness, light-headedness, numbness and headaches.  Hematological: Negative for adenopathy. Does not bruise/bleed easily.  Psychiatric/Behavioral: Negative.        Objective:   Physical Exam  Vitals reviewed. Constitutional: He is oriented to person, place, and time. He appears well-developed and well-nourished. No distress.  HENT:  Head: Normocephalic and atraumatic.  Mouth/Throat: Oropharynx is clear and moist. No oropharyngeal exudate.  Eyes: Conjunctivae are normal. Right eye exhibits no discharge. Left eye exhibits no discharge. No scleral icterus.  Neck: Normal range of motion. Neck supple. No JVD present. No tracheal deviation present. No thyromegaly present.  Cardiovascular: Normal rate, regular rhythm, normal heart sounds and intact distal pulses.  Exam reveals no gallop and no friction rub.   No murmur heard. Pulmonary/Chest: Effort normal and breath sounds normal. No stridor. No respiratory distress. He has no wheezes. He has no rales. He exhibits no tenderness.  Abdominal: Soft. Bowel sounds are normal. He exhibits no distension and no mass. There is no tenderness. There is no rebound and no guarding.  Musculoskeletal: Normal range of motion. He exhibits no edema and no tenderness.       Cervical back: Normal. He exhibits normal range of motion, no  tenderness, no bony tenderness, no swelling, no edema, no deformity, no laceration, no  pain, no spasm and normal pulse.  Lymphadenopathy:    He has no cervical adenopathy.  Neurological: He is alert and oriented to person, place, and time. He has normal reflexes. He displays normal reflexes. No cranial nerve deficit. He exhibits normal muscle tone. Coordination normal.  Skin: Skin is warm and dry. No rash noted. He is not diaphoretic. No erythema. No pallor.  Psychiatric: He has a normal mood and affect. His behavior is normal. Judgment and thought content normal.    Dg Cervical Spine Complete  06/10/2011  *RADIOLOGY REPORT*  Clinical Data: 66 year old male with left side neck pain around the year.  CERVICAL SPINE - COMPLETE 4+ VIEW  Comparison: Cervical MRI 08/14/2010.  Findings: Stable cervical vertebral height alignment, including trace retrolisthesis of C4 on C5.  Disc space narrowing endplate osteophytes from C4-C5 to C6-V7 again noted.  Prevertebral soft tissue contours are within normal limits. Bilateral posterior element alignment is within normal limits.  Uncovertebral hypertrophy maximal at C4-C5 and C5-C6 bilaterally. Cervicothoracic junction alignment is within normal limits.  AP alignment and lung apices within normal limits.  C1-C2 alignment and odontoid within normal limits.  IMPRESSION: No acute osseous abnormality in the cervical spine.  Chronic C4-C5 through C6-C7 disc degeneration.  Original Report Authenticated By: Martin [name], M.D.   Dg Thoracic Spine W/swimmers  06/10/2011  *RADIOLOGY REPORT*  Clinical Data: 66 year old male with upper back pain.  THORACIC SPINE - 2 VIEW + SWIMMERS  Comparison: Portable chest radiograph 06/08/2005.  Findings: Normal thoracic segmentation.  Moderate dextroconvex lower thoracic scoliosis, apex at T10-T11.  On the lateral view there is no spondylolisthesis and disc spaces appear relatively preserved. Cervicothoracic junction alignment is within  normal limits.  Grossly stable visualized thoracic visceral contours.  IMPRESSION: Moderate thoracic scoliosis. No acute osseous abnormality in the thoracic spine.  Original Report Authenticated By: Martin [name], M.D.   Lab Results  Component Value Date   WBC 7.5 06/24/2011   HGB 15.7 06/24/2011   HCT 46.2 06/24/2011   PLT 268.0 06/24/2011   GLUCOSE 141* 06/24/2011   CHOL 299* 06/24/2011   TRIG 872.0* 06/24/2011   HDL 40.00 06/24/2011   LDLDIRECT 142.0 06/24/2011   ALT 35 06/24/2011   AST 32 06/24/2011   NA 145 06/24/2011   K 3.8 06/24/2011   CL 103 06/24/2011   CREATININE 0.9 06/24/2011   BUN 18 06/24/2011   CO2 29 06/24/2011   TSH 3.45 06/24/2011   PSA 0.68 06/24/2011   INR 0.96 06/03/2010   HGBA1C 6.1 06/24/2011      Assessment & Plan:

## 2011-07-20 NOTE — Assessment & Plan Note (Signed)
He has an appt. Soon with pain management

## 2011-07-20 NOTE — Assessment & Plan Note (Signed)
His BP is well controlled, I will check his lytes and renal function today 

## 2011-07-20 NOTE — Assessment & Plan Note (Signed)
He is doing well on his current regimen, I will recheck his FLP today 

## 2011-07-24 ENCOUNTER — Encounter: Payer: Medicare Other | Attending: Physical Medicine & Rehabilitation

## 2011-07-24 ENCOUNTER — Encounter: Payer: Self-pay | Admitting: Physical Medicine & Rehabilitation

## 2011-07-24 ENCOUNTER — Ambulatory Visit (HOSPITAL_BASED_OUTPATIENT_CLINIC_OR_DEPARTMENT_OTHER): Payer: Medicare Other | Admitting: Physical Medicine & Rehabilitation

## 2011-07-24 VITALS — BP 145/89 | HR 90 | Resp 14 | Ht 68.0 in | Wt 206.0 lb

## 2011-07-24 DIAGNOSIS — E785 Hyperlipidemia, unspecified: Secondary | ICD-10-CM | POA: Insufficient documentation

## 2011-07-24 DIAGNOSIS — M47812 Spondylosis without myelopathy or radiculopathy, cervical region: Secondary | ICD-10-CM | POA: Insufficient documentation

## 2011-07-24 DIAGNOSIS — M4120 Other idiopathic scoliosis, site unspecified: Secondary | ICD-10-CM | POA: Insufficient documentation

## 2011-07-24 DIAGNOSIS — IMO0001 Reserved for inherently not codable concepts without codable children: Secondary | ICD-10-CM | POA: Insufficient documentation

## 2011-07-24 DIAGNOSIS — M7918 Myalgia, other site: Secondary | ICD-10-CM | POA: Insufficient documentation

## 2011-07-24 DIAGNOSIS — N400 Enlarged prostate without lower urinary tract symptoms: Secondary | ICD-10-CM | POA: Insufficient documentation

## 2011-07-24 DIAGNOSIS — I10 Essential (primary) hypertension: Secondary | ICD-10-CM | POA: Insufficient documentation

## 2011-07-24 DIAGNOSIS — M542 Cervicalgia: Secondary | ICD-10-CM

## 2011-07-24 MED ORDER — CYCLOBENZAPRINE HCL 5 MG PO TABS: 5.0000 mg | ORAL_TABLET | Freq: Every evening | ORAL | Status: AC | PRN

## 2011-07-24 NOTE — Progress Notes (Signed)
Subjective:    Patient ID: Martin [name], male    DOB: 1945-07-10, 67 y.o.   MRN: [id number]  HPI 66 year old male with neck and upper back pain gradually increasing over the last several months. He has a history of scoliosis starting in childhood. He has never had bracing. He's never had physical therapy. He has neck pain and has undergone x-rays of his neck. We did review these and this mainly showed some C5-C6 spondylosis. He also had an MRI of his neck proximally 2 years ago which showed moderate stenosis at C5-C6. He denies any upper extremity numbness or t does have a history of right shoulder problems and has gone on to surgery for some type of decompression. He has no lower extremity symptoms. He is independent with all self-care and mobility ingling. He does have left parascapular pain.  Past medical history significant for hyperlipidemia, hypertension, benign prostatic hypertrophy Pain Inventory Average Pain 7 Pain Right Now 5 My pain is aching  In the last 24 hours, has pain interfered with the following? General activity 5 Relation with others 6 Enjoyment of life 6 What TIME of day is your pain at its worst? night Sleep (in general) Fair  Pain is worse with: walking, sitting and standing Pain improves with: rest Relief from Meds: 6  Mobility walk without assistance how many minutes can you walk? 20 min ability to climb steps?  yes do you drive?  yes Do you have any goals in this area?  yes  Function retired  Neuro/Psych weakness  Prior Studies Any changes since last visit?  no  Physicians involved in your care Primary care Sanda Linger       Review of Systems  HENT: Negative.   Eyes: Negative.   Respiratory: Negative.   Cardiovascular: Negative.   Gastrointestinal: Negative.   Genitourinary: Negative.   Musculoskeletal: Negative.   Skin: Negative.   Neurological: Positive for weakness.  Hematological: Negative.   Psychiatric/Behavioral:  Negative.        Objective:   Physical Exam  Constitutional: He is oriented to person, place, and time. He appears well-developed and well-nourished.  HENT:  Head: Normocephalic and atraumatic.  Eyes: Conjunctivae are normal.  Musculoskeletal:       Cervical back: He exhibits tenderness. He exhibits normal range of motion.       Tenderness at the mastoid on the left side at the insertion of the longissimus capitis muscle. There is also tenderness along the insertional sites of the left rhomboid muscles. There is an obvious thoracic scoliosis convex to the right maximal at T9-10. Lower extremity strength is normal lower extremity deep tendon reflexes are normal Straight leg raising test is negative  Neurological: He is alert and oriented to person, place, and time. He has normal strength. Gait normal.  Reflex Scores:      Tricep reflexes are 2+ on the right side and 2+ on the left side.      Bicep reflexes are 2+ on the right side and 2+ on the left side.      Brachioradialis reflexes are 2+ on the right side and 2+ on the left side.      Patellar reflexes are 2+ on the right side and 2+ on the left side.      Achilles reflexes are 2+ on the right side and 2+ on the left side. Psychiatric: His affect is blunt. His speech is delayed.          Assessment & Plan:  1. Cervical spondylosis with mild central stenosis but no radicular symptoms. 2. Myofascial pain syndrome I think this is multifactorial. The main factor is his chronic thoracic scoliosis as well as poor posture with tendency towards kyphosis. I believe he would benefit from physical therapy and have made a referral for this. I've also written for cyclobenzaprine 5 mg each bedtime. Overall I don't think this will require much in terms of medication management. He not think he will require narcotic analgesics. I'll see him back after his physical therapy. May consider trigger point injections.

## 2011-07-24 NOTE — Patient Instructions (Signed)
Myofascial Pain Syndrome  Myofascial pain syndrome is a pain disorder. This pain may be felt in the muscles. It may come and go. Myofascial pain syndrome always has trigger or tender points in the muscle that will cause pain when pressed.    CAUSES  Myofascial pain may be caused by injuries, especially auto accidents, or by overuse of certain muscles. Typically the pain is long lasting. It is made worse by overuse of the involved muscles, emotional distress, and by cold, damp weather. Myofascial pain syndrome often develops in patients whose response to stress is an increase in muscle tone, and is seen in greater frequency in patients with pre-existing tension headaches.  SYMPTOMS    Myofascial pain syndrome causes a wide variety of symptoms. You may see tight ropy bands of muscle. Problems may also include aching, cramping, burning, numbness, tingling, and other uncomfortable sensations in muscular areas. It most commonly affects the neck, upper back, and shoulder areas. Pain often radiates into the arms and hands.    TREATMENT   Treatment includes resting the affected muscular area and applying ice packs to reduce spasm and pain. Trigger point injection, is a valuable initial therapy. This therapy is an injection of local anesthetic directly into the trigger point. Trigger points are often present at the source of pain. Pain relief following injection confirms the diagnosis of myofascial pain syndrome. Fairly vigorous therapy can be carried out during the pain-free period after each injection. Stretching exercises to loosen up the muscles are also useful. Transcutaneous electrical nerve stimulation (TENS) may provide relief from pain. TENS is the use of electric current produced by a device to stimulate the nerves. Ultrasound therapy applied directly over the affected muscle may also provide pain relief. Anti-inflammatory pain medicine can be helpful. Symptoms will gradually improve over a period of weeks to months with proper treatment.  HOME CARE INSTRUCTIONS  Call your caregiver for follow-up care as recommended.    SEEK MEDICAL CARE IF:    Your pain is severe and not helped with medications.  Document Released: 04/09/2004 Document Revised: 02/19/2011 Document Reviewed: 04/18/2010  [employer] Patient Information 2012 [employer].

## 2011-08-03 ENCOUNTER — Telehealth: Payer: Self-pay

## 2011-08-03 DIAGNOSIS — E785 Hyperlipidemia, unspecified: Secondary | ICD-10-CM

## 2011-08-03 MED ORDER — FENOFIBRATE 145 MG PO TABS: 145.0000 mg | ORAL_TABLET | Freq: Every day | ORAL | Status: DC

## 2011-08-03 NOTE — Telephone Encounter (Signed)
Received fax from pharmacy stating that lipofen is too expensive and request that this is changed to something generic thanks

## 2011-08-03 NOTE — Telephone Encounter (Signed)
done

## 2011-08-04 ENCOUNTER — Ambulatory Visit: Payer: Medicare Other | Admitting: Internal Medicine

## 2011-08-17 ENCOUNTER — Encounter: Payer: Medicare Other | Attending: Physical Medicine & Rehabilitation

## 2011-08-17 ENCOUNTER — Ambulatory Visit (HOSPITAL_BASED_OUTPATIENT_CLINIC_OR_DEPARTMENT_OTHER): Payer: Medicare Other | Admitting: Physical Medicine & Rehabilitation

## 2011-08-17 ENCOUNTER — Encounter: Payer: Self-pay | Admitting: Physical Medicine & Rehabilitation

## 2011-08-17 VITALS — BP 152/85 | HR 82 | Ht 68.0 in | Wt 206.0 lb

## 2011-08-17 DIAGNOSIS — IMO0001 Reserved for inherently not codable concepts without codable children: Secondary | ICD-10-CM | POA: Insufficient documentation

## 2011-08-17 DIAGNOSIS — M542 Cervicalgia: Secondary | ICD-10-CM | POA: Insufficient documentation

## 2011-08-17 DIAGNOSIS — M4120 Other idiopathic scoliosis, site unspecified: Secondary | ICD-10-CM | POA: Insufficient documentation

## 2011-08-17 DIAGNOSIS — E785 Hyperlipidemia, unspecified: Secondary | ICD-10-CM | POA: Insufficient documentation

## 2011-08-17 DIAGNOSIS — M47812 Spondylosis without myelopathy or radiculopathy, cervical region: Secondary | ICD-10-CM | POA: Insufficient documentation

## 2011-08-17 DIAGNOSIS — I10 Essential (primary) hypertension: Secondary | ICD-10-CM | POA: Insufficient documentation

## 2011-08-17 DIAGNOSIS — M7918 Myalgia, other site: Secondary | ICD-10-CM

## 2011-08-17 DIAGNOSIS — N400 Enlarged prostate without lower urinary tract symptoms: Secondary | ICD-10-CM | POA: Insufficient documentation
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
## 2011-08-17 NOTE — Patient Instructions (Signed)
We injected the longissimus capitis muscle today I have asked our PA Clydie Braun to show you some exercises for this You will see her in one month for followup If it is not any better I would like you to go to physical therapy 3 times per week for 3 weeks I don't think any type of strong pain medication is needed in this situation

## 2011-08-24 ENCOUNTER — Ambulatory Visit: Payer: Medicare Other | Admitting: Physical Medicine & Rehabilitation

## 2011-09-08 ENCOUNTER — Telehealth: Payer: Self-pay | Admitting: Internal Medicine

## 2011-09-08 NOTE — Telephone Encounter (Signed)
Caller: Martin [name]/Patient; PCP: Sanda Linger; CB#: [phone]; ; ; Call regarding Wants Urine Results Done 07/24/11; states he gave the urine to the office at [facility], but the results were sent to Dr. Wynn Banker instead of Dr. Yetta Barre.  Patient is upset about this, and wants to discuss with someone.  States was billed $94.00 for a test he thought was to diagnose a problem with his neck/behind the ear, not a urine screen for drugs.  States no one told him what the urine test was for, and he feels this was done "behind his back," and was not related to his chief complaint of neck pain.  Wants to know the answer to this and wants detailed explanation as to what all the tests were for and how it relates to his neck problem.   Advised per Epic that urine testiing was ordered by Dr. Wynn Banker, not Dr. Yetta Barre.   INFO TO OFFICE FOR PROVIDER REVIEW/CALLBACK.   MAY REACH PATIENT AT [phone].

## 2011-09-14 ENCOUNTER — Encounter: Payer: Medicare Other | Admitting: Physical Medicine and Rehabilitation

## 2011-10-17 ENCOUNTER — Ambulatory Visit (INDEPENDENT_AMBULATORY_CARE_PROVIDER_SITE_OTHER): Payer: Medicare Other | Admitting: Emergency Medicine

## 2011-10-17 VITALS — BP 176/100 | HR 82 | Temp 97.6°F | Resp 18

## 2011-10-17 DIAGNOSIS — R079 Chest pain, unspecified: Secondary | ICD-10-CM

## 2011-10-17 DIAGNOSIS — F411 Generalized anxiety disorder: Secondary | ICD-10-CM

## 2011-10-17 NOTE — Progress Notes (Signed)
Subjective:    Patient ID: Martin [name], male    DOB: [birth year], 66 y.o.   MRN: [id number]  HPI Comments: Awoke at 0500 with SOB anxiety and chest pain.  Multiple issues surrounding his elderly mother with whom he lives and cares for.  Pain diminished before ER and gone in ER   Discussed his situation with him extensively and will get a stress test to exclude CAD.    Shortness of Breath This is a new problem. The current episode started today. The problem occurs constantly. The problem has been gradually improving. The average episode lasts 5 hours. Associated symptoms include chest pain. Pertinent negatives include no abdominal pain, claudication, coryza, ear pain, fever, headaches, hemoptysis, leg pain, leg swelling, neck pain, PND, rash, rhinorrhea, sore throat, sputum production, swollen glands, syncope, vomiting or wheezing. The symptoms are aggravated by emotional upset. Risk factors include no known risk factors. He has tried nothing for the symptoms. There is no history of allergies, aspirin allergies, asthma, bronchiolitis, chronic lung disease, COPD, DVT, a heart failure, PE, pneumonia or a recent surgery.  Chest Pain  This is a new problem. The current episode started today. The onset quality is sudden. The problem occurs constantly. The problem has been gradually improving. The pain is present in the substernal region. The pain is at a severity of 4/10. The pain is moderate. The quality of the pain is described as dull and heavy. The pain does not radiate. Associated symptoms include shortness of breath. Pertinent negatives include no abdominal pain, back pain, claudication, cough, diaphoresis, dizziness, exertional chest pressure, fever, headaches, hemoptysis, irregular heartbeat, leg pain, lower extremity edema, malaise/fatigue, nausea, near-syncope, numbness, palpitations, PND, sputum production, syncope, vomiting or weakness. The pain is aggravated by emotional upset. He has tried  nothing for the symptoms. Risk factors include lack of exercise, male gender, obesity, sedentary lifestyle and stress.  His past medical history is significant for anxiety/panic attacks, hyperlipidemia and hypertension.  Pertinent negatives for past medical history include no aneurysm, no aortic aneurysm, no aortic dissection, no arrhythmia, no bicuspid aortic valve, no cancer, no congenital heart disease, no connective tissue disease, no COPD, no CHF, no diabetes, no DVT, no hyperhomocysteinemia, no Marfan's syndrome, no MI, no mitral valve prolapse, no pacemaker, no PE, no PVD, no recent injury, no rheumatic fever, no seizures, no sickle cell disease, no sleep apnea, no spontaneous pneumothorax, no stimulant use, no strokes, no thyroid problem, no TIA, Turner syndrome and no valve disorder.  His family medical history is significant for CAD in family, heart disease in family, hyperlipidemia in family and hypertension in family.  Pertinent negatives for family medical history include: family history of aortic dissection, no connective tissue disease in family, no diabetes in family, no Marfan's syndrome in family, no early MI in family, no PE in family, no PVD in family, no sickle cell disease in family, no stroke in family, no sudden death in family and no TIA in family.      Review of Systems  Constitutional: Positive for activity change and unexpected weight change. Negative for fever, malaise/fatigue and diaphoresis.  HENT: Negative.  Negative for ear pain, sore throat, rhinorrhea and neck pain.   Eyes: Negative.   Respiratory: Positive for shortness of breath. Negative for cough, hemoptysis, sputum production and wheezing.   Cardiovascular: Positive for chest pain. Negative for palpitations, claudication, leg swelling, syncope, PND and near-syncope.  Gastrointestinal: Negative.  Negative for nausea, vomiting and abdominal pain.  Genitourinary: Negative.  Musculoskeletal: Negative.  Negative  for back pain.  Skin: Negative for rash.  Neurological: Negative for dizziness, seizures, weakness, numbness and headaches.       Objective:   Physical Exam  Constitutional: He is oriented to person, place, and time. He appears well-developed and well-nourished.  HENT:  Head: Normocephalic and atraumatic.  Right Ear: External ear normal.  Left Ear: External ear normal.  Eyes: Conjunctivae are normal. Pupils are equal, round, and reactive to light. No scleral icterus.  Neck: Normal range of motion. Neck supple.  Cardiovascular: Normal rate, regular rhythm and normal heart sounds.   No murmur heard. Pulmonary/Chest: Effort normal and breath sounds normal.  Abdominal: Soft.  Musculoskeletal: Normal range of motion.  Neurological: He is alert and oriented to person, place, and time.  Skin: Skin is warm and dry.  Psychiatric: His mood appears anxious. He exhibits a depressed mood.          Assessment & Plan:  Chest pain and shortness of breath likely anxiety reaction  To see cardiology for stress test 911 if recurrs

## 2011-10-19 ENCOUNTER — Encounter: Payer: Self-pay | Admitting: Emergency Medicine

## 2011-11-02 ENCOUNTER — Other Ambulatory Visit: Payer: Self-pay | Admitting: Cardiology

## 2011-11-02 ENCOUNTER — Ambulatory Visit
Admission: RE | Admit: 2011-11-02 | Discharge: 2011-11-02 | Disposition: A | Discharge status: Home / Self Care | Payer: Medicare Other | Source: Ambulatory Visit | Attending: Cardiology | Admitting: Cardiology

## 2011-11-02 DIAGNOSIS — R0602 Shortness of breath: Secondary | ICD-10-CM

## 2011-11-06 ENCOUNTER — Ambulatory Visit (INDEPENDENT_AMBULATORY_CARE_PROVIDER_SITE_OTHER): Payer: Medicare Other | Admitting: Family Medicine

## 2011-11-06 ENCOUNTER — Encounter: Payer: Self-pay | Admitting: Family Medicine

## 2011-11-06 VITALS — BP 125/83 | HR 81 | Temp 97.6°F | Resp 16 | Ht 68.0 in | Wt 204.0 lb

## 2011-11-06 DIAGNOSIS — I10 Essential (primary) hypertension: Secondary | ICD-10-CM

## 2011-11-06 DIAGNOSIS — F411 Generalized anxiety disorder: Secondary | ICD-10-CM

## 2011-11-06 DIAGNOSIS — F32A Depression, unspecified: Secondary | ICD-10-CM

## 2011-11-06 DIAGNOSIS — F329 Major depressive disorder, single episode, unspecified: Secondary | ICD-10-CM

## 2011-11-06 DIAGNOSIS — Z6379 Other stressful life events affecting family and household: Secondary | ICD-10-CM

## 2011-11-06 DIAGNOSIS — F3289 Other specified depressive episodes: Secondary | ICD-10-CM

## 2011-11-06 DIAGNOSIS — F419 Anxiety disorder, unspecified: Secondary | ICD-10-CM

## 2011-11-06 MED ORDER — FLUTICASONE PROPIONATE 50 MCG/ACT NA SUSP: NASAL | Status: DC

## 2011-11-06 MED ORDER — ALPRAZOLAM 0.25 MG PO TABS: 0.2500 mg | ORAL_TABLET | Freq: Three times a day (TID) | ORAL | Status: DC | PRN

## 2011-11-06 NOTE — Progress Notes (Signed)
  Subjective:    Patient ID: Martin [name], male    DOB: [birth year], 66 y.o.   MRN: [id number]  HPI This 66 y.o. Cauc male who has HTN and anxiety, seen at [facility] earlier this month.  Fatigue and poor sleep hygiene- awakens at 3 AM and cannot get back to sleep. Pt thinks this is related to new heart med (Carvedilol).He has had no more nocturnal episodes of tightness or SOB.  Chronic anxiety and depression- lives with 13 y.o. Mother who has hearing impairment and other  chronic health issues. Mother cannot be convinced to get hearing aid. Pt feels at a loss about how to  handle mother's health issues and how to address question of dementia with her primary care  physician.  Brother lives in [place] and has only visited twice in 8 years per pt.  Relationship with sister is strained; they are separated by 10 years in age.  HTN- saw Dr. Jacinto Halim and is scheduled for ECHO as well as a stress test; had labs earlier this  week at [facility].    Review of Systems  Constitutional: Positive for fatigue. Negative for fever, diaphoresis, activity change and appetite change.  Respiratory: Negative for cough, chest tightness and shortness of breath.   Cardiovascular: Negative for chest pain and palpitations.  Gastrointestinal: Negative for nausea and abdominal pain.  Neurological: Negative for dizziness, weakness, numbness and headaches.  Psychiatric/Behavioral: Positive for disturbed wake/sleep cycle and dysphoric mood. Negative for suicidal ideas and confusion. The patient is nervous/anxious.        Objective:   Physical Exam  Nursing note and vitals reviewed. Constitutional: He is oriented to person, place, and time. He appears well-developed and well-nourished. No distress.  HENT:  Head: Normocephalic and atraumatic.  Eyes: Conjunctivae and EOM are normal. No scleral icterus.  Neck: No thyromegaly present.  Cardiovascular: Normal rate, regular rhythm and normal heart sounds.  Exam  reveals no gallop.   No murmur heard. Pulmonary/Chest: Effort normal and breath sounds normal. No respiratory distress. He has no wheezes.  Abdominal: Soft. He exhibits no distension and no mass. There is no hepatosplenomegaly. There is no tenderness. There is no guarding.  Musculoskeletal: Normal range of motion. He exhibits no edema.  Neurological: He is alert and oriented to person, place, and time. He has normal reflexes. No cranial nerve deficit. Coordination normal.  Skin: Skin is warm and dry.  Psychiatric: His speech is normal. Judgment and thought content normal. His mood appears anxious. His affect is not angry and not labile. He is slowed. He is not withdrawn. Cognition and memory are normal. He exhibits a depressed mood.          Assessment & Plan:   1. Depression  Significant familial stressors surrounding mother's health status- advised pt about questions that he may put to mother's physician to try to clarify what her diagnoses are; this will enable him to plan for her future. I also informed pt about the PACE program her in [place]- he thinks that this may be helpful.  2. Anxiety disorder  RX: Alprazolam 0.25 mg 1 tab tid prn anxiety; encouraged pt to take medication hs for good night's sleep.  3. HTN (hypertension) - stable on current medications. Follow-up with Dr. Jacinto Halim for scheduled tests  4. Stress due to illness of family member  As noted above; at end of our visit today, pt felt more hopeful and had some clarity about planning for mother

## 2011-11-27 ENCOUNTER — Other Ambulatory Visit: Payer: Self-pay | Admitting: Internal Medicine

## 2011-12-15 ENCOUNTER — Telehealth: Payer: Self-pay

## 2011-12-15 NOTE — Telephone Encounter (Signed)
This medication is on his med list but not prescribed by me; he needs to contact the physician/ provider who prescribed it in the past and request a refill. It looks like he was seeing Dr. Claudette Laws for Pain Management; if he cannot sleep because of chronic pain, I suggest he contact Pain Management.

## 2011-12-15 NOTE — Telephone Encounter (Signed)
Pt states he was in office to establish care with Dr. Audria Nine and would like to see if she would prescribe him hydrocondone to help him sleep at night. [phone]

## 2011-12-15 NOTE — Telephone Encounter (Signed)
Dr Audria Nine, I'm forwarding this to you for review.

## 2011-12-16 ENCOUNTER — Telehealth: Payer: Self-pay

## 2011-12-16 ENCOUNTER — Other Ambulatory Visit: Payer: Self-pay | Admitting: Internal Medicine

## 2011-12-16 NOTE — Telephone Encounter (Signed)
D/W pt that he had been going to a pain clinic in the recent past but pt stated he was not happy w/the MD there. Pt has been to see Dr Jacinto Halim and his tests were negative, but Dr Jacinto Halim wants him to see a pulmonologist and has appt for 10/10. Advised pt that he should RTC to see a provider for eval of his DDD and neck problems (MRI results of neck in EPIC), and we could refer him to an ortho or another pain clinic if needed. Pt agreed.

## 2011-12-16 NOTE — Telephone Encounter (Signed)
Called pt LMOM to CB. 

## 2011-12-16 NOTE — Telephone Encounter (Signed)
NEEDS REFILL ON HYDROCODONE. WAS NOT SURE IF HE NEEDED TO MAKE AN APPT. PLEASE CALL. OK TO LEAVE A MESSAGE  [facility] ON [address]

## 2011-12-17 ENCOUNTER — Ambulatory Visit (INDEPENDENT_AMBULATORY_CARE_PROVIDER_SITE_OTHER): Payer: Medicare Other | Admitting: Emergency Medicine

## 2011-12-17 VITALS — BP 130/86 | HR 72 | Temp 97.9°F | Resp 18 | Ht 68.0 in | Wt 199.0 lb

## 2011-12-17 DIAGNOSIS — F32A Depression, unspecified: Secondary | ICD-10-CM

## 2011-12-17 DIAGNOSIS — F329 Major depressive disorder, single episode, unspecified: Secondary | ICD-10-CM

## 2011-12-17 DIAGNOSIS — M5090 Cervical disc disorder, unspecified, unspecified cervical region: Secondary | ICD-10-CM

## 2011-12-17 DIAGNOSIS — F3289 Other specified depressive episodes: Secondary | ICD-10-CM

## 2011-12-17 MED ORDER — HYDROCODONE-ACETAMINOPHEN 5-500 MG PO TABS: ORAL_TABLET | ORAL | Status: DC

## 2011-12-17 MED ORDER — GABAPENTIN 100 MG PO CAPS: ORAL_CAPSULE | ORAL | Status: DC

## 2011-12-17 NOTE — Progress Notes (Signed)
  Subjective:    Patient ID: Martin [name], male    DOB: Feb 21, 1946, 66 y.o.   MRN: [id number]  HPI patient here with significant pain behind his left ear and also on the left side of his neck. He has a history of significant neck disease. He has had a cervical MRI this showed multilevel disc disease with impingement upon the cord and to the foramina. He did Dr. Gerlene Fee at that time and decided on conservative treatment he is overwhelmed at home came to his mother who does not allow him any time away over time for himself. He has become very depressed over this and this is possibly making his symptoms worse.    Review of Systems     Objective:   Physical Exam physical examination reveals a tearful male who is not in any distress. There is tenderness over the left side of the neck. There is decreased range of motion of the neck. T10 reflexes are symmetrical. Chest is clear cardiac exam is unremarkable.        Assessment & Plan:  Patient is here with significant cervical disease. We'll try Neurontin in low dose and taper up and see if we can get him some relief. We'll also make a referral to Dr. Lovell Sheehan to evaluate his neck disease I did refill his hydrocodone to keep the emergency use.

## 2011-12-17 NOTE — Telephone Encounter (Signed)
Pt was in today and saw dr daub. He was prescribed hydrocodone.

## 2011-12-24 ENCOUNTER — Ambulatory Visit (INDEPENDENT_AMBULATORY_CARE_PROVIDER_SITE_OTHER): Payer: Medicare Other | Admitting: Internal Medicine

## 2011-12-24 ENCOUNTER — Encounter: Payer: Self-pay | Admitting: Internal Medicine

## 2011-12-24 VITALS — BP 118/82 | HR 77 | Temp 97.9°F | Ht 68.0 in | Wt 204.4 lb

## 2011-12-24 DIAGNOSIS — R0989 Other specified symptoms and signs involving the circulatory and respiratory systems: Secondary | ICD-10-CM

## 2011-12-24 DIAGNOSIS — R0609 Other forms of dyspnea: Secondary | ICD-10-CM

## 2011-12-24 DIAGNOSIS — R0600 Dyspnea, unspecified: Secondary | ICD-10-CM | POA: Insufficient documentation

## 2011-12-24 DIAGNOSIS — E8881 Metabolic syndrome: Secondary | ICD-10-CM

## 2011-12-24 NOTE — Assessment & Plan Note (Signed)
Agree with Dr Jacinto Halim that cardiac unlikely. Will get PFts and reassess these at fu

## 2011-12-24 NOTE — Progress Notes (Deleted)
  Subjective:    Patient ID: Martin [name], male    DOB: [birth year], 66 y.o.   MRN: [id number]  HPI    Review of Systems     Objective:   Physical Exam  Nursing note and vitals reviewed. Constitutional: He is oriented to person, place, and time. He appears well-developed and well-nourished. No distress.       Body mass index is 31.08 kg/(m^2).   HENT:  Head: Normocephalic and atraumatic.  Right Ear: External ear normal.  Left Ear: External ear normal.  Mouth/Throat: Oropharynx is clear and moist. No oropharyngeal exudate.  Eyes: Conjunctivae normal and EOM are normal. Pupils are equal, round, and reactive to light. Right eye exhibits no discharge. Left eye exhibits no discharge. No scleral icterus.  Neck: Normal range of motion. Neck supple. No JVD present. No tracheal deviation present. No thyromegaly present.       mallampatti class 3  Cardiovascular: Normal rate, regular rhythm and intact distal pulses.  Exam reveals no gallop and no friction rub.   No murmur heard. Pulmonary/Chest: Effort normal and breath sounds normal. No respiratory distress. He has no wheezes. He has no rales. He exhibits no tenderness.  Abdominal: Soft. Bowel sounds are normal. He exhibits no distension and no mass. There is no tenderness. There is no rebound and no guarding.       Huge truncal obesity  Musculoskeletal: Normal range of motion. He exhibits no edema and no tenderness.       Severe scoliosis  Lymphadenopathy:    He has no cervical adenopathy.  Neurological: He is alert and oriented to person, place, and time. He has normal reflexes. No cranial nerve deficit. Coordination normal.  Skin: Skin is warm and dry. No rash noted. He is not diaphoretic. No erythema. No pallor.  Psychiatric: He has a normal mood and affect. His behavior is normal. Judgment and thought content normal.          Assessment & Plan:

## 2011-12-24 NOTE — Progress Notes (Signed)
Subjective:    Patient ID: Martin [name], male    DOB: 01/17/1946, 66 y.o.   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 Metabolic syndrome (premature fam hx of CAD, visceral obesity, hyperlipidemia, high bp). Somewhat of a poor historian but his HPI appears consisten with Dr Jacinto Halim notes  Says 1-2 months ago develoopd acute dyspnea. He thinks it was a panic atttack. IT was abrupt and happened while sleeping. He walked it off and wnet back to sleep and since then never has had another episode. Also noticed dyspnea 1 month ago when trying to pot some plants and since then has avoided the task. Past 1-2 months noticed decreased energy with walking exercies. HE saw dr Casimiro Needle for this and per hx stress test and echo normal. Hypertension Rx with coreg and per Dr Jacinto Halim dyspnea improved after that but not fully resolved. Dr Jacinto Halim wants to ensure pulmonary issues are sorted out befroe proceeding with more cardiac workup  Patient says he is better now but will not qualify his dyspnea any further.   He admits to fatigue and low mood duto caring for his mother with dementia  He is very interested in losing weight. HE thinks weigth is main reason for dsypnea . He thinks exercise is the way to lose weight. Diet is full of red mean and high glycemic foods. HE interested in knowing more about my nutritional paln  CXR 11/02/11  - Clinical Data: Shortness of breath  CHEST - 2 VIEW  Comparison: Portable chest x-ray of 06/08/2005  Findings: There is mild linear atelectasis or scarring at the  bases. No focal infiltrate or effusion is seen. A tiny nodular  opacity in the retrosternal air space may represent overlapping  bony structures but attention to this area on follow-up chest x-ray  is recommended. The heart is stable in size and there is a  thoracolumbar scoliosis present  with degenerative change.  IMPRESSION:  1. Mild basilar atelectasis or scarring. No active process. See  above discussion.  2. Stable thoracolumbar scoliosis with degenerative change   Past Medical History  Diagnosis Date  . Hyperlipidemia   . Hypertension   . BPH (benign prostatic hyperplasia)   . Glaucoma(365)     Bil.  . Cataract   . Impaired glucose tolerance 11/20/2010     Family History  Problem Relation Age of Onset  . Alcohol abuse Other   . Diabetes Other   . Hyperlipidemia Other   . Kidney disease Other   . Coronary artery disease Other   . Heart disease Mother      History   Social History  . Marital Status: Single    Spouse Name: N/A    Number of Children: N/A  . Years of Education: N/A   Occupational History  . retired    Social History Main Topics  . Smoking status: Never Smoker   . Smokeless tobacco: Never Used  . Alcohol Use: No  . Drug Use: No  . Sexually Active: Not Currently   Other Topics Concern  . Not on file   Social History Narrative   No regular exercise     Allergies  Allergen Reactions  . Enalapril Maleate     REACTION: rash     Outpatient Prescriptions Prior to Visit  Medication Sig Dispense Refill  . ALPRAZolam Prudy Feeler)  0.25 MG tablet Take 1 tablet (0.25 mg total) by mouth 3 (three) times daily as needed for anxiety.  60 tablet  1  . aspirin 81 MG EC tablet Take 81 mg by mouth daily.        . carvedilol (COREG) 6.25 MG tablet Take 6.25 mg by mouth 2 (two) times daily with a meal.      . fenofibrate (TRICOR) 145 MG tablet Take 1 tablet (145 mg total) by mouth daily.  90 tablet  3  . gabapentin (NEURONTIN) 100 MG capsule Take 2 capsules at night for the first 3 days then increase to one capsule in the morning and 2 at night for 3 days then increase to 2 capsules in the morning 2 capsules at night  90 capsule  3  . HYDROcodone-acetaminophen (VICODIN) 5-500 MG per tablet Take one half tablet to one tablet every 6 hours as needed  for severe neck pain  30 tablet  1  . losartan-hydrochlorothiazide (HYZAAR) 100-25 MG per tablet TAKE 1 TABLET BY MOUTH EVERY DAY  30 tablet  3  . Multiple Vitamin (MULTIVITAMIN) capsule Take 1 capsule by mouth daily.        [name] Kitchen omega-3 acid ethyl esters (LOVAZA) 1 G capsule Take 2 capsules (2 g total) by mouth 2 (two) times daily.  120 capsule  11  . cyclobenzaprine (FLEXERIL) 5 MG tablet       . ezetimibe-simvastatin (VYTORIN) 10-20 MG per tablet Take 1 tablet by mouth at bedtime.  112 tablet  0  . fluticasone (FLONASE) 50 MCG/ACT nasal spray 2 sprays in each nostril in the evening.  16 g  5  . naproxen (NAPRELAN) 500 MG 24 hr tablet Take 1 tablet (500 mg total) by mouth daily with breakfast.  30 tablet  11         Past Medical History  Diagnosis Date  . Hyperlipidemia   . Hypertension   . BPH (benign prostatic hyperplasia)   . Glaucoma(365)     Bil.  . Cataract   . Impaired glucose tolerance 11/20/2010     Family History  Problem Relation Age of Onset  . Alcohol abuse Other   . Diabetes Other   . Hyperlipidemia Other   . Kidney disease Other   . Coronary artery disease Other   . Heart disease Mother      History   Social History  . Marital Status: Single    Spouse Name: N/A    Number of Children: N/A  . Years of Education: N/A   Occupational History  . retired    Social History Main Topics  . Smoking status: Never Smoker   . Smokeless tobacco: Never Used  . Alcohol Use: No  . Drug Use: No  . Sexually Active: Not Currently   Other Topics Concern  . Not on file   Social History Narrative   No regular exercise     Allergies  Allergen Reactions  . Enalapril Maleate     REACTION: rash     Outpatient Prescriptions Prior to Visit  Medication Sig Dispense Refill  . ALPRAZolam (XANAX) 0.25 MG tablet Take 1 tablet (0.25 mg total) by mouth 3 (three) times daily as needed for anxiety.  60 tablet  1  . aspirin 81 MG EC tablet Take 81 mg by mouth daily.          . carvedilol (COREG) 6.25 MG tablet Take 6.25 mg by mouth 2 (two) times daily with a meal.      .  fenofibrate (TRICOR) 145 MG tablet Take 1 tablet (145 mg total) by mouth daily.  90 tablet  3  . gabapentin (NEURONTIN) 100 MG capsule Take 2 capsules at night for the first 3 days then increase to one capsule in the morning and 2 at night for 3 days then increase to 2 capsules in the morning 2 capsules at night  90 capsule  3  . HYDROcodone-acetaminophen (VICODIN) 5-500 MG per tablet Take one half tablet to one tablet every 6 hours as needed for severe neck pain  30 tablet  1  . losartan-hydrochlorothiazide (HYZAAR) 100-25 MG per tablet TAKE 1 TABLET BY MOUTH EVERY DAY  30 tablet  3  . Multiple Vitamin (MULTIVITAMIN) capsule Take 1 capsule by mouth daily.        [name] Kitchen omega-3 acid ethyl esters (LOVAZA) 1 G capsule Take 2 capsules (2 g total) by mouth 2 (two) times daily.  120 capsule  11  . cyclobenzaprine (FLEXERIL) 5 MG tablet       . ezetimibe-simvastatin (VYTORIN) 10-20 MG per tablet Take 1 tablet by mouth at bedtime.  112 tablet  0  . fluticasone (FLONASE) 50 MCG/ACT nasal spray 2 sprays in each nostril in the evening.  16 g  5  . naproxen (NAPRELAN) 500 MG 24 hr tablet Take 1 tablet (500 mg total) by mouth daily with breakfast.  30 tablet  11      Review of Systems  Constitutional: Negative for fever and unexpected weight change.  HENT: Negative for ear pain, nosebleeds, congestion, sore throat, rhinorrhea, sneezing, trouble swallowing, dental problem, postnasal drip and sinus pressure.   Eyes: Negative for redness and itching.  Respiratory: Positive for cough ( productive) and shortness of breath. Negative for chest tightness and wheezing.   Cardiovascular: Negative for palpitations and leg swelling.  Gastrointestinal: Negative for nausea and vomiting.  Genitourinary: Negative for dysuria.  Musculoskeletal: Negative for joint swelling.  Skin: Negative for rash.  Neurological: Positive for  headaches.  Hematological: Does not bruise/bleed easily.  Psychiatric/Behavioral: Positive for dysphoric mood. The patient is nervous/anxious.        Objective:   Physical Exam  Nursing note and vitals reviewed. Constitutional: He is oriented to person, place, and time. He appears well-developed and well-nourished. No distress.  HENT:  Head: Normocephalic and atraumatic.  Right Ear: External ear normal.  Left Ear: External ear normal.  Mouth/Throat: Oropharynx is clear and moist. No oropharyngeal exudate.  Eyes: Conjunctivae normal and EOM are normal. Pupils are equal, round, and reactive to light. Right eye exhibits no discharge. Left eye exhibits no discharge. No scleral icterus.  Neck: Normal range of motion. Neck supple. No JVD present. No tracheal deviation present. No thyromegaly present.  Cardiovascular: Normal rate, regular rhythm and intact distal pulses.  Exam reveals no gallop and no friction rub.   No murmur heard. Pulmonary/Chest: Effort normal and breath sounds normal. No respiratory distress. He has no wheezes. He has no rales. He exhibits no tenderness.  Abdominal: Soft. Bowel sounds are normal. He exhibits no distension and no mass. There is no tenderness. There is no rebound and no guarding.       Truncal obesity +  Musculoskeletal: Normal range of motion. He exhibits no edema and no tenderness.  Lymphadenopathy:    He has no cervical adenopathy.  Neurological: He is alert and oriented to person, place, and time. He has normal reflexes. No cranial nerve deficit. Coordination normal.       Significant scoliosis +  Skin: Skin is warm and dry. No rash noted. He is not diaphoretic. No erythema. No pallor.  Psychiatric: He has a normal mood and affect. His behavior is normal. Judgment and thought content normal.          Assessment & Plan:

## 2011-12-24 NOTE — Assessment & Plan Note (Signed)
I have asked him to keep a food diary for 3 days before next visit. I will go over my low glycemic diet plan with him at fu

## 2011-12-24 NOTE — Patient Instructions (Signed)
GEt full PFT and return for fu CReate a food diary for 3 days before next visit

## 2011-12-31 ENCOUNTER — Ambulatory Visit: Payer: Medicare Other | Admitting: Internal Medicine

## 2012-01-01 ENCOUNTER — Telehealth: Payer: Self-pay

## 2012-01-01 NOTE — Telephone Encounter (Signed)
Pt called, disturbed that an appointment had been made at [facility] and he was unaware that he needed to go there.  After explaining Dr. Ellis Parents recommendation and our procedures for obtaining a referral appointment, he decided that he would keep his appointment for 01/05/12 2pm with Dr. Lovell Sheehan.

## 2012-02-10 ENCOUNTER — Ambulatory Visit: Payer: Medicare Other | Admitting: Family Medicine

## 2012-02-18 ENCOUNTER — Encounter: Payer: Self-pay | Admitting: Family Medicine

## 2012-02-18 ENCOUNTER — Ambulatory Visit (INDEPENDENT_AMBULATORY_CARE_PROVIDER_SITE_OTHER): Payer: Medicare Other | Admitting: Family Medicine

## 2012-02-18 VITALS — BP 168/80 | HR 88 | Temp 98.9°F | Resp 16 | Ht 67.5 in | Wt 208.8 lb

## 2012-02-18 DIAGNOSIS — F3289 Other specified depressive episodes: Secondary | ICD-10-CM

## 2012-02-18 DIAGNOSIS — I10 Essential (primary) hypertension: Secondary | ICD-10-CM

## 2012-02-18 DIAGNOSIS — M5030 Other cervical disc degeneration, unspecified cervical region: Secondary | ICD-10-CM

## 2012-02-18 DIAGNOSIS — F411 Generalized anxiety disorder: Secondary | ICD-10-CM

## 2012-02-18 DIAGNOSIS — F329 Major depressive disorder, single episode, unspecified: Secondary | ICD-10-CM

## 2012-02-18 DIAGNOSIS — Z91199 Patient's noncompliance with other medical treatment and regimen due to unspecified reason: Secondary | ICD-10-CM

## 2012-02-18 DIAGNOSIS — Z9114 Patient's other noncompliance with medication regimen: Secondary | ICD-10-CM

## 2012-02-18 DIAGNOSIS — Z9119 Patient's noncompliance with other medical treatment and regimen: Secondary | ICD-10-CM

## 2012-02-18 DIAGNOSIS — F32A Depression, unspecified: Secondary | ICD-10-CM

## 2012-02-18 MED ORDER — HYDROCODONE-ACETAMINOPHEN 5-500 MG PO TABS: ORAL_TABLET | ORAL | Status: DC

## 2012-02-18 MED ORDER — ALPRAZOLAM 0.25 MG PO TABS: 0.2500 mg | ORAL_TABLET | Freq: Three times a day (TID) | ORAL | Status: DC | PRN

## 2012-02-18 NOTE — Progress Notes (Signed)
S: This 66 y.o. Cauc male is here with neck pain (known cervical spine DDD); he was seen by Dr. Cleta Alberts in early October and referral made to neurosurgeon. Pt cancelled the appointment and made an appt with an Orthopedist that he had seen  in the past. He is not taking Gabapentin because he said it caused GI upset and it came back up when he laid down to sleep at night. He continues to be very stressed with care of aging mother. Pt states he is struggling to take care of himself; he   is not taking one of his BP medication. Alprazolam with HC-APAP helps him relax at night so he can sleep.   ROS: Negative for appetite change or weight loss, CP or tightness, palpitations, SOB or DOE, cough, chronic GI or GU symptoms, weakness, numbness or syncope. He does have persistent neck pain, anxiety and sleep disturbance.  O:  Filed Vitals:   02/18/12 1421  BP: 168/80  Pulse: 88  Temp: 98.9 F (37.2 C)  Resp: 16   GEN: In mild distress (this is a chronic state). HENT: [place]/AT; EOMI w/ clear conj and sclerae. Otherwise benign. NECK:  Decreased ROM and muscle spasms. COR: RRR. LUNGS: Normal resp rate and effort. NEURO: A&O x 3; CNs intact. Motor and sensory functions w/o significant deficits. Gait is normal.  A/P: 1. DDD (degenerative disc disease), cervical  Pt to keep appt with specialist if we are able to resch him. RF; HC-APAP 5-500 mg tabs  #30  1 tab hs for pain.  2. Depression  ALPRAZolam (XANAX) 0.25 MG tablet  3. Anxiety state  Pt to discuss (with mother's physician) resources available in the community to help him continue to care for mother at home  4. HTN, goal below 140/80  Pt encouraged to take all prescribed medications as directed.  5. Noncompliance with medication regimen     Dallas Schimke called the Neurosurgeon's office and found that the pt had appt 01/05/12 but was marked as "No Show" though it is possible that he called and cancelled the appt. The practice will allow [place] to  reschedule. Pt will be notified when this has been arranged.

## 2012-02-18 NOTE — Patient Instructions (Addendum)
You need to be evaluated by a Neurosurgeon cor the disc disease in your neck. We will try to reschedule that appointment for you to be seen sometime in January or February 2014. I will see you again in 2 months; bring all your medications with you to that appointment.

## 2012-02-25 ENCOUNTER — Encounter: Payer: Self-pay | Admitting: Family Medicine

## 2012-03-22 ENCOUNTER — Telehealth: Payer: Self-pay

## 2012-03-22 ENCOUNTER — Other Ambulatory Visit: Payer: Self-pay | Admitting: Family Medicine

## 2012-03-22 NOTE — Telephone Encounter (Signed)
PT CALLED AGAIN TO SEE ABOUT HIS MRI.  HAS NOT HEARD ANYTHING ABOUT THIS BEING SCHEDULED YET?  HE IS UP AND DOWN ALL NIGHT WITH HIS NECK.  CAN'T GET ANY REST.  PLEASE CALL ASAP!  HIS LAST VISIT WAS December 5TH.    [phone]

## 2012-03-22 NOTE — Telephone Encounter (Signed)
PT STATES HE STILL HAVEN'T BEEN SET UP WITH [facility] BUT WOULD LIKE TO HAVE SOME OXYCODONE CALLED IN UNTIL HE CAN GET HIS APPT PLEASE CALL [phone]

## 2012-03-23 ENCOUNTER — Other Ambulatory Visit: Payer: Self-pay | Admitting: Family Medicine

## 2012-03-23 NOTE — Telephone Encounter (Signed)
Medication refill phoned in to [facility]: HC-APAP 5-500 mg #30 w/ 1 refill.

## 2012-03-25 NOTE — Telephone Encounter (Signed)
Please call the patient.  Who wants the MRI - we did not order it - ? Neurosurgery will not see him until it is done. Pt was given vicodin on 1/7 I cannot write for Percocet.

## 2012-03-29 NOTE — Telephone Encounter (Signed)
Patient was seen by Dr Lovell Sheehan, he ordered the scan. Patient has called the office there and was advised this is being set up for him. [phone] is the number there, he will call there and find out what is going on with the scan.

## 2012-03-30 ENCOUNTER — Telehealth: Payer: Self-pay

## 2012-03-30 NOTE — Telephone Encounter (Signed)
Spoke with patient, he is still waiting on MRI to be setup by Dr. Lovell Sheehan.  Advised patient that he should call Dr. Lovell Sheehan office or insurance company.  Patient states understanding.

## 2012-03-31 ENCOUNTER — Other Ambulatory Visit: Payer: Self-pay | Admitting: Neurosurgery

## 2012-03-31 ENCOUNTER — Telehealth: Payer: Self-pay | Admitting: Radiology

## 2012-03-31 DIAGNOSIS — M542 Cervicalgia: Secondary | ICD-10-CM

## 2012-03-31 NOTE — Telephone Encounter (Signed)
Patients MRI was ordered by Dr Lovell Sheehan Neurosurgeon. I think he is having the scan through their facility. He was previously advised to contact Dr Lovell Sheehan about this scan. He states he has not gotten a call from their office. I called [facility], to find out what his status is for the scan.

## 2012-03-31 NOTE — Telephone Encounter (Signed)
Message copied by Caffie Damme on Thu Mar 31, 2012  9:35 AM ------      Message from: Dow Adolph B      Created: Thu Mar 31, 2012  8:40 AM       That's great! Yes, schedule the MRI for him at his earliest convenience.            Thank you.      ----- Message -----         From: Ericka Pontiff         Sent: 03/30/2012   3:12 PM           To: Maurice March, MD            Dr. Audria Nine,            Mr. Pullin called he's insurance company got authorization for he's MRI.  Can we set this up for him?            Please let the Amy Littrell know.  I will not be in the office for the next couple of days.            Thank you,                  ReNee' Artist

## 2012-04-06 ENCOUNTER — Ambulatory Visit
Admission: RE | Admit: 2012-04-06 | Discharge: 2012-04-06 | Disposition: A | Discharge status: Home / Self Care | Payer: Medicare Other | Source: Ambulatory Visit | Attending: Neurosurgery | Admitting: Neurosurgery

## 2012-04-06 DIAGNOSIS — M542 Cervicalgia: Secondary | ICD-10-CM

## 2012-04-07 ENCOUNTER — Telehealth: Payer: Self-pay

## 2012-04-07 ENCOUNTER — Other Ambulatory Visit: Payer: Self-pay | Admitting: Family Medicine

## 2012-04-07 MED ORDER — LOSARTAN POTASSIUM-HCTZ 100-25 MG PO TABS: 1.0000 | ORAL_TABLET | Freq: Every day | ORAL | Status: DC

## 2012-04-07 NOTE — Telephone Encounter (Signed)
Patient has been non compliant according to your chart note. He is requesting Hyzaar but according to record, he has last had this filled on 11/27/11 by another practice, do you want to fill this?

## 2012-04-07 NOTE — Telephone Encounter (Signed)
losartan-hydrochlorothiazide (HYZAAR) 100-25 MG per tablet   Patient is asking if Dr. Audria Nine to fill this:  [facility] [address]   [phone] (H)

## 2012-04-08 ENCOUNTER — Other Ambulatory Visit: Payer: Medicare Other

## 2012-04-21 ENCOUNTER — Ambulatory Visit (INDEPENDENT_AMBULATORY_CARE_PROVIDER_SITE_OTHER): Payer: Medicare Other | Admitting: Family Medicine

## 2012-04-21 ENCOUNTER — Other Ambulatory Visit: Payer: Self-pay | Admitting: Family Medicine

## 2012-04-21 ENCOUNTER — Encounter: Payer: Self-pay | Admitting: Family Medicine

## 2012-04-21 VITALS — BP 132/76 | HR 81 | Temp 98.1°F | Resp 16 | Ht 68.0 in | Wt 206.0 lb

## 2012-04-21 DIAGNOSIS — G8929 Other chronic pain: Secondary | ICD-10-CM

## 2012-04-21 DIAGNOSIS — Z79899 Other long term (current) drug therapy: Secondary | ICD-10-CM

## 2012-04-21 DIAGNOSIS — F419 Anxiety disorder, unspecified: Secondary | ICD-10-CM

## 2012-04-21 DIAGNOSIS — Z6379 Other stressful life events affecting family and household: Secondary | ICD-10-CM

## 2012-04-21 DIAGNOSIS — F411 Generalized anxiety disorder: Secondary | ICD-10-CM

## 2012-04-21 DIAGNOSIS — M542 Cervicalgia: Secondary | ICD-10-CM

## 2012-04-21 DIAGNOSIS — Z636 Dependent relative needing care at home: Secondary | ICD-10-CM

## 2012-04-21 MED ORDER — ALPRAZOLAM 0.5 MG PO TBDP: 0.5000 mg | ORAL_TABLET | Freq: Two times a day (BID) | ORAL | Status: DC | PRN

## 2012-04-21 MED ORDER — CYCLOBENZAPRINE HCL 5 MG PO TABS: ORAL_TABLET | ORAL | Status: DC

## 2012-04-21 NOTE — Patient Instructions (Signed)
Try to get out an walk for 10 minutes daily. This will help you feel better and enable you to get some exercise.

## 2012-04-24 DIAGNOSIS — Z636 Dependent relative needing care at home: Secondary | ICD-10-CM | POA: Insufficient documentation

## 2012-04-24 MED ORDER — HYDROCODONE-ACETAMINOPHEN 5-325 MG PO TABS: ORAL_TABLET | ORAL | Status: DC

## 2012-04-24 NOTE — Progress Notes (Signed)
S; This 67 y.o. Cauc male is here for follow-up and medication review/ refills. He has chronic neck pain due to DDD and saw Dr. Lovell Sheehan 1 week ago. He is awaiting referral for PT. Pt has ongoing nocturnal pain which has worsened in last 4 months. His sleep disturbance is aggravated by the ongoing caretaker issues with his mother. He has some support and is anxious and discouraged about the situation. Two male relatives will be visiting soon and they plan to take his mother shopping because  She needs some better fitting clothes. Pt can not convince her to go shopping with him; he feels a lot of guilt about his perceived inadequacies as a caregiver.  We had a lengthy discussion about this situation; pt reports he feels better just with the opportunity to vent and get some advise and a different perspective  In reviewing medications to check for refills, pt reports that Cardiology is responsible for Carvedilol and fenofibrate. He denies medication side effects.  ROS: As per HPI.  O:  Filed Vitals:   04/21/12 1425  BP: 132/76  Pulse: 81  Temp: 98.1 F (36.7 C)  Resp: 16   GEN: In NAD; WN,WD. HENT: [place]/AT; EOMI w/ clear conj/sclerae. Wears glasses. EACs/ oroph normal. NECK: Decreased ROM w/ stiffness noted w/ rotation. COR: RRR> LUNGS: Normal resp rate and effort. MS: MAEs; decreased ROM at shoulders. NEURO: A&O x 3; CNs intact. Mentation- anxious demeanor but pleasant; thoughts are tangential at times. Pt is not labile, agitated or inappropriate. He expresses no SI/HI. Judgement is good.  A/P:  1. Chronic anxiety    Continue current medication; maintain good support system as cares of mother remains a top priority for this pt.  2. Chronic neck pain    Await PT referral; expect phone call w/ information fron Dr. Lovell Sheehan' office within next 7-10 days.  3. Medication management     4. Caregiver stress    Meds ordered this encounter  Medications  . cyclobenzaprine (FLEXERIL) 5 MG tablet    Sig: Take 1 tablet hs to relax muscle spasms in neck and shoulders.    Dispense:  30 tablet    Refill:  1  .               . ALPRAZolam (NIRAVAM) 0.5 MG dissolvable tablet    Sig: Take 1 tablet (0.5 mg total) by mouth 2 (two) times daily as needed for anxiety.    Dispense:  50 tablet    Refill:  1    Please dispense regular tab (not dissolvable tab); I will reorder the appropriate formulation when next medication refill needs to be updated.

## 2012-04-25 MED ORDER — ALPRAZOLAM 0.5 MG PO TBDP: 0.5000 mg | ORAL_TABLET | Freq: Two times a day (BID) | ORAL | Status: DC | PRN

## 2012-04-26 ENCOUNTER — Other Ambulatory Visit: Payer: Self-pay | Admitting: Family Medicine

## 2012-04-26 ENCOUNTER — Telehealth: Payer: Self-pay | Admitting: *Deleted

## 2012-04-26 MED ORDER — METAXALONE 800 MG PO TABS: ORAL_TABLET | ORAL | Status: DC

## 2012-04-26 NOTE — Telephone Encounter (Signed)
I prescribed generic Skelaxin 800 mg tablets but I reduced the dose; pt should take 1/2 tablet every 12 hours as needed for pain or muscle spasm.

## 2012-04-26 NOTE — Telephone Encounter (Signed)
[facility] sending [facility] stating that pt medicare will not cover Flexeril.  Preferred drugs are Zanaflex and baclofen.

## 2012-04-27 ENCOUNTER — Other Ambulatory Visit: Payer: Self-pay | Admitting: Family Medicine

## 2012-04-27 MED ORDER — BACLOFEN 10 MG PO TABS: ORAL_TABLET | ORAL | Status: DC

## 2012-04-27 NOTE — Telephone Encounter (Signed)
Medicare does not cover Skelaxin, and generic Skelaxin is over $100, Insurance will only cover Zanaflex and Baclofen. Do you want to change?

## 2012-05-03 NOTE — Telephone Encounter (Signed)
Medication prescribed: Baclofen (generic) - see medication list.

## 2012-06-29 ENCOUNTER — Ambulatory Visit (INDEPENDENT_AMBULATORY_CARE_PROVIDER_SITE_OTHER): Payer: Medicare Other | Admitting: Emergency Medicine

## 2012-06-29 VITALS — BP 143/74 | HR 88 | Temp 98.2°F | Resp 18 | Ht 67.75 in | Wt 205.0 lb

## 2012-06-29 DIAGNOSIS — M5432 Sciatica, left side: Secondary | ICD-10-CM

## 2012-06-29 DIAGNOSIS — M5430 Sciatica, unspecified side: Secondary | ICD-10-CM

## 2012-06-29 MED ORDER — TRAMADOL HCL 50 MG PO TABS: 50.0000 mg | ORAL_TABLET | Freq: Three times a day (TID) | ORAL | Status: DC | PRN

## 2012-06-29 MED ORDER — NAPROXEN SODIUM 550 MG PO TABS: 550.0000 mg | ORAL_TABLET | Freq: Two times a day (BID) | ORAL | Status: DC

## 2012-06-29 MED ORDER — CYCLOBENZAPRINE HCL 10 MG PO TABS: 10.0000 mg | ORAL_TABLET | Freq: Three times a day (TID) | ORAL | Status: DC | PRN

## 2012-06-29 NOTE — Patient Instructions (Addendum)
Sciatica Sciatica is pain, weakness, numbness, or tingling along the path of the sciatic nerve. The nerve starts in the lower back and runs down the back of each leg. The nerve controls the muscles in the lower leg and in the back of the knee, while also providing sensation to the back of the thigh, lower leg, and the sole of your foot. Sciatica is a symptom of another medical condition. For instance, nerve damage or certain conditions, such as a herniated disk or bone spur on the spine, pinch or put pressure on the sciatic nerve. This causes the pain, weakness, or other sensations normally associated with sciatica. Generally, sciatica only affects one side of the body. CAUSES   Herniated or slipped disc.  Degenerative disk disease.  A pain disorder involving the narrow muscle in the buttocks (piriformis syndrome).  Pelvic injury or fracture.  Pregnancy.  Tumor (rare). SYMPTOMS  Symptoms can vary from mild to very severe. The symptoms usually travel from the low back to the buttocks and down the back of the leg. Symptoms can include:  Mild tingling or dull aches in the lower back, leg, or hip.  Numbness in the back of the calf or sole of the foot.  Burning sensations in the lower back, leg, or hip.  Sharp pains in the lower back, leg, or hip.  Leg weakness.  Severe back pain inhibiting movement. These symptoms may get worse with coughing, sneezing, laughing, or prolonged sitting or standing. Also, being overweight may worsen symptoms. DIAGNOSIS  Your caregiver will perform a physical exam to look for common symptoms of sciatica. He or she may ask you to do certain movements or activities that would trigger sciatic nerve pain. Other tests may be performed to find the cause of the sciatica. These may include:  Blood tests.  X-rays.  Imaging tests, such as an MRI or CT scan. TREATMENT  Treatment is directed at the cause of the sciatic pain. Sometimes, treatment is not necessary  and the pain and discomfort goes away on its own. If treatment is needed, your caregiver may suggest:  Over-the-counter medicines to relieve pain.  Prescription medicines, such as anti-inflammatory medicine, muscle relaxants, or narcotics.  Applying heat or ice to the painful area.  Steroid injections to lessen pain, irritation, and inflammation around the nerve.  Reducing activity during periods of pain.  Exercising and stretching to strengthen your abdomen and improve flexibility of your spine. Your caregiver may suggest losing weight if the extra weight makes the back pain worse.  Physical therapy.  Surgery to eliminate what is pressing or pinching the nerve, such as a bone spur or part of a herniated disk. HOME CARE INSTRUCTIONS   Only take over-the-counter or prescription medicines for pain or discomfort as directed by your caregiver.  Apply ice to the affected area for 20 minutes, 3 4 times a day for the first 48 72 hours. Then try heat in the same way.  Exercise, stretch, or perform your usual activities if these do not aggravate your pain.  Attend physical therapy sessions as directed by your caregiver.  Keep all follow-up appointments as directed by your caregiver.  Do not wear high heels or shoes that do not provide proper support.  Check your mattress to see if it is too soft. A firm mattress may lessen your pain and discomfort. SEEK IMMEDIATE MEDICAL CARE IF:   You lose control of your bowel or bladder (incontinence).  You have increasing weakness in the lower back,   pelvis, buttocks, or legs.  You have redness or swelling of your back.  You have a burning sensation when you urinate.  You have pain that gets worse when you lie down or awakens you at night.  Your pain is worse than you have experienced in the past.  Your pain is lasting longer than 4 weeks.  You are suddenly losing weight without reason. MAKE SURE YOU:  Understand these  instructions.  Will watch your condition.  Will get help right away if you are not doing well or get worse. Document Released: 02/24/2001 Document Revised: 09/01/2011 Document Reviewed: 07/12/2011 [employer] Patient Information 2013 [employer].  

## 2012-06-29 NOTE — Progress Notes (Signed)
[facility] [address], [place] [address] [phone]  Date:  06/29/2012   Name:  Martin [name]   DOB:  06/30/1945   MRN:  [id number]  PCP:  Sanda Linger, MD    Chief Complaint: Hip Pain   History of Present Illness:  Martin [name] is a 67 y.o. very pleasant male patient who presents with the following:  Week long history of pain in left hip posterolaterally.  Radiates into the left mid thigh.  No history of injury or overuse.  Denies weakness or numbness or tingling.  Pain worse with walking and sneezing or coughing. Unable to lay on his left side at night.  No improvement with over the counter medications or other home remedies. Denies other complaint or health concern today.   Patient Active Problem List  Diagnosis  . HYPERLIPIDEMIA  . HYPERTENSION, BENIGN ESSENTIAL  . Allergic rhinitis due to other allergen  . HYPERTROPHY PROSTATE W/UR OBST & OTH LUTS  . TRANSAMINASES, SERUM, ELEVATED  . Impaired glucose tolerance  . Preventative health care  . Anxiety  . Depression  . DDD (degenerative disc disease), cervical  . Myofascial pain dysfunction syndrome  . Metabolic syndrome  . Dyspnea  . Caregiver stress    Past Medical History  Diagnosis Date  . Hyperlipidemia   . Hypertension   . BPH (benign prostatic hyperplasia)   . Glaucoma(365)     Bil.  . Cataract   . Impaired glucose tolerance 11/20/2010    Past Surgical History  Procedure Laterality Date  . Shoulder arthroscopy   right shoulder    with bone spurs  . Tonsilectomy, adenoidectomy, bilateral myringotomy and tubes  1959  . Back surgery      29+years ago    History  Substance Use Topics  . Smoking status: Never Smoker   . Smokeless tobacco: Never Used  . Alcohol Use: No    Family History  Problem Relation Age of Onset  . Alcohol abuse Other   . Diabetes Other   . Hyperlipidemia Other   . Kidney disease Other   . Coronary artery disease Other   . Heart disease  Mother     Allergies  Allergen Reactions  . Enalapril Maleate     REACTION: rash    Medication list has been reviewed and updated.  Current Outpatient Prescriptions on File Prior to Visit  Medication Sig Dispense Refill  . ALPRAZolam (NIRAVAM) 0.5 MG dissolvable tablet Take 1 tablet (0.5 mg total) by mouth 2 (two) times daily as needed for anxiety.  50 tablet  1  . aspirin 81 MG EC tablet Take 81 mg by mouth daily.        . carvedilol (COREG) 6.25 MG tablet Take 6.25 mg by mouth 2 (two) times daily with a meal.      . fenofibrate (TRICOR) 145 MG tablet Take 1 tablet (145 mg total) by mouth daily.  90 tablet  3  . losartan-hydrochlorothiazide (HYZAAR) 100-25 MG per tablet Take 1 tablet by mouth daily.  30 tablet  3  . Multiple Vitamin (MULTIVITAMIN) capsule Take 1 capsule by mouth daily.        . baclofen (LIORESAL) 10 MG tablet Take 1/2 tablet twice a day prn neck pain or muscle spasm.  30 tablet  1  . ezetimibe-simvastatin (VYTORIN) 10-20 MG per tablet Take 1 tablet by mouth at bedtime.  112 tablet  0  . fluticasone (FLONASE) 50 MCG/ACT nasal spray 2  sprays in each nostril in the evening.  16 g  5  . HYDROcodone-acetaminophen (NORCO/VICODIN) 5-325 MG per tablet Take 1/2 - 1 tablet every 6-8 hours prn severe pain.  30 tablet  0  . omega-3 acid ethyl esters (LOVAZA) 1 G capsule Take 2 capsules (2 g total) by mouth 2 (two) times daily.  120 capsule  11   No current facility-administered medications on file prior to visit.    Review of Systems:  As per HPI, otherwise negative.    Physical Examination: Filed Vitals:   06/29/12 1828  BP: 143/74  Pulse: 88  Temp: 98.2 F (36.8 C)  Resp: 18   Filed Vitals:   06/29/12 1828  Height: 5' 7.75" (1.721 m)  Weight: 205 lb (92.987 kg)   Body mass index is 31.4 kg/(m^2). Ideal Body Weight: Weight in (lb) to have BMI = 25: 162.9   GEN: WDWN, NAD, Non-toxic, Alert & Oriented x 3 HEENT: Atraumatic, Normocephalic.  Ears and Nose: No  external deformity. EXTR: No clubbing/cyanosis/edema NEURO: Normal gait.  PSYCH: Normally interactive. Conversant. Not depressed or anxious appearing.  Calm demeanor.  SLR positive.  Normal neuro exam lower leg.  Tender over sciatic notch.   Assessment and Plan: Sciatic neuritis Anaprox Tramadol Flexeril Local heat   Follow up in one week   Signed,  Phillips Odor, MD

## 2012-07-06 ENCOUNTER — Emergency Department (HOSPITAL_COMMUNITY): Payer: Medicare Other

## 2012-07-06 ENCOUNTER — Emergency Department (HOSPITAL_COMMUNITY)
Admission: EM | Admit: 2012-07-06 | Discharge: 2012-07-06 | Disposition: A | Discharge status: Home / Self Care | Payer: Medicare Other | Attending: Emergency Medicine | Admitting: Emergency Medicine

## 2012-07-06 ENCOUNTER — Telehealth: Payer: Self-pay

## 2012-07-06 DIAGNOSIS — IMO0002 Reserved for concepts with insufficient information to code with codable children: Secondary | ICD-10-CM | POA: Insufficient documentation

## 2012-07-06 DIAGNOSIS — M7062 Trochanteric bursitis, left hip: Secondary | ICD-10-CM

## 2012-07-06 DIAGNOSIS — R300 Dysuria: Secondary | ICD-10-CM | POA: Insufficient documentation

## 2012-07-06 DIAGNOSIS — Z8669 Personal history of other diseases of the nervous system and sense organs: Secondary | ICD-10-CM | POA: Insufficient documentation

## 2012-07-06 DIAGNOSIS — Z7982 Long term (current) use of aspirin: Secondary | ICD-10-CM | POA: Insufficient documentation

## 2012-07-06 DIAGNOSIS — M76899 Other specified enthesopathies of unspecified lower limb, excluding foot: Secondary | ICD-10-CM | POA: Insufficient documentation

## 2012-07-06 DIAGNOSIS — I10 Essential (primary) hypertension: Secondary | ICD-10-CM | POA: Insufficient documentation

## 2012-07-06 DIAGNOSIS — Z79899 Other long term (current) drug therapy: Secondary | ICD-10-CM | POA: Insufficient documentation

## 2012-07-06 DIAGNOSIS — Z87448 Personal history of other diseases of urinary system: Secondary | ICD-10-CM | POA: Insufficient documentation

## 2012-07-06 DIAGNOSIS — E785 Hyperlipidemia, unspecified: Secondary | ICD-10-CM | POA: Insufficient documentation

## 2012-07-06 MED ORDER — KETOROLAC TROMETHAMINE 60 MG/2ML IM SOLN
60.0000 mg | Freq: Once | INTRAMUSCULAR | Status: AC
  Administered 2012-07-06: 60 mg via INTRAMUSCULAR
  Filled 2012-07-06: qty 2

## 2012-07-06 MED ORDER — IBUPROFEN 800 MG PO TABS: 800.0000 mg | ORAL_TABLET | Freq: Three times a day (TID) | ORAL | Status: DC | PRN

## 2012-07-06 MED ORDER — HYDROCODONE-ACETAMINOPHEN 5-325 MG PO TABS: 1.0000 | ORAL_TABLET | Freq: Four times a day (QID) | ORAL | Status: DC | PRN

## 2012-07-06 MED ORDER — HYDROCODONE-ACETAMINOPHEN 5-325 MG PO TABS
2.0000 | ORAL_TABLET | Freq: Once | ORAL | Status: AC
  Administered 2012-07-06: 2 via ORAL
  Filled 2012-07-06: qty 2

## 2012-07-06 NOTE — ED Provider Notes (Signed)
History     CSN: [id number]  Arrival date & time 07/06/12  1209   None     Chief Complaint  Patient presents with  . Leg Pain    (Consider location/radiation/quality/duration/timing/severity/associated sxs/prior treatment) HPI 67 yo M with HTN, BPH presenting with left hip and leg pain.  He states the pain has been intermittent for several weeks.  Worse over the last week or so.  The pain is over the lateral hip and into his buttock, travels down the outside of his leg to the knee.  No numbness, tingling, weakness.  Any movement of the hip is painful.  He was seen at Urgent Care last week and diagnosed with sciatica.  He has been taking naprosyn and flexeril.  He states the flexeril helps, and allows him to lay on the that side.  No fevers or chills.  No nausea or vomiting.   Past Medical History  Diagnosis Date  . Hyperlipidemia   . Hypertension   . BPH (benign prostatic hyperplasia)   . Glaucoma(365)     Bil.  . Cataract   . Impaired glucose tolerance 11/20/2010    Past Surgical History  Procedure Laterality Date  . Shoulder arthroscopy   right shoulder    with bone spurs  . Tonsilectomy, adenoidectomy, bilateral myringotomy and tubes  1959  . Back surgery      29+years ago    Family History  Problem Relation Age of Onset  . Alcohol abuse Other   . Diabetes Other   . Hyperlipidemia Other   . Kidney disease Other   . Coronary artery disease Other   . Heart disease Mother     History  Substance Use Topics  . Smoking status: Never Smoker   . Smokeless tobacco: Never Used  . Alcohol Use: No      Review of Systems  Constitutional: Negative for fever and chills.  HENT: Negative.   Eyes: Negative.   Respiratory: Negative.   Cardiovascular: Negative.   Gastrointestinal: Negative.   Genitourinary: Positive for difficulty urinating. Negative for flank pain.  Musculoskeletal:       Left hip and leg pain  Neurological: Negative.     Allergies  Enalapril  maleate  Home Medications   Current Outpatient Rx  Name  Route  Sig  Dispense  Refill  . ALPRAZolam (NIRAVAM) 0.5 MG dissolvable tablet   Oral   Take 1 tablet (0.5 mg total) by mouth 2 (two) times daily as needed for anxiety.   50 tablet   1     Please dispense regular tab (not dissolvable tab); ...   . aspirin 81 MG EC tablet   Oral   Take 81 mg by mouth daily.           . baclofen (LIORESAL) 10 MG tablet      Take 1/2 tablet twice a day prn neck pain or muscle spasm.   30 tablet   1   . carvedilol (COREG) 6.25 MG tablet   Oral   Take 6.25 mg by mouth 2 (two) times daily with a meal.         . cyclobenzaprine (FLEXERIL) 10 MG tablet   Oral   Take 1 tablet (10 mg total) by mouth 3 (three) times daily as needed for muscle spasms.   30 tablet   0   . fenofibrate (TRICOR) 145 MG tablet   Oral   Take 1 tablet (145 mg total) by mouth daily.   90 tablet  3   . fluticasone (FLONASE) 50 MCG/ACT nasal spray      2 sprays in each nostril in the evening.   16 g   5   . HYDROcodone-acetaminophen (NORCO/VICODIN) 5-325 MG per tablet      Take 1/2 - 1 tablet every 6-8 hours prn severe pain.   30 tablet   0   . loratadine (CLARITIN) 10 MG tablet   Oral   Take 10 mg by mouth daily.         [name] Kitchen losartan-hydrochlorothiazide (HYZAAR) 100-25 MG per tablet   Oral   Take 1 tablet by mouth daily.   30 tablet   3   . Multiple Vitamin (MULTIVITAMIN) capsule   Oral   Take 1 capsule by mouth daily.           . naproxen sodium (ANAPROX DS) 550 MG tablet   Oral   Take 1 tablet (550 mg total) by mouth 2 (two) times daily with a meal.   40 tablet   0   . traMADol (ULTRAM) 50 MG tablet   Oral   Take 1 tablet (50 mg total) by mouth every 8 (eight) hours as needed for pain.   30 tablet   0   . EXPIRED: ezetimibe-simvastatin (VYTORIN) 10-20 MG per tablet   Oral   Take 1 tablet by mouth at bedtime.   112 tablet   0   . EXPIRED: omega-3 acid ethyl esters (LOVAZA)  1 G capsule   Oral   Take 2 capsules (2 g total) by mouth 2 (two) times daily.   120 capsule   11     BP 116/67  Pulse 95  Temp(Src) 99.6 F (37.6 C) (Oral)  Resp 16  SpO2 99%  Physical Exam  Constitutional: He appears well-developed and well-nourished. He appears distressed (mild).  HENT:  Head: Normocephalic and atraumatic.  Musculoskeletal:       Left hip: He exhibits decreased range of motion (limited by pain) and tenderness (over greater trochanter). He exhibits normal strength, no bony tenderness, no swelling, no deformity and no laceration.       Legs: SLR positive on L with reproduction of pain in lateral thigh  Skin: He is not diaphoretic.    ED Course  Procedures (including critical care time)  Labs Reviewed - No data to display Dg Hip Complete Left  07/06/2012  *RADIOLOGY REPORT*  Clinical Data: Left hip pain without injury.  LEFT HIP - COMPLETE 2+ VIEW  Comparison: None.  Findings: Both hips are located.  No fracture is identified.  Joint spaces are preserved.  Surrounding osseous and soft tissue structures are unremarkable.  IMPRESSION: Negative exam.   Original Report Authenticated By: Holley Dexter, M.D.      No diagnosis found.    MDM  67 yo M with HTN, HLD presenting with left lateral hip and thigh pain.  Patient is most tender over greater trochanter.  Pain improved with IM toradol.  X-ray of the hip is negative.  Discussed likely trochanteric bursitis with patient.  Will discharge with ibuprofen, norco, exercises and follow up with PCP for possible injection.  Reviewed reasons to return. Patient is agreeable with this plan.         Phebe Colla, MD 07/06/12 1416

## 2012-07-06 NOTE — ED Notes (Signed)
Pt states that his left leg has been hurting for 4 months.  States it has been the same since then.  Was seen at [facility].  Was told it may be sciatica.  Pt is moaning and groaning but then will start laughing.  Pt's mother is crying and very anxious.

## 2012-07-06 NOTE — Telephone Encounter (Signed)
He should return to clinic, Dr Dareen Piano wanted to check him in 1 week.

## 2012-07-06 NOTE — ED Notes (Signed)
Per EMS: Pt c/o lt leg pain x 4 months.  Pain starts in hip and goes down lt thigh.  Pt's mother was very upset, crying, etc.  She accompanied pt on ambulance.  Pain 10/10.  Pt has not been dx w/ any leg problems.

## 2012-07-06 NOTE — ED Provider Notes (Signed)
I saw and evaluated the patient, reviewed the resident's note and I agree with the findings and plan.   Lyanne Co, MD 07/06/12 [phone]

## 2012-07-06 NOTE — Telephone Encounter (Signed)
Pt came in other day and was seen by Dr. Dareen Piano and what was given to him is not helping him. He is wanting to know what he could do to for the pain to ease.

## 2012-07-06 NOTE — ED Notes (Signed)
JYN:[id number]<AO> Expected date:<BR> Expected time:<BR> Means of arrival:<BR> Comments:<BR> 67yo-m chronic leg pain x4 months

## 2012-07-06 NOTE — Telephone Encounter (Signed)
Is there another treatment option for this patient and sciatic

## 2012-07-06 NOTE — Telephone Encounter (Signed)
Patient coming in right now. He reports he is having difficulty walking and he is unable to get to sleep at night.

## 2012-07-07 ENCOUNTER — Encounter (HOSPITAL_COMMUNITY): Payer: Self-pay | Admitting: Vascular Surgery

## 2012-07-07 ENCOUNTER — Observation Stay (HOSPITAL_COMMUNITY): Payer: Medicare Other

## 2012-07-07 ENCOUNTER — Emergency Department (HOSPITAL_COMMUNITY): Payer: Medicare Other

## 2012-07-07 ENCOUNTER — Ambulatory Visit (INDEPENDENT_AMBULATORY_CARE_PROVIDER_SITE_OTHER): Payer: Medicare Other | Admitting: Internal Medicine

## 2012-07-07 ENCOUNTER — Observation Stay (HOSPITAL_COMMUNITY)
Admission: EM | Admit: 2012-07-07 | Discharge: 2012-07-08 | Disposition: A | Discharge status: Home / Self Care | Payer: Medicare Other | Attending: Family Medicine | Admitting: Family Medicine

## 2012-07-07 VITALS — BP 110/54 | HR 80 | Resp 18

## 2012-07-07 DIAGNOSIS — I959 Hypotension, unspecified: Secondary | ICD-10-CM

## 2012-07-07 DIAGNOSIS — N401 Enlarged prostate with lower urinary tract symptoms: Secondary | ICD-10-CM | POA: Diagnosis present

## 2012-07-07 DIAGNOSIS — J309 Allergic rhinitis, unspecified: Secondary | ICD-10-CM | POA: Insufficient documentation

## 2012-07-07 DIAGNOSIS — I10 Essential (primary) hypertension: Secondary | ICD-10-CM | POA: Insufficient documentation

## 2012-07-07 DIAGNOSIS — T887XXA Unspecified adverse effect of drug or medicament, initial encounter: Secondary | ICD-10-CM

## 2012-07-07 DIAGNOSIS — E860 Dehydration: Secondary | ICD-10-CM | POA: Diagnosis present

## 2012-07-07 DIAGNOSIS — R55 Syncope and collapse: Secondary | ICD-10-CM | POA: Diagnosis present

## 2012-07-07 DIAGNOSIS — N400 Enlarged prostate without lower urinary tract symptoms: Secondary | ICD-10-CM | POA: Insufficient documentation

## 2012-07-07 DIAGNOSIS — R739 Hyperglycemia, unspecified: Secondary | ICD-10-CM

## 2012-07-07 DIAGNOSIS — M25559 Pain in unspecified hip: Secondary | ICD-10-CM | POA: Insufficient documentation

## 2012-07-07 DIAGNOSIS — T50905A Adverse effect of unspecified drugs, medicaments and biological substances, initial encounter: Secondary | ICD-10-CM

## 2012-07-07 DIAGNOSIS — G8929 Other chronic pain: Secondary | ICD-10-CM | POA: Insufficient documentation

## 2012-07-07 DIAGNOSIS — R42 Dizziness and giddiness: Secondary | ICD-10-CM

## 2012-07-07 DIAGNOSIS — E785 Hyperlipidemia, unspecified: Secondary | ICD-10-CM | POA: Insufficient documentation

## 2012-07-07 DIAGNOSIS — R7309 Other abnormal glucose: Secondary | ICD-10-CM

## 2012-07-07 DIAGNOSIS — D72829 Elevated white blood cell count, unspecified: Secondary | ICD-10-CM

## 2012-07-07 DIAGNOSIS — R0981 Nasal congestion: Secondary | ICD-10-CM

## 2012-07-07 DIAGNOSIS — M549 Dorsalgia, unspecified: Secondary | ICD-10-CM | POA: Insufficient documentation

## 2012-07-07 DIAGNOSIS — R6889 Other general symptoms and signs: Secondary | ICD-10-CM

## 2012-07-07 DIAGNOSIS — F411 Generalized anxiety disorder: Secondary | ICD-10-CM | POA: Insufficient documentation

## 2012-07-07 DIAGNOSIS — R410 Disorientation, unspecified: Secondary | ICD-10-CM

## 2012-07-07 DIAGNOSIS — N179 Acute kidney failure, unspecified: Secondary | ICD-10-CM | POA: Insufficient documentation

## 2012-07-07 HISTORY — DX: Myalgia, other site: M79.18

## 2012-07-07 HISTORY — DX: Major depressive disorder, single episode, unspecified: F32.9

## 2012-07-07 HISTORY — DX: Dependent relative needing care at home: Z63.6

## 2012-07-07 HISTORY — DX: Depression, unspecified: F32.A

## 2012-07-07 HISTORY — DX: Metabolic syndrome: E88.81

## 2012-07-07 HISTORY — DX: Dyspnea, unspecified: R06.00

## 2012-07-07 HISTORY — DX: Allergic rhinitis, unspecified: J30.9

## 2012-07-07 HISTORY — DX: Other cervical disc degeneration, unspecified cervical region: M50.30

## 2012-07-07 HISTORY — DX: Anxiety disorder, unspecified: F41.9

## 2012-07-07 HISTORY — DX: Metabolic syndrome: E88.810

## 2012-07-07 LAB — URINALYSIS, ROUTINE W REFLEX MICROSCOPIC
Glucose, UA: NEGATIVE mg/dL
Hgb urine dipstick: NEGATIVE
Protein, ur: NEGATIVE mg/dL
Urobilinogen, UA: 1 mg/dL (ref 0.0–1.0)

## 2012-07-07 LAB — CBC WITH DIFFERENTIAL/PLATELET
Basophils Relative: 0 % (ref 0–1)
Eosinophils Absolute: 0.4 10*3/uL (ref 0.0–0.7)
Eosinophils Relative: 3 % (ref 0–5)
HCT: 37 % — ABNORMAL LOW (ref 39.0–52.0)
Hemoglobin: 13.1 g/dL (ref 13.0–17.0)
MCH: 32.6 pg (ref 26.0–34.0)
MCHC: 35.4 g/dL (ref 30.0–36.0)
MCV: 92 fL (ref 78.0–100.0)
Monocytes Absolute: 1.5 10*3/uL — ABNORMAL HIGH (ref 0.1–1.0)
Monocytes Relative: 12 % (ref 3–12)

## 2012-07-07 LAB — BASIC METABOLIC PANEL
BUN: 32 mg/dL — ABNORMAL HIGH (ref 6–23)
Creatinine, Ser: 2.36 mg/dL — ABNORMAL HIGH (ref 0.50–1.35)
GFR calc Af Amer: 31 mL/min — ABNORMAL LOW (ref >90)
GFR calc non Af Amer: 27 mL/min — ABNORMAL LOW (ref >90)
Glucose, Bld: 147 mg/dL — ABNORMAL HIGH (ref 70–99)

## 2012-07-07 LAB — POCT CBC
Hemoglobin: 13.5 g/dL — AB (ref 14.1–18.1)
Lymph, poc: 1.7 (ref 0.6–3.4)
MCH, POC: 31 pg (ref 27–31.2)
MCHC: 31.5 g/dL — AB (ref 31.8–35.4)
MCV: 98.4 fL — AB (ref 80–97)
MPV: 9 fL (ref 0–99.8)
POC MID %: 5.1 %M (ref 0–12)
WBC: 13 10*3/uL — AB (ref 4.6–10.2)

## 2012-07-07 LAB — CBC
MCH: 32.3 pg (ref 26.0–34.0)
MCHC: 35.6 g/dL (ref 30.0–36.0)
MCV: 90.7 fL (ref 78.0–100.0)
Platelets: 238 10*3/uL (ref 150–400)
RDW: 13.4 % (ref 11.5–15.5)

## 2012-07-07 LAB — CREATININE, SERUM: Creatinine, Ser: 1.72 mg/dL — ABNORMAL HIGH (ref 0.50–1.35)

## 2012-07-07 LAB — URINE MICROSCOPIC-ADD ON

## 2012-07-07 LAB — CREATININE, URINE, RANDOM: Creatinine, Urine: 204 mg/dL

## 2012-07-07 LAB — GLUCOSE, POCT (MANUAL RESULT ENTRY): POC Glucose: 223 mg/dl — AB (ref 70–99)

## 2012-07-07 MED ORDER — SODIUM CHLORIDE 0.9 % IJ SOLN
3.0000 mL | Freq: Two times a day (BID) | INTRAMUSCULAR | Status: DC
  Administered 2012-07-07 – 2012-07-08 (×2): 3 mL via INTRAVENOUS

## 2012-07-07 MED ORDER — CYCLOBENZAPRINE HCL 10 MG PO TABS: 10.0000 mg | ORAL_TABLET | Freq: Three times a day (TID) | ORAL | Status: DC | PRN

## 2012-07-07 MED ORDER — ALPRAZOLAM 0.5 MG PO TBDP: 0.5000 mg | ORAL_TABLET | Freq: Two times a day (BID) | ORAL | Status: DC | PRN

## 2012-07-07 MED ORDER — LORATADINE 10 MG PO TABS
10.0000 mg | ORAL_TABLET | Freq: Every day | ORAL | Status: DC
  Filled 2012-07-07 (×2): qty 1

## 2012-07-07 MED ORDER — FENOFIBRATE 160 MG PO TABS
160.0000 mg | ORAL_TABLET | Freq: Every day | ORAL | Status: DC
  Administered 2012-07-08: 160 mg via ORAL
  Filled 2012-07-07 (×2): qty 1

## 2012-07-07 MED ORDER — TRAMADOL HCL 50 MG PO TABS: 50.0000 mg | ORAL_TABLET | Freq: Three times a day (TID) | ORAL | Status: DC | PRN

## 2012-07-07 MED ORDER — ACETAMINOPHEN 325 MG PO TABS
650.0000 mg | ORAL_TABLET | Freq: Four times a day (QID) | ORAL | Status: DC | PRN
  Administered 2012-07-07: 650 mg via ORAL
  Filled 2012-07-07: qty 2

## 2012-07-07 MED ORDER — SODIUM CHLORIDE 0.9 % IV BOLUS (SEPSIS)
1000.0000 mL | INTRAVENOUS | Status: AC
  Administered 2012-07-07: 1000 mL via INTRAVENOUS

## 2012-07-07 MED ORDER — CARVEDILOL 6.25 MG PO TABS
6.2500 mg | ORAL_TABLET | Freq: Two times a day (BID) | ORAL | Status: DC
  Administered 2012-07-08: 6.25 mg via ORAL
  Filled 2012-07-07 (×3): qty 1

## 2012-07-07 MED ORDER — ALPRAZOLAM 0.5 MG PO TABS
0.5000 mg | ORAL_TABLET | Freq: Two times a day (BID) | ORAL | Status: DC | PRN
  Administered 2012-07-07: 0.5 mg via ORAL
  Filled 2012-07-07: qty 1

## 2012-07-07 MED ORDER — LATANOPROST 0.005 % OP SOLN
1.0000 [drp] | Freq: Every day | OPHTHALMIC | Status: DC
  Administered 2012-07-07: 1 [drp] via OPHTHALMIC
  Filled 2012-07-07: qty 2.5

## 2012-07-07 MED ORDER — OMEGA-3-ACID ETHYL ESTERS 1 G PO CAPS
2.0000 g | ORAL_CAPSULE | Freq: Two times a day (BID) | ORAL | Status: DC
  Administered 2012-07-08: 2 g via ORAL
  Filled 2012-07-07 (×3): qty 2

## 2012-07-07 MED ORDER — SODIUM CHLORIDE 0.9 % IV BOLUS (SEPSIS)
1000.0000 mL | Freq: Once | INTRAVENOUS | Status: AC
  Administered 2012-07-07: 1000 mL via INTRAVENOUS

## 2012-07-07 MED ORDER — OXYCODONE HCL 5 MG PO TABS: 5.0000 mg | ORAL_TABLET | ORAL | Status: DC | PRN

## 2012-07-07 MED ORDER — SODIUM CHLORIDE 0.9 % IV SOLN: 250.0000 mL | INTRAVENOUS | Status: DC | PRN

## 2012-07-07 MED ORDER — SODIUM CHLORIDE 0.9 % IJ SOLN: 3.0000 mL | INTRAMUSCULAR | Status: DC | PRN

## 2012-07-07 MED ORDER — HEPARIN SODIUM (PORCINE) 5000 UNIT/ML IJ SOLN
5000.0000 [IU] | Freq: Three times a day (TID) | INTRAMUSCULAR | Status: DC
  Administered 2012-07-07 – 2012-07-08 (×3): 5000 [IU] via SUBCUTANEOUS
  Filled 2012-07-07 (×5): qty 1

## 2012-07-07 NOTE — ED Notes (Signed)
Family medicine at bedside.

## 2012-07-07 NOTE — H&P (Signed)
Family Medicine Teaching [facility] Admission History and Physical Service Pager: [phone]  Patient name: Martin [name] Medical record number: [id number] Date of birth: 1945/10/16 Age: 67 y.o. Gender: male  Primary Care Provider: Sanda Linger, MD  Chief Complaint: pre-syncope and hypotension  History of Present Illness: Martin [name] is a 67 y.o. year old male with a h/o HTN, hyperlipidemia, allergic rhinitis, back pain, BPH, and anxiety presenting from [facility] with pre-syncope and hypotension. Patient states that while driving to his appointment at the urgent care center, he began to feel lightheaded and experienced blurred vision and mild confusion.  Sent to the ED because he was found to have low blood pressure at urgent care (84/44).  He denies any associated chest pain, shortness of breath, palpitations, focal weakness or parasthesias. Denies any nausea or vomiting with episode. He reports eating and drinking normally. He reports taking flexeril this morning, which was prescribed to him yesterday for hip pain. He took his regular dose of coreg, losartan and HCTZ this morning.  At this time, his symptoms have completely resolved; he has no lightheadedness, has normal vision, and BP has returned to normal/high with IV fluids.  Associated symptoms include urinary hesitancy, back pain, and left hip pain.    Patient notes urinary hesitancy for the past 2 to 3 weeks, describing that it often takes him a long time to urinate. He describes a feeling of not completely emptying bladder after urination as well as increased urinary frequency. Denies any pain or burning with urination.  Denies fever or chills. No blood in urine or changes in urine odor or color.   Patient describes his back pain as "sharp" and localized to the small of his back.  Pain is aggravated by laying in hospital bed.  H/o back surgery almost 30 years ago.  Also endorses left hip pain since mid-February that  radiates to the lower leg; not aggravated by movement. No associated weakness.  He was seen for his hip pain in the ED yesterday; given IM Toradol for pain and told to follow up with his PCP for likely trochanteric bursitis.   Patient Active Problem List  Diagnosis  . HYPERLIPIDEMIA  . HYPERTENSION, BENIGN ESSENTIAL  . Allergic rhinitis due to other allergen  . HYPERTROPHY PROSTATE W/UR OBST & OTH LUTS  . TRANSAMINASES, SERUM, ELEVATED  . Impaired glucose tolerance  . Preventative health care  . Anxiety  . Depression  . DDD (degenerative disc disease), cervical  . Myofascial pain dysfunction syndrome  . Metabolic syndrome  . Dyspnea  . Caregiver stress   Past Medical History: Past Medical History  Diagnosis Date  . Hyperlipidemia   . Hypertension   . BPH (benign prostatic hyperplasia)   . Glaucoma(365)     Bil.  . Cataract   . Impaired glucose tolerance 11/20/2010  . Allergic rhinitis due to allergen   . Anxiety   . Depressed   . DDD (degenerative disc disease), cervical   . Myofascial pain dysfunction syndrome   . Metabolic syndrome   . Caregiver stress   . Dyspnea    Past Surgical History: Past Surgical History  Procedure Laterality Date  . Shoulder arthroscopy   right shoulder    with bone spurs  . Tonsilectomy, adenoidectomy, bilateral myringotomy and tubes  1959  . Back surgery      29+years ago   Social History: History  Substance Use Topics  . Smoking status: Never Smoker   . Smokeless tobacco: Never  Used  . Alcohol Use: No   For any additional social history documentation, please refer to relevant sections of EMR.  Family History: Family History  Problem Relation Age of Onset  . Alcohol abuse Other   . Diabetes Other   . Hyperlipidemia Other   . Kidney disease Other   . Coronary artery disease Other   . Heart disease Mother    Allergies: Allergies  Allergen Reactions  . Enalapril Maleate     REACTION: rash   No current  facility-administered medications on file prior to encounter.   Current Outpatient Prescriptions on File Prior to Encounter  Medication Sig Dispense Refill  . ALPRAZolam (NIRAVAM) 0.5 MG dissolvable tablet Take 1 tablet (0.5 mg total) by mouth 2 (two) times daily as needed for anxiety.  50 tablet  1  . carvedilol (COREG) 6.25 MG tablet Take 6.25 mg by mouth 2 (two) times daily with a meal.      . cyclobenzaprine (FLEXERIL) 10 MG tablet Take 1 tablet (10 mg total) by mouth 3 (three) times daily as needed for muscle spasms.  30 tablet  0  . fenofibrate (TRICOR) 145 MG tablet Take 1 tablet (145 mg total) by mouth daily.  90 tablet  3  . HYDROcodone-acetaminophen (NORCO/VICODIN) 5-325 MG per tablet Take 1-2 tablets by mouth every 6 (six) hours as needed for pain.  15 tablet  0  . ibuprofen (ADVIL,MOTRIN) 800 MG tablet Take 1 tablet (800 mg total) by mouth every 8 (eight) hours as needed for pain.  30 tablet  0  . loratadine (CLARITIN) 10 MG tablet Take 10 mg by mouth daily.      [name] Kitchen losartan-hydrochlorothiazide (HYZAAR) 100-25 MG per tablet Take 1 tablet by mouth daily.  30 tablet  3  . traMADol (ULTRAM) 50 MG tablet Take 1 tablet (50 mg total) by mouth every 8 (eight) hours as needed for pain.  30 tablet  0   Review Of Systems: Per HPI.  Physical Exam: BP 145/66  Pulse 91  Temp(Src) 98.2 F (36.8 C) (Oral)  Resp 22  Ht 5\' 8"  (1.727 m)  Wt 200 lb (90.719 kg)  BMI 30.42 kg/m2  SpO2 100%  Exam: General: Well-developed, well-nourished.  Alert.  NAD. Anxious appearing HEENT: Normocephalic. PERRLA, EOMI.  Mildly congested and erythematous nasal turbinates. Oropharynx clear and moist.  No lymphadenopathy. Cardiovascular: RRR. Normal S1 and S2. No murmurs, rubs, or gallops. Respiratory: Coarse breath sounds bilaterally.  Normal respiratory effort. Abdomen: Soft, non-distended.  Mild suprapubic tenderness.  Normal bowel sounds. Rectal:  Good sphincter tone.  Prostate smooth, diffusely enlarged  with no palpable nodules. Musculoskeletal: Normal range of motion. Extremities: No edema.  Palpable pulses intact and equal bilaterally. Normal range of motion of right and left hip. No tenderness along left greater trochanter. No pain with hip abduction, adduction and flexion.  Skin: Warm and dry.  No diaphoresis. Neuro: Oriented to person, place, and time.  No focal deficits.  Strength 5/5 in upper and lower extremities B/L.  CN2-12 intact. Light touch sensation intact.    Labs and Imaging: CBC BMET   Recent Labs Lab 07/07/12 1220  WBC 13.3*  HGB 13.1  HCT 37.0*  PLT 232    Recent Labs Lab 07/07/12 1220  NA 138  K 3.5  CL 101  CO2 22  BUN 32*  CREATININE 2.36*  GLUCOSE 147*  CALCIUM 10.0     ED ECG REPORT  Date: 07/07/2012  Rate: 87  Rhythm: normal sinus rhythm  QRS Axis: normal  Intervals: normal  ST/T Wave abnormalities: nonspecific T wave changes  Conduction Disutrbances: right bundle branch block  Narrative Interpretation:  Old EKG Reviewed: Compared with 10/19/2011, no significant changes  CT HEAD (07/07/2012) Findings: Brain volume is normal for age. Negative for acute  infarct, hemorrhage, or mass lesion. No evidence of acute injury.  Negative for skull fracture.  Mild mucosal thickening in the paranasal sinuses.  IMPRESSION:  No acute abnormality.   CT MAXILLOFACIAL (07/07/2012) Findings: Negative for facial fracture. The orbit is intact.  The mandible shows no fracture. No soft tissue swelling or mass  lesion.  Mild mucosal thickening in the paranasal sinuses. There is debris  in the external auditory canal bilaterally.  IMPRESSION:  Negative for facial fracture.  Assessment and Plan: Martin [name] is a 67 y.o. year old male with a h/o HTN, hyperlipidemia, allergic rhinitis, back pain, BPH, and anxiety presenting with pre-syncope, hypotension and acute renal failure.   1. Pre-syncope secondary to hypotension: Patient with lightheadedness and  blurry vision that improved with IV hydration.  Oddly, patient's blood pressure on arrival to ED was 184/112 which then decreased to 110/54. Unclear as to etiology of this. BP currently stable in 130-140's systolic after IV hydration. Possible inciting factors include infection vs vasovagal origin vs dehydration vs medication side effects.  Patient afebrile, but WBCs elevated to 13.3.  Neuro exam intact with no focal deficits or concerns for TIA.  CT head and maxillofacial CT with no acute abnormalities.  EKG shows right bundle branch block, unchanged from previous. Symptoms could likely be related to newly added flexeril and vicodin.     - Continue IV fluids    - Cardiac monitoring for any tachy/brady arrhythmia    - obtain orthostatic vital signs    2. Acute renal failure: Cr 2.36 on admission; elevated compared to prior Cr <1, one year ago. Consider obstruction vs dehydration vs medications.  Likely multifactorial. No history of chronic renal disease.  Patient received Toradol IM yesterday in ED for hip pain, which may be possible cause of renal abnormality.  Due to patient's recent history of back and hip pain, may consider NSAID use as cause for AKI.  Patient also notes h/o BPH with enlarged prostate on exam today.    - Continue IV hydration.    - F/u renal ultrasound for possible hydronephrosis.    - F/u UA for presence of casts    - F/u urine sodium and urine Cr for FeNa    - F/u urine eosinophils     - F/u BMP tomorrow AM.  3. Back pain: H/o chronic back pain s/p back surgery 30 years ago.    - Avoid NSAIDs due to current AKI and Flexeril, and Ultram for urinary retention.      - schedule tylenol  4. Left hip pain: Patient describes left hip pain that radiates to the lower leg and is not aggravated by movement.  Evaluated in ED yesterday; received Toradol IM and told to follow up with PCP for likely trochanteric bursitis.  No tenderness on exam today.    - treat with tylenol  5. h/o BPH:  Patient complains of urinary hesitancy for the past 2 to 3 weeks.  Denies pain or burning with urination.  No blood in urine or changes in urine color or odor.  Enlarged prostate on exam today, which may be source of obstruction leading to current acute renal failure.  Patient on Flexeril for back and  hip pain, which may also be cause of urinary retention.    - Hold Flexeril (anticholinergic) due to urinary retention.    - F/u renal ultrasound for possible hydronephrosis.  6. HTN:     - Hold home BP meds in context of recent hypotension.    - monitor BP  7. Hyperlipidemia: Lipid panel in 07/2011 with cholesterol 272, TGs 494, HDL 44, VLDL 98.  Home meds include Fenofibrate 145 mg daily and Lovaza.    - Continue home meds.  8. Allergic rhinitis: Patient notes some facial pain and nasal congestion today.  Swollen turbinates with boggy nasal mucosa on physical exam.  Maxillofacial CT shows mild mucosal thickening in the paranasal sinuses.  Currently on Claritin 10 mg daily at home.    - Continue home meds.   9. Anxiety: H/o panic attacks. Currently on Alprazolam 0.5 mg twice daily prn for anxiety.    - Continue home meds.  FEN/GI: Regular diet. NS bolus. Prophylaxis: Heparin for DVT ppx. Disposition: Admit as inpatient for further workup. Code Status: Full code.  Marena Chancy, PGY-2 Family Medicine Resident

## 2012-07-07 NOTE — ED Provider Notes (Signed)
History     CSN: [id number]  Arrival date & time 07/07/12  1123   First MD Initiated Contact with Patient 07/07/12 1136      Chief Complaint  Patient presents with  . Hypotension    (Consider location/radiation/quality/duration/timing/severity/associated sxs/prior treatment) HPI Comments: Pt with hx of back pain s/p surgery many years ago who takes intermittent ultram for pain - has had a week or so of pain in the L hip radiating to the L lower leg laterally - not worse with palpation or ROM, seen in last couple of cays and given Rx for narcotic and flexeril - has taken intermittently - last used vicodin last night, ultram this AM - and [facility] had light headed, blurred vision and difficulty seeing followed by near syncope at the office - he was sent to ED b/c he was noted to by hypotensive at the UC to 84/44, no tachycardia, no fever.  The pt complains to me of congestion and facial pain and has been using clariting with minimal relief.  Deneis neck pain or stiffness, no  CP, SOB, cough, fever, swelling, rash.  His symptoms have completely resolved and at this time, he has no light headedness, has normal vision and has normal BP - was given fluid bolus enroute with immeidate improvement in sx.  The history is provided by the patient.    Past Medical History  Diagnosis Date  . Hyperlipidemia   . Hypertension   . BPH (benign prostatic hyperplasia)   . Glaucoma(365)     Bil.  . Cataract   . Impaired glucose tolerance 11/20/2010  . Allergic rhinitis due to allergen   . Anxiety   . Depressed   . DDD (degenerative disc disease), cervical   . Myofascial pain dysfunction syndrome   . Metabolic syndrome   . Caregiver stress   . Dyspnea     Past Surgical History  Procedure Laterality Date  . Shoulder arthroscopy   right shoulder    with bone spurs  . Tonsilectomy, adenoidectomy, bilateral myringotomy and tubes  1959  . Back surgery      29+years ago     Family History  Problem Relation Age of Onset  . Alcohol abuse Other   . Diabetes Other   . Hyperlipidemia Other   . Kidney disease Other   . Coronary artery disease Other   . Heart disease Mother     History  Substance Use Topics  . Smoking status: Never Smoker   . Smokeless tobacco: Never Used  . Alcohol Use: No      Review of Systems  All other systems reviewed and are negative.    Allergies  Enalapril maleate  Home Medications   Current Outpatient Rx  Name  Route  Sig  Dispense  Refill  . ALPRAZolam (NIRAVAM) 0.5 MG dissolvable tablet   Oral   Take 1 tablet (0.5 mg total) by mouth 2 (two) times daily as needed for anxiety.   50 tablet   1     Please dispense regular tab (not dissolvable tab); ...   . carvedilol (COREG) 6.25 MG tablet   Oral   Take 6.25 mg by mouth 2 (two) times daily with a meal.         . cyclobenzaprine (FLEXERIL) 10 MG tablet   Oral   Take 1 tablet (10 mg total) by mouth 3 (three) times daily as needed for muscle spasms.   30 tablet   0   .  fenofibrate (TRICOR) 145 MG tablet   Oral   Take 1 tablet (145 mg total) by mouth daily.   90 tablet   3   . HYDROcodone-acetaminophen (NORCO/VICODIN) 5-325 MG per tablet   Oral   Take 1-2 tablets by mouth every 6 (six) hours as needed for pain.   15 tablet   0   . ibuprofen (ADVIL,MOTRIN) 800 MG tablet   Oral   Take 1 tablet (800 mg total) by mouth every 8 (eight) hours as needed for pain.   30 tablet   0   . latanoprost (XALATAN) 0.005 % ophthalmic solution   Both Eyes   Place 1 drop into both eyes at bedtime.         [name] Kitchen loratadine (CLARITIN) 10 MG tablet   Oral   Take 10 mg by mouth daily.         [name] Kitchen losartan-hydrochlorothiazide (HYZAAR) 100-25 MG per tablet   Oral   Take 1 tablet by mouth daily.   30 tablet   3   . omega-3 acid ethyl esters (LOVAZA) 1 G capsule   Oral   Take 2 g by mouth 2 (two) times daily.         . traMADol (ULTRAM) 50 MG tablet    Oral   Take 1 tablet (50 mg total) by mouth every 8 (eight) hours as needed for pain.   30 tablet   0     BP 107/44  Pulse 86  Temp(Src) 98.2 F (36.8 C) (Oral)  Resp 20  Ht 5\' 8"  (1.727 m)  Wt 200 lb (90.719 kg)  BMI 30.42 kg/m2  SpO2 96%  Physical Exam  Nursing note and vitals reviewed. Constitutional: He appears well-developed and well-nourished. No distress.  HENT:  Head: Normocephalic and atraumatic.  Mouth/Throat: Oropharynx is clear and moist. No oropharyngeal exudate.  Nasal passages are occluded by swollen turbinates, mild discharge and bilateral nostrils, oropharynx clear and moist, tympanic membranes occluded bilaterally with cerumen  Eyes: Conjunctivae and EOM are normal. Pupils are equal, round, and reactive to light. Right eye exhibits no discharge. Left eye exhibits no discharge. No scleral icterus.  Neck: Normal range of motion. Neck supple. No JVD present. No thyromegaly present.  Very supple neck, no lymphadenopathy  Cardiovascular: Normal rate, regular rhythm, normal heart sounds and intact distal pulses.  Exam reveals no gallop and no friction rub.   No murmur heard. Pulmonary/Chest: Effort normal and breath sounds normal. No respiratory distress. He has no wheezes. He has no rales.  Abdominal: Soft. Bowel sounds are normal. He exhibits no distension and no mass. There is no tenderness.  Musculoskeletal: Normal range of motion. He exhibits no edema and no tenderness.  No focal tenderness over the left hip bursa, normal range of motion of the left hip and knee, no reproducible pain in the hip or knee on exam  Lymphadenopathy:    He has no cervical adenopathy.  Neurological: He is alert. Coordination normal.  Speech is clear and direct, moves all 4 extremities without difficulty, strength is 5 out of 5 in all 4 extremities including major muscle groups, sensation is intact to light touch diffusely, cranial nerves III through XII are maintained, memory is intact,  no limb ataxia  Skin: Skin is warm and dry. No rash noted. No erythema.  Psychiatric: He has a normal mood and affect. His behavior is normal.    ED Course  Procedures (including critical care time)  Labs Reviewed  CBC WITH DIFFERENTIAL -  Abnormal; Notable for the following:    WBC 13.3 (*)    RBC 4.02 (*)    HCT 37.0 (*)    Neutro Abs 9.3 (*)    Monocytes Absolute 1.5 (*)    All other components within normal limits  BASIC METABOLIC PANEL - Abnormal; Notable for the following:    Glucose, Bld 147 (*)    BUN 32 (*)    Creatinine, Ser 2.36 (*)    GFR calc non Af Amer 27 (*)    GFR calc Af Amer 31 (*)    All other components within normal limits  TROPONIN I  URINALYSIS, ROUTINE W REFLEX MICROSCOPIC   Dg Hip Complete Left  07/06/2012  *RADIOLOGY REPORT*  Clinical Data: Left hip pain without injury.  LEFT HIP - COMPLETE 2+ VIEW  Comparison: None.  Findings: Both hips are located.  No fracture is identified.  Joint spaces are preserved.  Surrounding osseous and soft tissue structures are unremarkable.  IMPRESSION: Negative exam.   Original Report Authenticated By: Holley Dexter, M.D.    Ct Head Wo Contrast  07/07/2012  *RADIOLOGY REPORT*  Clinical Data:  Trauma.  Headache  CT HEAD WITHOUT CONTRAST CT MAXILLOFACIAL WITHOUT CONTRAST  Technique:  Multidetector CT imaging of the head and maxillofacial structures were performed using the standard protocol without intravenous contrast. Multiplanar CT image reconstructions of the maxillofacial structures were also generated.  Comparison:  MRI 06/09/2010  CT HEAD  Findings: Brain volume is normal for age.  Negative for acute infarct, hemorrhage, or mass lesion.  No evidence of acute injury. Negative for skull fracture.  Mild mucosal thickening in the paranasal sinuses.  IMPRESSION: No acute abnormality.  CT MAXILLOFACIAL  Findings:   Negative for facial fracture.  The orbit is intact. The mandible shows no fracture.  No soft tissue swelling or  mass lesion.  Mild mucosal thickening in the paranasal sinuses. There is debris in the external auditory canal bilaterally.  IMPRESSION: Negative for facial fracture.   Original Report Authenticated By: Janeece Riggers, M.D.    Ct Maxillofacial Wo Cm  07/07/2012  *RADIOLOGY REPORT*  Clinical Data:  Trauma.  Headache  CT HEAD WITHOUT CONTRAST CT MAXILLOFACIAL WITHOUT CONTRAST  Technique:  Multidetector CT imaging of the head and maxillofacial structures were performed using the standard protocol without intravenous contrast. Multiplanar CT image reconstructions of the maxillofacial structures were also generated.  Comparison:  MRI 06/09/2010  CT HEAD  Findings: Brain volume is normal for age.  Negative for acute infarct, hemorrhage, or mass lesion.  No evidence of acute injury. Negative for skull fracture.  Mild mucosal thickening in the paranasal sinuses.  IMPRESSION: No acute abnormality.  CT MAXILLOFACIAL  Findings:   Negative for facial fracture.  The orbit is intact. The mandible shows no fracture.  No soft tissue swelling or mass lesion.  Mild mucosal thickening in the paranasal sinuses. There is debris in the external auditory canal bilaterally.  IMPRESSION: Negative for facial fracture.   Original Report Authenticated By: Janeece Riggers, M.D.      1. Acute renal failure   2. Dehydration   3. Leukocytosis   4. Nasal congestion       MDM  Vital signs are normal, blood pressure 124/71, pulse of 88 and a temperature of 98.2. There is a potential that the patient has a sinusitis, this does not explain the patient's transient hypotension and of note the patient's initial blood pressure on arrival at the urgent care facility was 184/112 per  the medical record report. His symptoms of lightheadedness and transient blurred vision are not answered completely at the patient's physical exam nor his history, I would suspect that medications play a role as he has recently been taking a muscle relaxer, hydrocodone  as well as the Claritin that he uses daily for his allergies. His blood pressure is normal here, he has responded to fluids, will need a brief laboratory workup as well as a CT scan of the sinuses to confirm or look for other sources of lightheadedness headache and facial pressure such as sinusitis.  EKG from the urgent care showed a right bundle branch block, no other significant findings.  ED ECG REPORT  I personally interpreted this EKG   Date: 07/07/2012   Rate: 87  Rhythm: normal sinus rhythm  QRS Axis: normal  Intervals: normal  ST/T Wave abnormalities: nonspecific T wave changes  Conduction Disutrbances:right bundle branch block  Narrative Interpretation:   Old EKG Reviewed: Compared with 10/19/2011, no significant changes  Laboratory workup shows that the patient has acute renal failure with a creatinine of 2.3 compared to prior creatinine less than 1, BUN is elevated at 32 and white blood cells at 13,300. CT scan of the head and sinuses is unremarkable, the patient is not anemic and has no anion gap acidosis. Urinalysis has been ordered, troponin is normal, patient will require admission to the hospital for hydration.  Discussed with family practice resident, will admit  Meds given in ED:  Medications  sodium chloride 0.9 % bolus 1,000 mL (not administered)  sodium chloride 0.9 % bolus 1,000 mL (not administered)         Vida Roller, MD 07/07/12 1423

## 2012-07-07 NOTE — ED Notes (Signed)
Pt up to bathroom to have BM without any problems.  IV NS infusion complete.  IV saline locked.

## 2012-07-07 NOTE — Progress Notes (Signed)
Subjective:    Patient ID: Martin [name], male    DOB: June 02, 1945, 67 y.o.   MRN: [id number]  HPI Brought back as an emergency,Dizzy and fuzzy thinking. Stat ekg nsr, rbbb but nl otherwise. No cp, no ha, no abdominal pain. BP retaken and 98/65, big change. IV started emergently. On many cns meds for pain and muscle spasms and taking them together causing cns side affects and hypotension. Had diarrhea yesterdayand constipation which may contributeto low bp   Review of Systems     Objective:   Physical Exam  Constitutional: He is oriented to person, place, and time. He appears well-developed and well-nourished. He appears lethargic.  HENT:  Nose: Nose normal.  Mouth/Throat: Oropharynx is clear and moist.  Eyes: Conjunctivae and EOM are normal. Pupils are equal, round, and reactive to light.  Neck: Normal range of motion. Neck supple.  Cardiovascular: Normal rate, regular rhythm, normal heart sounds and intact distal pulses.   Pulmonary/Chest: Effort normal and breath sounds normal. No respiratory distress.  Abdominal: Soft. Bowel sounds are normal. He exhibits no distension and no mass. There is no tenderness.  Neurological: He is oriented to person, place, and time. He has normal strength and normal reflexes. He appears lethargic. He displays no tremor. No cranial nerve deficit or sensory deficit. He exhibits normal muscle tone. He displays no seizure activity. Coordination and gait abnormal. He displays no Babinski's sign on the right side. He displays no Babinski's sign on the left side.  Skin: Skin is warm and dry. No rash noted.  Psychiatric: He has a normal mood and affect. His speech is normal. Thought content normal. He is slowed. Cognition and memory are normal.   BP 110/54 now at 1030am. On 2l oxygen   Results for orders placed in visit on 07/07/12  GLUCOSE, POCT (MANUAL RESULT ENTRY)      Result Value Range   POC Glucose 223 (*) 70 - 99 mg/dl  POCT CBC      Result Value  Range   WBC 13.0 (*) 4.6 - 10.2 K/uL   Lymph, poc 1.7  0.6 - 3.4   POC LYMPH PERCENT 13.2  10 - 50 %L   MID (cbc) 0.7  0 - 0.9   POC MID % 5.1  0 - 12 %M   POC Granulocyte 10.6 (*) 2 - 6.9   Granulocyte percent 81.7 (*) 37 - 80 %G   RBC 4.35 (*) 4.69 - 6.13 M/uL   Hemoglobin 13.5 (*) 14.1 - 18.1 g/dL   HCT, POC 40.9 (*) 81.1 - 53.7 %   MCV 98.4 (*) 80 - 97 fL   MCH, POC 31.0  27 - 31.2 pg   MCHC 31.5 (*) 31.8 - 35.4 g/dL   RDW, POC 91.4     Platelet Count, POC 291  142 - 424 K/uL   MPV 9.0  0 - 99.8 fL   Results for orders placed in visit on 07/07/12  GLUCOSE, POCT (MANUAL RESULT ENTRY)      Result Value Range   POC Glucose 223 (*) 70 - 99 mg/dl  POCT CBC      Result Value Range   WBC 13.0 (*) 4.6 - 10.2 K/uL   Lymph, poc 1.7  0.6 - 3.4   POC LYMPH PERCENT 13.2  10 - 50 %L   MID (cbc) 0.7  0 - 0.9   POC MID % 5.1  0 - 12 %M   POC Granulocyte 10.6 (*) 2 - 6.9  Granulocyte percent 81.7 (*) 37 - 80 %G   RBC 4.35 (*) 4.69 - 6.13 M/uL   Hemoglobin 13.5 (*) 14.1 - 18.1 g/dL   HCT, POC 40.9 (*) 81.1 - 53.7 %   MCV 98.4 (*) 80 - 97 fL   MCH, POC 31.0  27 - 31.2 pg   MCHC 31.5 (*) 31.8 - 35.4 g/dL   RDW, POC 91.4     Platelet Count, POC 291  142 - 424 K/uL   MPV 9.0  0 - 99.8 fL   Chart reviewed in detail      Assessment & Plan:  Hypotension/Dehydration/Med side affects Dizzy/High blood sugar not on meds/Chronic pain Transport to ER to stabilize and possibly detox from prescription med side affects When stable back to family doctor to review each med Counseled and discussed everything

## 2012-07-07 NOTE — Patient Instructions (Signed)
Dehydration, Adult Dehydration is when you lose more fluids from the body than you take in. Vital organs like the kidneys, brain, and heart cannot function without a proper amount of fluids and salt. Any loss of fluids from the body can cause dehydration.  CAUSES   Vomiting.  Diarrhea.  Excessive sweating.  Excessive urine output.  Fever. SYMPTOMS  Mild dehydration  Thirst.  Dry lips.  Slightly dry mouth. Moderate dehydration  Very dry mouth.  Sunken eyes.  Skin does not bounce back quickly when lightly pinched and released.  Dark urine and decreased urine production.  Decreased tear production.  Headache. Severe dehydration  Very dry mouth.  Extreme thirst.  Rapid, weak pulse (more than 100 beats per minute at rest).  Cold hands and feet.  Not able to sweat in spite of heat and temperature.  Rapid breathing.  Blue lips.  Confusion and lethargy.  Difficulty being awakened.  Minimal urine production.  No tears. DIAGNOSIS  Your caregiver will diagnose dehydration based on your symptoms and your exam. Blood and urine tests will help confirm the diagnosis. The diagnostic evaluation should also identify the cause of dehydration. TREATMENT  Treatment of mild or moderate dehydration can often be done at home by increasing the amount of fluids that you drink. It is best to drink small amounts of fluid more often. Drinking too much at one time can make vomiting worse. Refer to the home care instructions below. Severe dehydration needs to be treated at the hospital where you will probably be given intravenous (IV) fluids that contain water and electrolytes. HOME CARE INSTRUCTIONS   Ask your caregiver about specific rehydration instructions.  Drink enough fluids to keep your urine clear or pale yellow.  Drink small amounts frequently if you have nausea and vomiting.  Eat as you normally do.  Avoid:  Foods or drinks high in sugar.  Carbonated  drinks.  Juice.  Extremely hot or cold fluids.  Drinks with caffeine.  Fatty, greasy foods.  Alcohol.  Tobacco.  Overeating.  Gelatin desserts.  Wash your hands well to avoid spreading bacteria and viruses.  Only take over-the-counter or prescription medicines for pain, discomfort, or fever as directed by your caregiver.  Ask your caregiver if you should continue all prescribed and over-the-counter medicines.  Keep all follow-up appointments with your caregiver. SEEK MEDICAL CARE IF:  You have abdominal pain and it increases or stays in one area (localizes).  You have a rash, stiff neck, or severe headache.  You are irritable, sleepy, or difficult to awaken.  You are weak, dizzy, or extremely thirsty. SEEK IMMEDIATE MEDICAL CARE IF:   You are unable to keep fluids down or you get worse despite treatment.  You have frequent episodes of vomiting or diarrhea.  You have blood or green matter (bile) in your vomit.  You have blood in your stool or your stool looks black and tarry.  You have not urinated in 6 to 8 hours, or you have only urinated a small amount of very dark urine.  You have a fever.  You faint. MAKE SURE YOU:   Understand these instructions.  Will watch your condition.  Will get help right away if you are not doing well or get worse. Document Released: 03/02/2005 Document Revised: 05/25/2011 Document Reviewed: 10/20/2010 [employer] Patient Information 2013 [employer].  

## 2012-07-07 NOTE — ED Notes (Addendum)
Pt reports to the ED for eval of hypotension. Pt went to [facility] for generalized weakness, dizziness, blurred vision, and left hip pain. Pt was seen at [facility] 2 days ago and was given Flexeril, Hydrocodone, and Ibuprofen and d/c home. Pt was initially 80/40 and after 600 cc bolus his last BP in route was 123/50 mm/hg. Pt has RBBB on 12 lead per EMS. Pt A&O x4.

## 2012-07-07 NOTE — ED Notes (Signed)
Pt to ultrasound via stretcher for renal US

## 2012-07-08 DIAGNOSIS — R55 Syncope and collapse: Secondary | ICD-10-CM

## 2012-07-08 DIAGNOSIS — E860 Dehydration: Secondary | ICD-10-CM | POA: Diagnosis present

## 2012-07-08 DIAGNOSIS — N179 Acute kidney failure, unspecified: Secondary | ICD-10-CM

## 2012-07-08 LAB — CBC
MCH: 31.9 pg (ref 26.0–34.0)
MCHC: 35.5 g/dL (ref 30.0–36.0)
MCV: 89.9 fL (ref 78.0–100.0)
Platelets: 246 10*3/uL (ref 150–400)
RBC: 3.95 MIL/uL — ABNORMAL LOW (ref 4.22–5.81)
RDW: 13.4 % (ref 11.5–15.5)

## 2012-07-08 LAB — BASIC METABOLIC PANEL
BUN: 20 mg/dL (ref 6–23)
CO2: 24 mEq/L (ref 19–32)
Calcium: 8.8 mg/dL (ref 8.4–10.5)
Creatinine, Ser: 1.26 mg/dL (ref 0.50–1.35)
GFR calc non Af Amer: 57 mL/min — ABNORMAL LOW (ref >90)
Glucose, Bld: 140 mg/dL — ABNORMAL HIGH (ref 70–99)

## 2012-07-08 MED ORDER — TAMSULOSIN HCL 0.4 MG PO CAPS: 0.4000 mg | ORAL_CAPSULE | Freq: Every day | ORAL | Status: DC

## 2012-07-08 MED ORDER — CALCIUM CARBONATE ANTACID 500 MG PO CHEW
1.0000 | CHEWABLE_TABLET | Freq: Two times a day (BID) | ORAL | Status: DC | PRN
  Filled 2012-07-08 (×2): qty 1

## 2012-07-08 MED ORDER — ALPRAZOLAM 0.5 MG PO TABS
0.5000 mg | ORAL_TABLET | Freq: Once | ORAL | Status: AC
  Administered 2012-07-08: 0.5 mg via ORAL
  Filled 2012-07-08: qty 1

## 2012-07-08 MED ORDER — CARVEDILOL 6.25 MG PO TABS: 3.1250 mg | ORAL_TABLET | Freq: Two times a day (BID) | ORAL | Status: DC

## 2012-07-08 MED ORDER — POTASSIUM CHLORIDE CRYS ER 20 MEQ PO TBCR
20.0000 meq | EXTENDED_RELEASE_TABLET | Freq: Once | ORAL | Status: AC
  Administered 2012-07-08: 20 meq via ORAL
  Filled 2012-07-08: qty 1

## 2012-07-08 MED ORDER — LOSARTAN POTASSIUM-HCTZ 100-25 MG PO TABS: 0.5000 | ORAL_TABLET | Freq: Every day | ORAL | Status: DC

## 2012-07-08 MED ORDER — FAMOTIDINE 10 MG PO TABS
10.0000 mg | ORAL_TABLET | Freq: Every day | ORAL | Status: DC
  Administered 2012-07-08: 10 mg via ORAL
  Filled 2012-07-08: qty 1

## 2012-07-08 NOTE — Discharge Summary (Signed)
Discharge Summary 07/09/2012 12:43 AM  Martin [name] DOB: Jun 19, 1945 MRN: [id number]  Date of Admission: 07/07/2012 Date of Discharge: 07/08/2012 07/09/2012  PCP: Sanda Linger, MD Consultants: None  Reason for Admission: Hypotension, near-syncope, and AKI  Discharge Diagnosis Primary 1. Pre-syncope secondary to hypotension Secondary 1. Acute renal failure 2. Back pain 3. Left hip pain 4. Benign prostatic hypertrophy 5. Hypertension 6. Hyperlipidemia 7. Allergic rhinitis 8. Anxiety   Hospital Course: Martin [name] is a 67 y.o. year old male with a h/o HTN, hyperlipidemia, allergic rhinitis, back pain, BPH, and anxiety presenting with pre-syncope, hypotension and acute renal failure.  1. Pre-syncope secondary to hypotension: Patient presented with lightheadedness and blurry vision that improved with IV hydration. DDx includes infection vs vasovagal origin vs dehydration vs medication side effects. Patient afebrile, but leukocytosis on admission. No focal neurologic deficits, and CT head and maxillofacial CT with no acute abnormalities. EKG shows right bundle branch block, unchanged from previous. Symptoms could likely be related to newly added flexeril and vicodin. Orthostatics were negative. Discharged patient with instructions to remain hydrated.  2. Acute renal failure: Cr was 2.36 on admission; elevated compared to prior Cr <1, one year ago. Likely multifactorial with obstruction from BPH vs dehydration vs medications. No history of chronic renal disease. Patient received Toradol IM in ED for hip pain, which may have contributed to AKI. Due to patient's recent history of back and hip pain, may consider chronic NSAID use as cause for AKI. Patient also notes h/o BPH with enlarged prostate on exam and bladder distension by [place] without hydronephrosis. Bladder scan had 134cc remaining after void. Urine with no casts. FeNa appeared prerenal. Cr improved to 1.26 with hydration. Recommended  hydration, refraining from NSAID or anticholinergic use, and trialing tamsulosin. Held losartan-HCTZ this hospitalization and on discharge - consider restart after checking Cr at hospital f/u.   3. Back pain: H/o chronic back pain s/p back surgery 30 years ago. Avoid NSAIDs with AKI; avoid flexaril and ultram for possible urinary retention. Scheduled tylenol with no issues.  4. Left hip pain: Patient describes left hip pain that radiates to the lower leg and is not aggravated by movement. Evaluated in ED; received Toradol IM and told to follow up with PCP for likely trochanteric bursitis. No tenderness currently. Continued treatment with tylenol, avoiding NSAIDs, tramadol, or flexeril per above.  5. h/o BPH: Patient complains of urinary hesitancy for the past 2 to 3 weeks. Denies pain or burning with urination. No blood in urine or changes in urine color or odor. Enlarged prostate on exam, distended bladder on renal US with no hydronephrosis, and some urinary retention noted on post-void. Patient on Flexeril for back and hip pain, which may also be cause of urinary retention. Held flexeril and started tamsulosin on discharge. Follow-up with PCP.  6. HTN: Held home BP meds in context of recent hypotension. BP on discharge elevated 150-180s systolic. Restarted 1/2 home dose carvedilol and held losartan-HCTZ in setting of recent hypotension and AKI. F/u with PCP.  7. Hyperlipidemia: Lipid panel in 07/2011 with cholesterol 272, TGs 494, HDL 44, VLDL 98. Continued home meds Fenofibrate 145 mg daily and Lovaza.  8. Allergic rhinitis: Patient notes some facial pain and nasal congestion. Swollen turbinates with boggy nasal mucosa on exam. Maxillofacial CT shows mild mucosal thickening in the paranasal sinuses. Continued home Claritin 10 mg daily.   9. Anxiety: H/o panic attacks. Currently on Alprazolam 0.5 mg twice daily prn for anxiety. Continued with no  issues.   Discharge day physical exam: Filed Vitals:    07/08/12 1000  BP: 124/60  Pulse: 88  Temp: 98 F (36.7 C)  Resp: 20  General: NAD, lying in bed asleep, easily arousable  HEENT: AT/[place], sclera clear, EOMI  CV: RRR with no murmurs, rubs, or gallops  Pulm: CTAB anteriorly with coarse breath sounds but no increased effort  Abd: Soft, nontender, moderately distended, NABS  Ext: No LE edema or cyanosis; left 4th and 5th digits contractured s/p accident per pt  Neuro: Awake, alert, CN 2-12 in tact except unable to puff cheeks (?volitional), mild dysarthria ?baseline   Procedures: Bladder scan - after urinating  Discharge Medications   Medication List    STOP taking these medications       cyclobenzaprine 10 MG tablet  Commonly known as:  FLEXERIL     HYDROcodone-acetaminophen 5-325 MG per tablet  Commonly known as:  NORCO/VICODIN     ibuprofen 800 MG tablet  Commonly known as:  ADVIL,MOTRIN     losartan-hydrochlorothiazide 100-25 MG per tablet  Commonly known as:  HYZAAR     traMADol 50 MG tablet  Commonly known as:  ULTRAM      TAKE these medications       ALPRAZolam 0.5 MG dissolvable tablet  Commonly known as:  NIRAVAM  Take 1 tablet (0.5 mg total) by mouth 2 (two) times daily as needed for anxiety.     carvedilol 6.25 MG tablet  Commonly known as:  COREG  Take 0.5 tablets (3.125 mg total) by mouth 2 (two) times daily with a meal. Until you follow up with your primary doctor.     fenofibrate 145 MG tablet  Commonly known as:  TRICOR  Take 1 tablet (145 mg total) by mouth daily.     loratadine 10 MG tablet  Commonly known as:  CLARITIN  Take 10 mg by mouth daily.     omega-3 acid ethyl esters 1 G capsule  Commonly known as:  LOVAZA  Take 2 g by mouth 2 (two) times daily.     tamsulosin 0.4 MG Caps  Commonly known as:  FLOMAX  Take 1 capsule (0.4 mg total) by mouth daily.     XALATAN 0.005 % ophthalmic solution  Generic drug:  latanoprost  Place 1 drop into both eyes at bedtime.         Pertinent Hospital Labs / Imaging: Recent Labs  Lab  07/07/12 1220  07/07/12 1916  07/08/12 0700   WBC  13.3*  12.6*  11.7*   HGB  13.1  12.5*  12.6*   HCT  37.0*  35.1*  35.5*   PLT  232  238  246     Recent Labs  Lab  07/07/12 1220  07/07/12 1916  07/08/12 0700   NA  138  --  135   K  3.5  --  3.4*   CL  101  --  100   CO2  22  --  24   BUN  32*  --  20   CREATININE  2.36*  1.72*  1.26   GLUCOSE  147*  --  140*   CALCIUM  10.0  --  8.8     Recent Labs  Lab  07/07/12 1215   TROPONINI  <0.30    ED ECG REPORT:  Date: 07/07/2012  Rate: 87  Rhythm: normal sinus rhythm  QRS Axis: normal  Intervals: normal  ST/T Wave abnormalities: nonspecific T wave  changes  Conduction Disutrbances: right bundle branch block  Narrative Interpretation:  Old EKG Reviewed: Compared with 10/19/2011, no significant changes   CT HEAD (07/07/2012)  Findings: Brain volume is normal for age. Negative for acute  infarct, hemorrhage, or mass lesion. No evidence of acute injury.  Negative for skull fracture.  Mild mucosal thickening in the paranasal sinuses.  IMPRESSION:  No acute abnormality.   CT MAXILLOFACIAL (07/07/2012)  Findings: Negative for facial fracture. The orbit is intact.  The mandible shows no fracture. No soft tissue swelling or mass  lesion.  Mild mucosal thickening in the paranasal sinuses. There is debris  in the external auditory canal bilaterally.  IMPRESSION:  Negative for facial fracture.  Bladder scan - after urinating  Renal ultrasound: IMPRESSION:  1. Distended bladder.  2. No evidence of hydronephrosis. Both kidneys show suggestion of  mildly increased cortical echogenicity which may reflect some  degree of underlying chronic kidney disease.  Discharge instructions: You were admitted with low blood pressure and a near-passing out spell and found to have acute kidney injury which corrected with IV fluids. By discharge, your creatinine was 1.26,  improved from 2.3. Your kidney ultrasound did not show a large fluid-filled kidney (hydronephrosis) but showed some bladder distension. After you urinated, you had a little bit more than normal amount of urine in your bladder according to a bladder scan.  It will be important to follow up with your primary doctor in a few days to recheck your blood pressure and kidney function.  At this appointment, you can also discuss following up with a kidney or urology doctor.   Please START tamsulosin (flomax) which may help with your enlarged prostate that is making it difficult to urinate.  Please REDUCE your carvedilol by 1/2 and STOP taking your losartan-HCTZ until you follow up with your primary doctor.  Please STOP taking vicodin, flexeril, and tramadol, all of which can affect your blood pressure or your retaining urine.  Please also STOP taking any non-steroidal antiinflammatory medicines like advil, ibuprofen, and toradol. You can take tylenol for pain but do not exceed the recommended dose on the packaging.  Stay well hydrated.  If you develop concerning symptoms like very high fevers, bad pain, dizziness, fainting spells, or feeling like you are unable to pee and your bladder is getting too full, please seek medical attention.   Condition at discharge: stable  Disposition: Home  Pending Tests: Urine cultuer  Follow up: Follow-up Information   Follow up with Sanda Linger, MD. Schedule an appointment as soon as possible for a visit in 3 days. (for a hospital follow-up)    Contact information:   [address] [place] [address] [phone]       Follow up Issues:  - F/u renal function and BP to determine when to resume previous antihypertensives - F/u K given mildly low on discharge but repleted with kdur - Avoid nephrotoxic agents or anticholinergics due to AKI and urinary retention - Follow up urine flow with flomax - F/u urine culture - With  renal US showing signs of ?chronic kidney disease, consider workup vs continue trying to control BP   Simone Curia 07/09/2012 12:43 AM PGY-1 Family Medicine Teaching Service Service pager [phone]

## 2012-07-08 NOTE — Progress Notes (Signed)
[facility] Daily Intern Progress Note  Subjective: Martin [name] denies chest pain or SOB. Had some anxiety overnight with mild SOB and heartburn. Dosed 0.5mg  ativan, pepcid, and tums. This morning pt states heartburn relieved. Wants to go home. Urinating this morning and had BM yesterday.  I have reviewed the patient's medications. PRN tylenol and xanax x 2 overnight.  Objective Temp:  [97.9 F (36.6 C)-98.4 F (36.9 C)] 97.9 F (36.6 C) (04/25 0528) Pulse Rate:  [80-107] 92 (04/25 0528) Resp:  [12-24] 20 (04/25 0528) BP: (84-184)/(44-112) 156/79 mmHg (04/25 0528) SpO2:  [95 %-100 %] 99 % (04/25 0528) Weight:  [200 lb (90.719 kg)-204 lb 11.2 oz (92.851 kg)] 204 lb 11.2 oz (92.851 kg) (04/24 2037)   Intake/Output Summary (Last 24 hours) at 07/08/12 0916 Last data filed at 07/08/12 0600  Gross per 24 hour  Intake   2000 ml  Output    700 ml  Net   1300 ml   General: NAD, lying in bed asleep, easily arousable HEENT: AT/[place], sclera clear, EOMI CV: RRR with no murmurs, rubs, or gallops Pulm: CTAB anteriorly with coarse breath sounds but no increased effort Abd: Soft, nontender, moderately distended, NABS Ext: No LE edema or cyanosis; left 4th and 5th digits contractured s/p accident per pt Neuro: Awake, alert, CN 2-12 in tact except unable to puff cheeks (?volitional), mild dysarthria ?baseline  Labs and Imaging  Recent Labs Lab 07/07/12 1220 07/07/12 1916 07/08/12 0700  WBC 13.3* 12.6* 11.7*  HGB 13.1 12.5* 12.6*  HCT 37.0* 35.1* 35.5*  PLT 232 238 246     Recent Labs Lab 07/07/12 1220 07/07/12 1916 07/08/12 0700  NA 138  --  135  K 3.5  --  3.4*  CL 101  --  100  CO2 22  --  24  BUN 32*  --  20  CREATININE 2.36* 1.72* 1.26  GLUCOSE 147*  --  140*  CALCIUM 10.0  --  8.8     Recent Labs Lab 07/07/12 1215  TROPONINI <0.30   Bladder scan - pending   Renal ultrasound: IMPRESSION:  1. Distended bladder.  2. No evidence of hydronephrosis. Both kidneys show  suggestion of  mildly increased cortical echogenicity which may reflect some  degree of underlying chronic kidney disease.  Assessment and Plan - Martin [name] is a 67 y.o. year old male with a h/o HTN, hyperlipidemia, allergic rhinitis, back pain, BPH, and anxiety presenting with pre-syncope, hypotension and acute renal failure, currently with improving renal function and pressures improved.   1. Pre-syncope secondary to hypotension: Patient with lightheadedness and blurry vision that improved with IV hydration. Oddly, patient's blood pressure on arrival to ED was elevated to 180s/100s. Unclear etiology, but BP currently stable after IV hydration. DDx includes infection vs vasovagal origin vs dehydration vs medication side effects. Patient afebrile, but leukocytosis on admission. No focal neurologic deficits, and CT head and maxillofacial CT with no acute abnormalities. EKG shows right bundle branch block, unchanged from previous. Symptoms could likely be related to newly added flexeril and vicodin.  - Continue IV fluids  - Cardiac monitoring for any tachy/brady arrhythmia  - Orthostatics negative  2. Acute renal failure: Cr 2.36 on admission; elevated compared to prior Cr <1, one year ago. Consider obstruction vs dehydration vs medications. Likely multifactorial. No history of chronic renal disease. Patient received Toradol IM in ED for hip pain, which may be possible cause of renal abnormality. Due to patient's recent history of back and  hip pain, may consider NSAID use as cause for AKI. Patient also notes h/o BPH with enlarged prostate on exam today and bladder distension by [place].  - Continue IV hydration.  - Renal US with bladder distension and signs of CRF, with no hydronephrosis - UA with no casts -FeNA is prerenal, urine Na appears intra or postrenal - F/u urine eosinophils  -Cr improving, likely with hydration  3. Back pain: H/o chronic back pain s/p back surgery 30 years ago.  - Avoid  NSAIDs due to current AKI and Flexeril, and Ultram for urinary retention.  - schedule tylenol   4. Left hip pain: Patient describes left hip pain that radiates to the lower leg and is not aggravated by movement. Evaluated in ED; received Toradol IM and told to follow up with PCP for likely trochanteric bursitis. No tenderness currently - treat with tylenol   5. h/o BPH: Patient complains of urinary hesitancy for the past 2 to 3 weeks. Denies pain or burning with urination. No blood in urine or changes in urine color or odor. Enlarged prostate on exam yesterday, which may be source of obstruction leading to current acute renal failure. Patient on Flexeril for back and hip pain, which may also be cause of urinary retention.  - Hold Flexeril (anticholinergic) due to urinary retention.  - No hydronephrosis on renal US - Consider tamsulosin  6. HTN:  - Hold home BP meds in context of recent hypotension.  - monitor BP   7. Hyperlipidemia: Lipid panel in 07/2011 with cholesterol 272, TGs 494, HDL 44, VLDL 98. Home meds include Fenofibrate 145 mg daily and Lovaza.  - Continue home meds.   8. Allergic rhinitis: Patient notes some facial pain and nasal congestion today. Swollen turbinates with boggy nasal mucosa on physical exam. Maxillofacial CT shows mild mucosal thickening in the paranasal sinuses. Currently on Claritin 10 mg daily at home.  - Continue home meds.   9. Anxiety: H/o panic attacks. Currently on Alprazolam 0.5 mg twice daily prn for anxiety.  - Continue home meds.   FEN/GI: Regular diet. NS bolus.  Prophylaxis: Heparin for DVT ppx.  Disposition: With resolving AKI and pressures no longer low, discharge today pending any further renal injury workup Code Status: Full code.   Simone Curia Pager: [phone] 07/08/2012, 9:16 AM

## 2012-07-08 NOTE — H&P (Signed)
I have seen and examined this patient. I have discussed with Dr Gwenlyn Saran.  I agree with their findings and plans as documented in their admission note.  Acute Issues 1. Near-syncope - New event, no prior history of similar - Multifactorial origin: New psychoactive medications (tramadol and flexeril and vicodin), and hypotension from antihypertensive medications including ACEI and HCTZ and beta-blocker and recent poor oral intake.   2. Acute Kidney Injury - Prerenal from hypovolemia  Secondary to  antihypertensive medications including ACEI and HCTZ and recent poor oral intake along with NSAID use  3.  BPH symptoms - urinary hesitancy from anatomic BPH and anticholinergic effects of opiates and Flexeril (TCA)  Plan: Stop vicodan, flexeril and tramadol Stop NSAIDS APAP only for pain for now.  Check post-void residual.

## 2012-07-08 NOTE — Progress Notes (Signed)
Patient discharged home with friend. Patient was given discharge instructions and information of where to get his prescriptions. Patient was given and told signs and symptoms of when to contact the doctor. Patient was given a handout on BPH.  Patient was given thorough instructions on his new medications. Patient was stable upon discharge.

## 2012-07-08 NOTE — Progress Notes (Signed)
I have seen and examined this patient. I have discussed with Dr Thekkekandam.  I agree with their findings and plans as documented in their progress note.    

## 2012-07-08 NOTE — Progress Notes (Signed)
Utilization Review Completed.   Earland Reish, RN, BSN Nurse Case Manager  [phone]  

## 2012-07-09 NOTE — Discharge Summary (Signed)
I discussed with  Thekkekandam.  I agree with their plans documented in their discharge note for today.

## 2012-07-11 LAB — URINE CULTURE: Colony Count: NO GROWTH

## 2012-07-21 ENCOUNTER — Encounter: Payer: Self-pay | Admitting: Family Medicine

## 2012-07-21 ENCOUNTER — Ambulatory Visit (INDEPENDENT_AMBULATORY_CARE_PROVIDER_SITE_OTHER): Payer: Medicare Other | Admitting: Family Medicine

## 2012-07-21 VITALS — BP 120/76 | HR 89 | Temp 98.0°F | Resp 16 | Ht 68.0 in | Wt 202.0 lb

## 2012-07-21 DIAGNOSIS — E876 Hypokalemia: Secondary | ICD-10-CM

## 2012-07-21 DIAGNOSIS — R7309 Other abnormal glucose: Secondary | ICD-10-CM

## 2012-07-21 DIAGNOSIS — E669 Obesity, unspecified: Secondary | ICD-10-CM

## 2012-07-21 DIAGNOSIS — E785 Hyperlipidemia, unspecified: Secondary | ICD-10-CM

## 2012-07-21 DIAGNOSIS — I10 Essential (primary) hypertension: Secondary | ICD-10-CM

## 2012-07-21 MED ORDER — CARVEDILOL 6.25 MG PO TABS: 3.1250 mg | ORAL_TABLET | Freq: Two times a day (BID) | ORAL | Status: DC

## 2012-07-21 MED ORDER — TAMSULOSIN HCL 0.4 MG PO CAPS: 0.4000 mg | ORAL_CAPSULE | Freq: Every day | ORAL | Status: DC

## 2012-07-21 MED ORDER — LOSARTAN POTASSIUM-HCTZ 100-25 MG PO TABS: 1.0000 | ORAL_TABLET | Freq: Every day | ORAL | Status: DC

## 2012-07-21 MED ORDER — FENOFIBRATE 145 MG PO TABS: 145.0000 mg | ORAL_TABLET | Freq: Every day | ORAL | Status: DC

## 2012-07-21 NOTE — Progress Notes (Signed)
S:  This 67 y.o. Cauc male has HTN; he was recently hospitalized overnight for dehydration, acute kidney injury and acute urinary retention. He was hydrated and found to have BPH, much improved on Tamsulosin. Pt does not think he is supposed to see Urologist unless he has problems. Pt feels much better and relaizes that he must take better care of himself. Nutrition has been fair. He has been preoccupied with taking care of mother as well as his chronic hip pain; he has MRI scheduled w/ visit to see specialist about possible hip surgery.  PMHx, Soc Hx, Fam Hx and Problem List reviewed.  ROS: Negative for diaphoresis, abnormal w eight loss, dysphagia or other oropharynx issues, CP or tightness, palpitations, SOB or DOE, cough, abd pain,n/v/d, muscle cramps, HA, dizziness, weakness or syncope. He endorses ongoing anxiety and mild sleep disturbance; no SI/HI.  O:  Filed Vitals:   07/21/12 1405  BP: 120/76  Pulse: 89  Temp: 98 F (36.7 C)  Resp: 16   GEN: In NAD; WN/WD.  Weight down 6 pounds in 5 months. HENT: [place]/AT; EOMI w/ clear conj/ sclerae. Wears corrective lenses. EACs/ nose/ oroph clear. COR: RRR. LUNGS: Normal resp rate and effort.  SKIN: W&D; no rashes or erythema or pallor. MS: MAEs. No c/c/e. No major joint deformities. NEURO: A&O x 3; CNs intact. Nonfocal. Pt  Is anxious but speech  And thought pattern are normal. He exhibits good judgement.  A/P: Hypokalemia - Plan: Basic metabolic panel  Obesity, unspecified- encourages better food choices and regular exercise most days of the week. Pt advised about elevated blood sugar on previous occasions and given print material about Hyperglycemia.  HYPERLIPIDEMIA - Plan: fenofibrate (TRICOR) 145 MG tablet   Meds ordered this encounter  Medications  . HYDROcodone-acetaminophen (NORCO) 10-325 MG per tablet    Sig:   . DISCONTD: losartan-hydrochlorothiazide (HYZAAR) 100-25 MG per tablet    Sig:   . carvedilol (COREG) 6.25 MG tablet     Sig: Take 0.5 tablets (3.125 mg total) by mouth 2 (two) times daily with a meal. Until you follow up with your primary doctor.    Dispense:  30 tablet    Refill:  5  . fenofibrate (TRICOR) 145 MG tablet    Sig: Take 1 tablet (145 mg total) by mouth daily.    Dispense:  90 tablet    Refill:  3  . losartan-hydrochlorothiazide (HYZAAR) 100-25 MG per tablet    Sig: Take 1 tablet by mouth daily.    Dispense:  30 tablet    Refill:  5  . tamsulosin (FLOMAX) 0.4 MG CAPS    Sig: Take 1 capsule (0.4 mg total) by mouth daily.    Dispense:  30 capsule    Refill:  5

## 2012-07-21 NOTE — Patient Instructions (Addendum)
Hyperglycemia Hyperglycemia occurs when the glucose (sugar) in your blood is too high. Hyperglycemia can happen for many reasons, but it most often happens to people who do not know they have diabetes or are not managing their diabetes properly.  CAUSES  Whether you have diabetes or not, there are other causes of hyperglycemia. Hyperglycemia can occur when you have diabetes, but it can also occur in other situations that you might not be as aware of, such as: Diabetes  If you have diabetes and are having problems controlling your blood glucose, hyperglycemia could occur because of some of the following reasons:  Not following your meal plan.  Not taking your diabetes medications or not taking it properly.  Exercising less or doing less activity than you normally do.  Being sick. Pre-diabetes  This cannot be ignored. Before people develop Type 2 diabetes, they almost always have "pre-diabetes." This is when your blood glucose levels are higher than normal, but not yet high enough to be diagnosed as diabetes. Research has shown that some long-term damage to the body, especially the heart and circulatory system, may already be occurring during pre-diabetes. If you take action to manage your blood glucose when you have pre-diabetes, you may delay or prevent Type 2 diabetes from developing. Stress  If you have diabetes, you may be "diet" controlled or on oral medications or insulin to control your diabetes. However, you may find that your blood glucose is higher than usual in the hospital whether you have diabetes or not. This is often referred to as "stress hyperglycemia." Stress can elevate your blood glucose. This happens because of hormones put out by the body during times of stress. If stress has been the cause of your high blood glucose, it can be followed regularly by your caregiver. That way he/she can make sure your hyperglycemia does not continue to get worse or progress to  diabetes. Steroids  Steroids are medications that act on the infection fighting system (immune system) to block inflammation or infection. One side effect can be a rise in blood glucose. Most people can produce enough extra insulin to allow for this rise, but for those who cannot, steroids make blood glucose levels go even higher. It is not unusual for steroid treatments to "uncover" diabetes that is developing. It is not always possible to determine if the hyperglycemia will go away after the steroids are stopped. A special blood test called an A1c is sometimes done to determine if your blood glucose was elevated before the steroids were started. SYMPTOMS  Thirsty.  Frequent urination.  Dry mouth.  Blurred vision.  Tired or fatigue.  Weakness.  Sleepy.  Tingling in feet or leg. DIAGNOSIS  Diagnosis is made by monitoring blood glucose in one or all of the following ways:  A1c test. This is a chemical found in your blood.  Fingerstick blood glucose monitoring.  Laboratory results. TREATMENT  First, knowing the cause of the hyperglycemia is important before the hyperglycemia can be treated. Treatment may include, but is not be limited to:  Education.  Change or adjustment in medications.  Change or adjustment in meal plan.  Treatment for an illness, infection, etc.  More frequent blood glucose monitoring.  Change in exercise plan.  Decreasing or stopping steroids.  Lifestyle changes. HOME CARE INSTRUCTIONS   Test your blood glucose as directed.  Exercise regularly. Your caregiver will give you instructions about exercise. Pre-diabetes or diabetes which comes on with stress is helped by exercising.  Eat wholesome,   balanced meals. Eat often and at regular, fixed times. Your caregiver or nutritionist will give you a meal plan to guide your sugar intake.  Being at an ideal weight is important. If needed, losing as little as 10 to 15 pounds may help improve blood  glucose levels. SEEK MEDICAL CARE IF:   You have questions about medicine, activity, or diet.  You continue to have symptoms (problems such as increased thirst, urination, or weight gain). SEEK IMMEDIATE MEDICAL CARE IF:   You are vomiting or have diarrhea.  Your breath smells fruity.  You are breathing faster or slower.  You are very sleepy or incoherent.  You have numbness, tingling, or pain in your feet or hands.  You have chest pain.  Your symptoms get worse even though you have been following your caregiver's orders.  If you have any other questions or concerns. Document Released: 08/26/2000 Document Revised: 05/25/2011 Document Reviewed: 06/29/2011 [facility] Patient Information 2013 [place], [place].   GLUCERNA- this is the meal replacement drink for 1 meal a day; this is the best product to use if you have Diabetes or at risk for Diabetes.

## 2012-07-22 ENCOUNTER — Ambulatory Visit: Payer: Medicare Other | Admitting: Family Medicine

## 2012-07-22 LAB — BASIC METABOLIC PANEL
BUN: 15 mg/dL (ref 6–23)
CO2: 25 mEq/L (ref 19–32)
Calcium: 9.3 mg/dL (ref 8.4–10.5)
Glucose, Bld: 136 mg/dL — ABNORMAL HIGH (ref 70–99)

## 2012-07-22 NOTE — Progress Notes (Signed)
Quick Note:  Please contact pt and advise that the following labs are abnormal... Blood sugar above normal as we discussed.  Copy To pt. ______

## 2012-07-29 ENCOUNTER — Other Ambulatory Visit: Payer: Self-pay | Admitting: Neurosurgery

## 2012-07-29 DIAGNOSIS — M5416 Radiculopathy, lumbar region: Secondary | ICD-10-CM

## 2012-08-06 ENCOUNTER — Ambulatory Visit
Admission: RE | Admit: 2012-08-06 | Discharge: 2012-08-06 | Disposition: A | Discharge status: Home / Self Care | Payer: Medicare Other | Source: Ambulatory Visit | Attending: Neurosurgery | Admitting: Neurosurgery

## 2012-08-06 DIAGNOSIS — M5416 Radiculopathy, lumbar region: Secondary | ICD-10-CM

## 2012-08-20 ENCOUNTER — Other Ambulatory Visit (HOSPITAL_COMMUNITY): Payer: Self-pay | Admitting: Family Medicine

## 2012-09-03 ENCOUNTER — Other Ambulatory Visit (HOSPITAL_COMMUNITY): Payer: Self-pay | Admitting: Family Medicine

## 2012-09-07 ENCOUNTER — Other Ambulatory Visit: Payer: Self-pay | Admitting: *Deleted

## 2012-10-16 ENCOUNTER — Other Ambulatory Visit: Payer: Self-pay | Admitting: Family Medicine

## 2012-10-21 ENCOUNTER — Ambulatory Visit (INDEPENDENT_AMBULATORY_CARE_PROVIDER_SITE_OTHER): Payer: Medicare Other | Admitting: Family Medicine

## 2012-10-21 ENCOUNTER — Encounter: Payer: Self-pay | Admitting: Family Medicine

## 2012-10-21 VITALS — BP 115/73 | HR 89 | Temp 98.9°F | Resp 16 | Ht 68.0 in | Wt 203.4 lb

## 2012-10-21 DIAGNOSIS — R5381 Other malaise: Secondary | ICD-10-CM

## 2012-10-21 DIAGNOSIS — R7309 Other abnormal glucose: Secondary | ICD-10-CM

## 2012-10-21 DIAGNOSIS — N289 Disorder of kidney and ureter, unspecified: Secondary | ICD-10-CM

## 2012-10-21 DIAGNOSIS — R5383 Other fatigue: Secondary | ICD-10-CM

## 2012-10-21 DIAGNOSIS — E785 Hyperlipidemia, unspecified: Secondary | ICD-10-CM

## 2012-10-21 DIAGNOSIS — F489 Nonpsychotic mental disorder, unspecified: Secondary | ICD-10-CM

## 2012-10-21 DIAGNOSIS — R7302 Impaired glucose tolerance (oral): Secondary | ICD-10-CM

## 2012-10-21 DIAGNOSIS — F419 Anxiety disorder, unspecified: Secondary | ICD-10-CM

## 2012-10-21 DIAGNOSIS — F411 Generalized anxiety disorder: Secondary | ICD-10-CM

## 2012-10-21 LAB — CBC WITH DIFFERENTIAL/PLATELET
Basophils Absolute: 0.1 10*3/uL (ref 0.0–0.1)
Basophils Relative: 1 % (ref 0–1)
Eosinophils Relative: 4 % (ref 0–5)
HCT: 41.8 % (ref 39.0–52.0)
Lymphocytes Relative: 29 % (ref 12–46)
MCHC: 35.6 g/dL (ref 30.0–36.0)
MCV: 89.7 fL (ref 78.0–100.0)
Monocytes Absolute: 0.6 10*3/uL (ref 0.1–1.0)
Platelets: 295 10*3/uL (ref 150–400)
RDW: 13.5 % (ref 11.5–15.5)
WBC: 7.2 10*3/uL (ref 4.0–10.5)

## 2012-10-21 LAB — POCT GLYCOSYLATED HEMOGLOBIN (HGB A1C): Hemoglobin A1C: 6.4

## 2012-10-21 MED ORDER — ALPRAZOLAM 0.5 MG PO TABS: ORAL_TABLET | ORAL | Status: DC

## 2012-10-21 MED ORDER — FENOFIBRATE 145 MG PO TABS: 145.0000 mg | ORAL_TABLET | Freq: Every day | ORAL | Status: DC

## 2012-10-21 MED ORDER — TAMSULOSIN HCL 0.4 MG PO CAPS: ORAL_CAPSULE | ORAL | Status: DC

## 2012-10-21 NOTE — Patient Instructions (Addendum)
We have discussed managing your blood sugar with portion control and getting better sleep in addition to trying to be more active. Drink more water and eliminate sugary drinks.

## 2012-10-22 LAB — BASIC METABOLIC PANEL
BUN: 14 mg/dL (ref 6–23)
Calcium: 9.9 mg/dL (ref 8.4–10.5)
Chloride: 100 mEq/L (ref 96–112)
Creat: 0.96 mg/dL (ref 0.50–1.35)

## 2012-10-24 NOTE — Progress Notes (Signed)
Quick Note:  Please advise pt that the following labs are abnormal...  As we discussed, your blood sugar is running above normal. We talked about changes you can make in your food choices and activity level. All other lab results are normal. You blood counts are normal; you are no longer anemic and white cell count is normal. Your kidney function, sodium and potassium are normal.  Copy to pt.  ______

## 2012-10-24 NOTE — Progress Notes (Signed)
S:  This 67 y.o. Cauc male is here for follow-up of elevated blood sugar and evaluation of fatigue. Pt is not exercising though he feels like walking a couple of laps around the mall would improve his energy level. Pt reports that he has decreased stamina, first noted while trying to clear some limbs from the yard. He sleeps poorly; he has not been tasking Alprazolam. He continue to have significant anxiety w/ the care of his elderly mother. His nutrition is fair; he eats a lot of high-calorie foods and admits that this is probably "comfort eating".  His sleep hygiene is adversely affected by chronic neck/c-spine DDD; this condition is being monitored by a specialist.  PMHx , Soc Hx and Fam Hx reviewed. Problem List reviewed and medicatinos reconciled.  ROS: As per HPI; negative for fever/chills, diaphoresis, abnormal weight change; vision changes, CP or tightness, palpitations, SOB or DOE, cough, back pain or myalgias, numbness or paresthesias in arms or legs, HA, dizziness, lightheadedness, tremor or syncope.  He admits anxiety but no dysphoric mood. He has no agitiation or behavior changes. He has occasional concentration difficulties.  O:  Filed Vitals:   10/21/12 1354  BP: 115/73                                 Weight down 3 lbs in last 6 months.  Pulse: 89  Temp: 98.9 F (37.2 C)  Resp: 16   GEN: In NAD; WN,WD. HENT: [place]/AT; EOMI w/ clear conj/sclerae. Otherwise unremarkable. COR: RRR. Normal S1, S2 w/o m/g/r. LUNGS: CTA; no wheezes or rales. SKIN: W&D. No erythema or pallor. MS: MAEs; no c/c/e. No joint deformities or effusions. NEURO: A&O x 3; CNs intact. Nonfocal.  A/P: Other malaise and fatigue - Discussed improved nutrition w/ attention to portion sizes and healthy caloric intake. Encouraged resumption of walking, starting w/ 1 lap around 1 level of the mall and increase as stamina increases. Pt has hx of mild anemia. Plan: CBC with Differential  Impaired glucose tolerance -  Plan: POCT glycosylated hemoglobin (Hb A1C)= 6.4%; advised better food choices; discontinue sodas or sugary beverages.  Renal insufficiency, mild - Plan: Basic metabolic panel  Insomnia secondary to anxiety- Resume Alprazolam prn sleep.  HYPERLIPIDEMIA - Plan: fenofibrate (TRICOR) 145 MG tablet

## 2012-12-16 ENCOUNTER — Other Ambulatory Visit: Payer: Self-pay

## 2012-12-16 ENCOUNTER — Telehealth: Payer: Self-pay

## 2012-12-16 DIAGNOSIS — E785 Hyperlipidemia, unspecified: Secondary | ICD-10-CM

## 2012-12-16 MED ORDER — CARVEDILOL 6.25 MG PO TABS: 3.1250 mg | ORAL_TABLET | Freq: Two times a day (BID) | ORAL | Status: DC

## 2012-12-16 MED ORDER — OMEGA-3-ACID ETHYL ESTERS 1 G PO CAPS: ORAL_CAPSULE | ORAL | Status: DC

## 2012-12-16 MED ORDER — FENOFIBRATE 145 MG PO TABS: 145.0000 mg | ORAL_TABLET | Freq: Every day | ORAL | Status: DC

## 2012-12-16 MED ORDER — TAMSULOSIN HCL 0.4 MG PO CAPS: ORAL_CAPSULE | ORAL | Status: DC

## 2012-12-16 MED ORDER — LOSARTAN POTASSIUM-HCTZ 100-25 MG PO TABS: 1.0000 | ORAL_TABLET | Freq: Every day | ORAL | Status: DC

## 2012-12-16 MED ORDER — ALPRAZOLAM 0.5 MG PO TABS: ORAL_TABLET | ORAL | Status: DC

## 2012-12-16 NOTE — Telephone Encounter (Signed)
[facility] ([facility]) request a RF of pt's alprazolam. I have sent in RFs of all of his regular maintenance meds Rxd for him at [facility] since he is switching to [facility] but am sending this to Dr Audria Nine for review.

## 2012-12-16 NOTE — Telephone Encounter (Signed)
Alprazolam script printed at 104 and will be faxed to [facility] on Monday.

## 2012-12-17 ENCOUNTER — Telehealth: Payer: Self-pay

## 2012-12-17 NOTE — Telephone Encounter (Signed)
Patient has requested a call from a clinical staff member. Patient wants to set up his medications to be delivered by mail order. Patient stated that his doctor is Dr. Audria Nine. Please call patient at [phone].

## 2012-12-18 NOTE — Telephone Encounter (Signed)
Advised pt to call when he needs refills on his medications.  He did not need anything refilled.  He uses [facility].

## 2012-12-21 ENCOUNTER — Other Ambulatory Visit: Payer: Self-pay

## 2012-12-21 MED ORDER — OMEGA-3-ACID ETHYL ESTERS 1 G PO CAPS: ORAL_CAPSULE | ORAL | Status: DC

## 2013-01-20 ENCOUNTER — Ambulatory Visit: Payer: Medicare Other | Admitting: Family Medicine

## 2013-01-24 ENCOUNTER — Telehealth: Payer: Self-pay

## 2013-01-24 MED ORDER — ALPRAZOLAM 0.5 MG PO TABS: ORAL_TABLET | ORAL | Status: DC

## 2013-01-24 NOTE — Telephone Encounter (Signed)
[facility] requests Rfs of alprazolam 0.5 mg.Dr Audria Nine, I have pended the last Rx you sent for this for you review. Fax number # [phone]

## 2013-01-24 NOTE — Telephone Encounter (Signed)
Alprazolam script printed at 104 and will be faxed to [facility].

## 2013-01-26 ENCOUNTER — Ambulatory Visit (INDEPENDENT_AMBULATORY_CARE_PROVIDER_SITE_OTHER): Payer: Medicare Other | Admitting: Family Medicine

## 2013-01-26 ENCOUNTER — Encounter: Payer: Self-pay | Admitting: Family Medicine

## 2013-01-26 ENCOUNTER — Ambulatory Visit: Payer: Medicare Other

## 2013-01-26 VITALS — BP 115/74 | HR 75 | Temp 97.7°F | Resp 16 | Ht 68.0 in | Wt 208.0 lb

## 2013-01-26 DIAGNOSIS — Z6379 Other stressful life events affecting family and household: Secondary | ICD-10-CM

## 2013-01-26 DIAGNOSIS — E785 Hyperlipidemia, unspecified: Secondary | ICD-10-CM

## 2013-01-26 DIAGNOSIS — Z636 Dependent relative needing care at home: Secondary | ICD-10-CM

## 2013-01-26 DIAGNOSIS — I10 Essential (primary) hypertension: Secondary | ICD-10-CM

## 2013-01-26 DIAGNOSIS — G8929 Other chronic pain: Secondary | ICD-10-CM

## 2013-01-26 DIAGNOSIS — R209 Unspecified disturbances of skin sensation: Secondary | ICD-10-CM

## 2013-01-26 DIAGNOSIS — R200 Anesthesia of skin: Secondary | ICD-10-CM

## 2013-01-26 DIAGNOSIS — M25539 Pain in unspecified wrist: Secondary | ICD-10-CM

## 2013-01-26 MED ORDER — OMEGA-3-ACID ETHYL ESTERS 1 G PO CAPS: ORAL_CAPSULE | ORAL | Status: DC

## 2013-01-26 MED ORDER — CARVEDILOL 6.25 MG PO TABS: 3.1250 mg | ORAL_TABLET | Freq: Two times a day (BID) | ORAL | Status: DC

## 2013-01-26 MED ORDER — NEOPRENE WRIST SPLINT MISC: 1.0000 [IU] | Freq: Every day | Status: DC

## 2013-01-26 NOTE — Progress Notes (Signed)
S: This 67 y.o. Cauc male returns for follow-up today, main concern being R wrist pain with numbness and tingling. Hx of wrist fracture decades ago; he is R-hand dominant but has no recent injury. Sister has CTS. Pt has C-spine DDD but pain located primarily on L side. He does have pain in R shoulder area and upper arm related to remote hx of shoulder surgery. Medication prescribed for chronic pain (NORCO) but pt not taking. He rates pain 4-5/5 most days.  Pt still has fair sleep hygiene (multi-factorial). He cares for his mother which contributes to chronic persistent anxiety. He is compliant w/ anxiolytic.  Pt has hyperlipidemia associated w/ Metabolic Syndrome and impaired glucose tolerance; pt has not had lipid panel checked in over 1 year. Weight is up ~10 lbs in last year due to inactivity and chronic joint pain/myofascial pain dysfunction syndrome (per Dr. Wynn Banker).  HTN- pt is compliant w/ medications w/o adverse effects. No reports of diaphoresis, CP or tightness, palpitations, SOB or DOE, cough, edema, severe HA or dizziness, speech or vision disturbance or syncope.  PMHx, Surg Hx, Soc Hx and Medications reviewed/reconciled.   O: Filed Vitals:   01/26/13 1553  BP: 115/74  Pulse: 75  Temp: 97.7 F (36.5 C)  Resp: 16   GEN: In NAD; WN,WD. HENT: [place]/AT; EOMI w/ clear conj/sclerae. EACs/nose/ oroph normal; otherwise unremarkable. NECK; Supple w/ decreased ROM. COR: RRR. LUNGS: Unlabored resp. SKIN: W&D; intact w/o erythema, rashes or pallor. MS: R wrist- decreased ROM; no obvious deformity. Neurovascular intact. Tinel's negative.  NEURO: A&O x 3; CNs intact. Gait normal Nonfocal.   [facility] reading (PRIMARY) by  Dr. Audria Nine: R wrist- arthritic changes involving radial head and carpal bones; no acute fracture or dislocation.   A/P: Numbness and tingling in right hand- Suspect early CTS. Advised pull-on wrist sleeve; OTC analgesic extra strength APAP 2 tabs three times daily prn  as well as topical analgesic.   Wrist pain, chronic, right - Suspect post-injury/post-surgical arthralgia. Plan: DG Wrist Complete Right, Elastic Bandages & Supports (NEOPRENE WRIST SPLINT) MISC  Other and unspecified hyperlipidemia - Plan: Lipid panel, Comprehensive metabolic panel  Caregiver stress  HYPERTENSION, BENIGN ESSENTIAL - Stable and controlled; no med change at this time. Plan: Comprehensive metabolic panel  Meds ordered this encounter  Medications  . DISCONTD: carvedilol (COREG) 6.25 MG tablet    Sig: Take 0.5 tablets (3.125 mg total) by mouth 2 (two) times daily with a meal.    Dispense:  90 tablet    Refill:  1  . omega-3 acid ethyl esters (LOVAZA) 1 G capsule    Sig: TAKE 2 CAPSULES BY MOUTH TWICE A DAY    Dispense:  360 capsule    Refill:  1    LOVAZA BRAND MEDICALLY NECESSARY.  . carvedilol (COREG) 6.25 MG tablet    Sig: Take 0.5 tablets (3.125 mg total) by mouth 2 (two) times daily with a meal.    Dispense:  90 tablet    Refill:  1  . Elastic Bandages & Supports (NEOPRENE WRIST SPLINT) MISC    Sig: 1 Units by Does not apply route daily.    Dispense:  1 each    Refill:  0

## 2013-01-26 NOTE — Patient Instructions (Signed)
You have arthritis in your wrist; you may have early carpel tunnel syndrome. Get a wrist splint or pull on wrist support that you should wear at bedtime. Arthritis medication like Tylenol and topical analgesic (like Ben-Gay or Tiger Balm) can help reduce the discomfort. If pain persists, I may refer you to a HAND  And WRIST Specialist.

## 2013-01-27 ENCOUNTER — Other Ambulatory Visit (INDEPENDENT_AMBULATORY_CARE_PROVIDER_SITE_OTHER): Payer: Medicare Other

## 2013-01-27 DIAGNOSIS — E785 Hyperlipidemia, unspecified: Secondary | ICD-10-CM

## 2013-01-27 DIAGNOSIS — I10 Essential (primary) hypertension: Secondary | ICD-10-CM

## 2013-01-27 LAB — COMPREHENSIVE METABOLIC PANEL
AST: 46 U/L — ABNORMAL HIGH (ref 0–37)
Alkaline Phosphatase: 37 U/L — ABNORMAL LOW (ref 39–117)
BUN: 20 mg/dL (ref 6–23)
Calcium: 9.8 mg/dL (ref 8.4–10.5)
Chloride: 101 mEq/L (ref 96–112)
Creat: 1.14 mg/dL (ref 0.50–1.35)
Glucose, Bld: 142 mg/dL — ABNORMAL HIGH (ref 70–99)
Total Bilirubin: 0.5 mg/dL (ref 0.3–1.2)

## 2013-01-27 LAB — LIPID PANEL
Cholesterol: 249 mg/dL — ABNORMAL HIGH (ref 0–200)
HDL: 37 mg/dL — ABNORMAL LOW (ref >39)
Total CHOL/HDL Ratio: 6.7 Ratio
Triglycerides: 350 mg/dL — ABNORMAL HIGH (ref <150)
VLDL: 70 mg/dL — ABNORMAL HIGH (ref 0–40)

## 2013-01-27 NOTE — Progress Notes (Signed)
Lab work only per Dr. Audria Nine.

## 2013-01-29 NOTE — Progress Notes (Signed)
Quick Note:  Please advise pt regarding following labs... All lipid numbers are too high and HDL ("good") cholesterol is too low. In general, the values are better than 1 year ago but your heart disease risk is still above average. Your blood sugar is above normal; we discussed that you have impaired glucose metabolism. You have mild Diabetes but have not been started on medication yet. You have a follow-up appointment in February 2015; if your A1c is still above 6.5%, medication will need to be discussed. Cholesterol profile may be checked again at that time.  You have committed to improving your diet and getting more physical activity. These lifestyle changes are extremely important and will help improve glucose (blood sugar) metabolism.  Continue the medications you are currently taking. Contact the clinic if you have any questions or concerns. Enjoy the Holidays!  Copy to pt. ______

## 2013-01-30 ENCOUNTER — Encounter: Payer: Self-pay | Admitting: *Deleted

## 2013-02-01 ENCOUNTER — Telehealth: Payer: Self-pay

## 2013-02-01 NOTE — Telephone Encounter (Signed)
PT STATES HE NEED TO COME BY AND PICK UP A WRITTEN PRESCRIPTION FOR OMEGA 3 SINCE THE OTHER MEDICINE GIVEN BY DR [facility] WAS TO EXPENSIVE PLEASE CALL [phone]

## 2013-02-01 NOTE — Telephone Encounter (Signed)
I have set this to print, he wants written Rx.

## 2013-02-02 MED ORDER — OMEGA-3-ACID ETHYL ESTERS 1 G PO CAPS: ORAL_CAPSULE | ORAL | Status: DC

## 2013-02-02 NOTE — Telephone Encounter (Signed)
I printed prescription as pt's requested. It is for generic Omega-3 ethyl esters 1 gram capsules (not the Brand Lovaza). He can pick it up at [facility].

## 2013-02-02 NOTE — Telephone Encounter (Deleted)
I have printed LOVAZA prescription, stating that BRAND MEDICALLY NECESSARY.  He can pick it up from [facility].

## 2013-02-02 NOTE — Telephone Encounter (Signed)
Notified pt on VM that Rx is ready. 

## 2013-02-17 ENCOUNTER — Telehealth: Payer: Self-pay

## 2013-02-17 NOTE — Telephone Encounter (Signed)
Patient is calling to let us know that his xanex is not going to get covered by his insurance would like to know if we  Can pick something else that is on his list that ins will cover

## 2013-02-20 MED ORDER — CLORAZEPATE DIPOTASSIUM 7.5 MG PO TABS: 7.5000 mg | ORAL_TABLET | Freq: Two times a day (BID) | ORAL | Status: DC | PRN

## 2013-02-20 NOTE — Telephone Encounter (Signed)
I have prescribed generic Tranxene which should be covered. He can pick up RX from [facility]. Please notify pt.

## 2013-02-27 ENCOUNTER — Other Ambulatory Visit: Payer: Self-pay

## 2013-02-27 MED ORDER — TAMSULOSIN HCL 0.4 MG PO CAPS: ORAL_CAPSULE | ORAL | Status: DC

## 2013-03-07 ENCOUNTER — Other Ambulatory Visit: Payer: Self-pay | Admitting: *Deleted

## 2013-03-07 ENCOUNTER — Ambulatory Visit (INDEPENDENT_AMBULATORY_CARE_PROVIDER_SITE_OTHER): Payer: Medicare Other | Admitting: Family Medicine

## 2013-03-07 ENCOUNTER — Encounter: Payer: Self-pay | Admitting: Family Medicine

## 2013-03-07 VITALS — BP 108/84 | HR 100 | Temp 97.8°F | Resp 16 | Ht 67.75 in | Wt 207.2 lb

## 2013-03-07 DIAGNOSIS — G5601 Carpal tunnel syndrome, right upper limb: Secondary | ICD-10-CM

## 2013-03-07 DIAGNOSIS — G5600 Carpal tunnel syndrome, unspecified upper limb: Secondary | ICD-10-CM

## 2013-03-07 MED ORDER — TRAMADOL HCL 50 MG PO TABS: 50.0000 mg | ORAL_TABLET | Freq: Three times a day (TID) | ORAL | Status: DC | PRN

## 2013-03-07 MED ORDER — PREDNISONE 20 MG PO TABS: ORAL_TABLET | ORAL | Status: DC

## 2013-03-07 NOTE — Telephone Encounter (Signed)
error 

## 2013-03-07 NOTE — Patient Instructions (Signed)
INSTRUCTIONS for taking PREDNISONE:  Day 1, 2 and 3                Take 1 tablet 3 times a day with food.                                                                        Day 4, 5 and 6                Take 1 tablet 2 times a day with food.                                                                        Day  7, 8 and 9               Take 1 tablet daily with food.                                                                        Day  10,11, 12 and 13    Take 1/2 tablet daily with food or snack.  I will see you again in 4 weeks to see if you have improved. If not, I will be referring you to a HAND Specialist.  Try to wear your splint during the day as much as you can.  I have prescribed Tramadol for pain; you can take 1 tablet every 8 hours or just take 1 tablet at bedtime for pain.

## 2013-03-11 NOTE — Progress Notes (Signed)
S:  This 67 y.o. Cauc male has C-spine DDD, followed by Dr. Lovell Sheehan and confirmed on MRI in Jan 2014. The imaging study shows "multilevel spondylosis w/ uncinate spurring and probable PLL ossification contributing to mild central stenosis at C4-C5 and C5-C6; stable cervical cord atrophy and signal abnormality, likely chronic myelomalacia". The pt also has L-spine DDD. Today, he presents w/ complaint of R hand numbness, involving thumb, index and middle fingers. Symptom onset 2 weeks ago. Pt notes decreased grip and weakness in hand. He is R-handed. He has been bothered w/ CTS symptoms in the past and has a splint; has tried wearing splint to bed but cannot sleep while wearing this device. Pt was prescribed hydrocodone by Dr. Lovell Sheehan for neck pain but he prefers not to use medication. No relief w/ OTC meds.  PROBLEM LIST, PMHx, Surg HX, Soc and Fam Hx reviewed.  ROS: As per HPI; negative for facial numbness/ asymmetry or slurred speech, numbness in ipsilateral lower ext, HA, dizziness, tremor or syncope.  O: Filed Vitals:   03/07/13 1552  BP: 108/84  Pulse: 100  Temp: 97.8 F (36.6 C)  Resp: 16  GEN: In NAD; WN,WD. HENT: /AT; EOMI w/ clear conj/sclerae. Otherwise unremarkable. COR: RRR. LUNGS: Unlabored resp. MS: R hand- FROM with adequate grip but < L hand grip. Oppositional strength is normal. Tinel's +. Neurovascular intact. NEURO: A&O x 3; CNs intact. Speech is normal. Gait is normal.  A/P: CTS (carpal tunnel syndrome), right- Advised pt to wear splint during the day. Consider a less rigid type of wrist support for night-time use.  Trial of steroids. Symptoms could be due to worsening C-spine DDD. Uncertain if pt has scheduled follow-up w/ Dr. Lovell Sheehan.  Meds ordered this encounter  Medications  . DISCONTD: predniSONE (DELTASONE) 20 MG tablet    Sig: Take tablets as directed over next 2 weeks. Take all doses with food.    Dispense:  20 tablet    Refill:  0  . traMADol (ULTRAM) 50  MG tablet    Sig: Take 1 tablet (50 mg total) by mouth every 8 (eight) hours as needed.    Dispense:  40 tablet    Refill:  0   Note: There are abnormalities on MRI worth monitoring- diffuse cord atrophy w/ central gray matter hyperintensity; mild atrophy in the posterior fossa and bilateral vertebral artery flow voids. There is moderate foraminal stenosis on R at C4-C5 and cord is flattened w/ narrowing in AP diameter at C5-C6.

## 2013-03-12 NOTE — Telephone Encounter (Signed)
Patient wants to know if it safe to take Vitamin B6 with all of his other medications.  He has capel tunnel.   [phone]

## 2013-03-13 NOTE — Telephone Encounter (Signed)
He called back and was advised.

## 2013-03-13 NOTE — Telephone Encounter (Signed)
Yes. Called left message for him to call me back.

## 2013-03-20 ENCOUNTER — Telehealth: Payer: Self-pay

## 2013-03-20 NOTE — Telephone Encounter (Signed)
Pt states that  [employer] is no longer covering his alprazolam, pt is requesting a formulary exception so he can continue to be prescribed this medication. He states that they told him that we should call them or complete a form, pt does not have a form nor a phone number for [place] to call. Please advise.    Best# [phone]

## 2013-03-24 NOTE — Telephone Encounter (Signed)
PA form was faxed on 03/21/12 w/confirmation. Awaiting decision.

## 2013-03-24 NOTE — Telephone Encounter (Signed)
PA approved through 03/21/14. Pharm notified. Tried to call pt but no ans/no VM. Ins stated pt had been notified, but will try again later.

## 2013-03-27 ENCOUNTER — Other Ambulatory Visit: Payer: Self-pay

## 2013-03-27 NOTE — Telephone Encounter (Signed)
Pharm reqs RF of alprazolam. It is mail order so I have pended for 90 day RF for review.

## 2013-03-28 NOTE — Telephone Encounter (Signed)
Clorazepate 7.5 mg tablet prescribed to replace Alprazolam; pt stated that Alprazolam was not covered by insurer.

## 2013-03-30 ENCOUNTER — Other Ambulatory Visit: Payer: Self-pay

## 2013-03-30 NOTE — Telephone Encounter (Signed)
Pharm reqs RF of alprazolam. I have pended it as last written by Dr Leward Quan.

## 2013-03-30 NOTE — Telephone Encounter (Signed)
At 02/20/13 office visit, pt stated Alprazolam no longer covered by his insurer. He requested med change; that is when I prescribed Tranxene  (clorazepate) 7.5 mg 1 tab bid. I will leave pt on this medication until I hear from him that he wishes to change back to Alprazolam.

## 2013-04-04 ENCOUNTER — Ambulatory Visit (INDEPENDENT_AMBULATORY_CARE_PROVIDER_SITE_OTHER): Payer: Medicare Other | Admitting: Family Medicine

## 2013-04-04 ENCOUNTER — Encounter: Payer: Self-pay | Admitting: Family Medicine

## 2013-04-04 ENCOUNTER — Other Ambulatory Visit: Payer: Self-pay | Admitting: Radiology

## 2013-04-04 VITALS — BP 134/87 | HR 92 | Temp 97.7°F | Resp 16 | Ht 67.75 in | Wt 206.0 lb

## 2013-04-04 DIAGNOSIS — F419 Anxiety disorder, unspecified: Secondary | ICD-10-CM

## 2013-04-04 DIAGNOSIS — F411 Generalized anxiety disorder: Secondary | ICD-10-CM

## 2013-04-04 DIAGNOSIS — R209 Unspecified disturbances of skin sensation: Secondary | ICD-10-CM

## 2013-04-04 DIAGNOSIS — R202 Paresthesia of skin: Secondary | ICD-10-CM

## 2013-04-04 MED ORDER — TRAMADOL HCL 50 MG PO TABS: 50.0000 mg | ORAL_TABLET | Freq: Three times a day (TID) | ORAL | Status: DC | PRN

## 2013-04-04 MED ORDER — ALPRAZOLAM 0.5 MG PO TABS: ORAL_TABLET | ORAL | Status: DC

## 2013-04-04 NOTE — Progress Notes (Signed)
S:  This 68 y.o. 49 male returns today for R hand recheck. Tips of fingers (thumb, 1st and 2nd) are numb. 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 Wearing a splint has not improved condition. Prednisone taper taken in Dec 2014 was not effective. Tramadol is effective.  He reports difficulty holding a glass or turn a doorknob. He describes it as "an Advertising copywriter in fingertips". Sister has CST that required surgery.  Pt requests Alprazolam medication be prescribed instead of Clorazepate which was substituted at visit last fall. Pt has had ongoing difficulties w/ [facility] and plans to switch to local pharmacy this year.  PROBLEM LIST, PMHx, Soc and Fam Hx reviewed.  Medications reconciled.  ROS: As per HPI.  O: Filed Vitals:   04/04/13 1336  BP: 134/87  Pulse: 92  Temp: 97.7 F (36.5 C)  Resp: 16   GEN: In NAD; WN,WD. HENT: [place]/AT; EOMI w/ clear conj/ sclerae. Otherwise unremarkable. NECK: Decreased ROM. MS: MAEs; hands w/o deformities or muscle atrophy. NV intact. NEURO: A&O x 3; CNs intact. Pt anxious - this is his baseline.  A/P: Anxiety- Refill Alprazolam and will fax to [facility].  Discontinue Clorazepate.  Paresthesias in right hand- Continue conservative treatment w/ Tramadol.  Meds ordered this encounter  Medications  . DISCONTD: ALPRAZolam (XANAX) 0.5 MG tablet    Sig: Take 0.5 mg by mouth at bedtime as needed for anxiety.  . ALPRAZolam (XANAX) 0.5 MG tablet    Sig: Take 1 tablet bid as needed for anxiety.    Dispense:  45 tablet    Refill:  2    #45 tablets should last 30 days.  . traMADol (ULTRAM) 50 MG tablet    Sig: Take 1 tablet (50 mg total) by mouth every 8 (eight) hours as needed.    Dispense:  50 tablet    Refill:  1

## 2013-04-04 NOTE — Telephone Encounter (Signed)
Pharm called and stated that they got a req from pt for alprazolam RF. I WAS able to get a PA approved and pharm verified that it will be covered for another year. LMOM for pt to CB to clarify which medication he wants to take and if he has filled/tried the Tranxene.

## 2013-04-09 ENCOUNTER — Other Ambulatory Visit: Payer: Self-pay | Admitting: *Deleted

## 2013-04-09 DIAGNOSIS — E785 Hyperlipidemia, unspecified: Secondary | ICD-10-CM

## 2013-04-09 MED ORDER — FENOFIBRATE 145 MG PO TABS
145.0000 mg | ORAL_TABLET | Freq: Every day | ORAL | Status: DC
Start: 2013-04-09 — End: 2013-07-27

## 2013-04-10 ENCOUNTER — Telehealth: Payer: Self-pay

## 2013-04-10 DIAGNOSIS — R202 Paresthesia of skin: Secondary | ICD-10-CM

## 2013-04-10 NOTE — Telephone Encounter (Signed)
Dr Renee Harder advise.

## 2013-04-10 NOTE — Telephone Encounter (Signed)
Pt is now ready for dr Leward Quan to refer him to a hand specialist

## 2013-04-11 NOTE — Telephone Encounter (Signed)
Called patient to advise  °

## 2013-04-11 NOTE — Telephone Encounter (Signed)
I have placed a referral to Dr. Amedeo Plenty- Hand Specialist. Notify pt. Thanks.

## 2013-04-13 ENCOUNTER — Telehealth: Payer: Self-pay

## 2013-04-13 NOTE — Telephone Encounter (Signed)
PATIENT WOULD LIKE THIS MESSAGE TO GO TO DR. Leward Quan: ALL OF HIS MEDICATIONS NOW GO TO [facility] [address]. HE NO LONGER USES [facility]. HE JUST WANTS THIS TO BE ON FILE FOR FUTURE REFERENCE. BEST PHONE [phone] (HOME)   [place]

## 2013-04-13 NOTE — Telephone Encounter (Signed)
I have [facility] [address]. Thanks.

## 2013-04-28 ENCOUNTER — Encounter: Payer: Self-pay | Admitting: Family Medicine

## 2013-04-28 ENCOUNTER — Ambulatory Visit (INDEPENDENT_AMBULATORY_CARE_PROVIDER_SITE_OTHER): Payer: Medicare Other | Admitting: Family Medicine

## 2013-04-28 VITALS — BP 116/68 | HR 86 | Temp 97.7°F | Resp 16 | Ht 67.75 in | Wt 205.2 lb

## 2013-04-28 DIAGNOSIS — G5600 Carpal tunnel syndrome, unspecified upper limb: Secondary | ICD-10-CM

## 2013-04-28 DIAGNOSIS — E119 Type 2 diabetes mellitus without complications: Secondary | ICD-10-CM | POA: Insufficient documentation

## 2013-04-28 DIAGNOSIS — I10 Essential (primary) hypertension: Secondary | ICD-10-CM

## 2013-04-28 DIAGNOSIS — R5381 Other malaise: Secondary | ICD-10-CM

## 2013-04-28 DIAGNOSIS — R5383 Other fatigue: Secondary | ICD-10-CM

## 2013-04-28 LAB — POCT GLYCOSYLATED HEMOGLOBIN (HGB A1C): Hemoglobin A1C: 7.1

## 2013-04-28 LAB — GLUCOSE, POCT (MANUAL RESULT ENTRY): POC Glucose: 98 mg/dl (ref 70–99)

## 2013-04-28 MED ORDER — METFORMIN HCL 500 MG PO TABS: ORAL_TABLET | ORAL | Status: DC

## 2013-04-28 NOTE — Patient Instructions (Signed)
Exercise to Lose Weight Exercise and a healthy diet may help you lose weight. Your doctor may suggest specific exercises. EXERCISE IDEAS AND TIPS  Choose low-cost things you enjoy doing, such as walking, bicycling, or exercising to workout videos.  Take stairs instead of the elevator.  Walk during your lunch break.  Park your car further away from work or school.  Go to a gym or an exercise class.  Start with 5 to 10 minutes of exercise each day. Build up to 30 minutes of exercise 4 to 6 days a week.  Wear shoes with good support and comfortable clothes.  Stretch before and after working out.  Work out until you breathe harder and your heart beats faster.  Drink extra water when you exercise.  Do not do so much that you hurt yourself, feel dizzy, or get very short of breath. Exercises that burn about 150 calories:  Running 1  miles in 15 minutes.  Playing volleyball for 45 to 60 minutes.  Washing and waxing a car for 45 to 60 minutes.  Playing touch football for 45 minutes.  Walking 1  miles in 35 minutes.  Pushing a stroller 1  miles in 30 minutes.  Playing basketball for 30 minutes.  Raking leaves for 30 minutes.  Bicycling 5 miles in 30 minutes.  Walking 2 miles in 30 minutes.  Dancing for 30 minutes.  Shoveling snow for 15 minutes.  Swimming laps for 20 minutes.  Walking up stairs for 15 minutes.  Bicycling 4 miles in 15 minutes.  Gardening for 30 to 45 minutes.  Jumping rope for 15 minutes.  Washing windows or floors for 45 to 60 minutes. Document Released: 04/04/2010 Document Revised: 05/25/2011 Document Reviewed: 04/04/2010 [facility] Patient Information 2014 [place], [place].   You are not getting enough sleep; 4-5 hours is not adequate. Try MELATONIN 3 mg 1 tablet before you want to go to sleep.  You need to try to get some regular physical activity; this will help you rest better. Try using some light hand weights to work the muscles in  your arms.

## 2013-05-02 NOTE — Progress Notes (Signed)
Subjective:    Patient ID: Martin [name], male    DOB: Aug 21, 1945, 68 y.o.   MRN: [id number]  Hypertension Associated symptoms include neck pain. Pertinent negatives include no chest pain, palpitations or shortness of breath.  Hyperlipidemia Pertinent negatives include no chest pain or shortness of breath.   This 68 y.o. 75 male returns for follow-up of HTN and impaired glucose tolerance.  He is compliant w/ all meds but continues to experience fatigue. He continues to have sleep issues due to chronic anxiety. He has a good appetite but denies polyphagia, polydipsia and polyuria. He knows that he should try to be more active during the day but lacks the motivation.  Pt has chronic neck discomfort due to C-spine DDD. He has persistent R hand paresthesias; his referral to [facility]- is pending. He is able to use both hands but finds some weakness in grasp and w/ fine motor movements.  Patient Active Problem List   Diagnosis Date Noted  . Type II or unspecified type diabetes mellitus without mention of complication, not stated as uncontrolled 04/28/2013  . Acute kidney injury 07/08/2012  . Caregiver stress 04/24/2012  . Metabolic syndrome 40/81/4481  . Dyspnea 12/24/2011  . Myofascial pain dysfunction syndrome 07/24/2011  . DDD (degenerative disc disease), cervical 06/24/2011  . Preventative health care 11/21/2010  . Anxiety 11/21/2010  . Depression 11/21/2010  . TRANSAMINASES, SERUM, ELEVATED 04/03/2010  . Allergic rhinitis due to other allergen 07/18/2008  . HYPERLIPIDEMIA 01/11/2008  . HYPERTENSION, BENIGN ESSENTIAL 01/11/2008  . HYPERTROPHY PROSTATE W/UR OBST & OTH LUTS 01/11/2008   Prior to Admission medications   Medication Sig Start Date End Date Taking? Authorizing Provider  ALPRAZolam Duanne Moron) 0.5 MG tablet Take 1 tablet bid as needed for anxiety. 04/04/13  Yes Barton Fanny, MD  carvedilol (COREG) 6.25 MG tablet Take 0.5 tablets (3.125 mg total) by mouth  2 (two) times daily with a meal. 01/26/13  Yes Barton Fanny, MD  fenofibrate (TRICOR) 145 MG tablet Take 1 tablet (145 mg total) by mouth daily. 04/09/13 04/09/14 Yes [place], PA-C  latanoprost (XALATAN) 0.005 % ophthalmic solution Place 1 drop into both eyes at bedtime.   Yes Historical Provider, MD  loratadine (CLARITIN) 10 MG tablet Take 10 mg by mouth daily.   Yes Historical Provider, MD  losartan-hydrochlorothiazide (HYZAAR) 100-25 MG per tablet Take 1 tablet by mouth daily. 12/16/12  Yes Barton Fanny, MD  omega-3 acid ethyl esters (LOVAZA) 1 G capsule TAKE 2 CAPSULES BY MOUTH TWICE A DAY 02/02/13  Yes Barton Fanny, MD  tamsulosin (FLOMAX) 0.4 MG CAPS capsule TAKE ONE CAPSULE BY MOUTH EVERY DAY 02/27/13  Yes Barton Fanny, MD  traMADol (ULTRAM) 50 MG tablet Take 1 tablet (50 mg total) by mouth every 8 (eight) hours as needed. 04/04/13  Yes Barton Fanny, MD  metFORMIN (GLUCOPHAGE) 500 MG tablet Take 1 tablet once a day with largest meal. 04/28/13   Barton Fanny, MD    Review of Systems  Constitutional: Negative for fever, chills, diaphoresis, activity change, appetite change, fatigue and unexpected weight change.  HENT: Negative.   Eyes: Negative for visual disturbance.  Respiratory: Negative for chest tightness and shortness of breath.   Cardiovascular: Negative for chest pain and palpitations.  Gastrointestinal: Negative.   Musculoskeletal: Positive for neck pain.  Skin: Negative.   Neurological: Negative.   Psychiatric/Behavioral: Positive for sleep disturbance.       Objective:   Physical Exam  Nursing note and vitals reviewed. Constitutional: He is oriented to person, place, and time. He appears well-developed and well-nourished. No distress.  HENT:  Head: Normocephalic and atraumatic.  Right Ear: External ear normal.  Left Ear: External ear normal.  Nose: Nose normal.  Eyes: Conjunctivae and EOM are normal. Pupils are equal,  round, and reactive to light. No scleral icterus.  Neck: Normal range of motion. Neck supple. No thyromegaly present.  Cardiovascular: Normal rate and regular rhythm.   Pulmonary/Chest: Effort normal. No respiratory distress.  Musculoskeletal: Normal range of motion. He exhibits no edema and no tenderness.  R hand appears normal w/o muscle atrophy. Grip is good.  Lymphadenopathy:    He has no cervical adenopathy.  Neurological: He is alert and oriented to person, place, and time. He has normal strength. He displays no atrophy. No cranial nerve deficit or sensory deficit. He exhibits normal muscle tone. Coordination and gait normal.  Skin: Skin is warm and dry.  Psychiatric: Judgment and thought content normal. His mood appears anxious. His affect is not labile and not inappropriate. His speech is delayed. He is not agitated and not aggressive. Cognition and memory are normal. He exhibits a depressed mood.    Results for orders placed in visit on 04/28/13  POCT GLYCOSYLATED HEMOGLOBIN (HGB A1C)      Result Value Ref Range   Hemoglobin A1C 7.1    GLUCOSE, POCT (MANUAL RESULT ENTRY)      Result Value Ref Range   POC Glucose 98  70 - 99 mg/dl      Assessment & Plan:  Type II or unspecified type diabetes mellitus without mention of complication, not stated as uncontrolled - A1c has gradually increased;  Will initiate medication- Metformin 500 mg 1 tablet daily with largest meal.Address unhealthy eating habits and increase physical activity.   Plan: POCT glycosylated hemoglobin (Hb A1C), POCT glucose (manual entry)  HYPERTENSION, BENIGN ESSENTIAL- Stable on current medications. Encouraged weight reduction.  Other malaise and fatigue - Due to sleep disturbance and impaired glucose tolerance. Plan: POCT glycosylated hemoglobin (Hb A1C), POCT glucose (manual entry)  CTS (carpal tunnel syndrome)- Pt advised that referral authorized but, due to limited number of Hand Spec, appt date may be a  several weeks in the future.

## 2013-05-10 ENCOUNTER — Other Ambulatory Visit: Payer: Self-pay | Admitting: Family Medicine

## 2013-05-10 NOTE — Telephone Encounter (Signed)
Alprazolam refill was phoned to [facility] w/ message indicating that pt should have received his mail order supply of this medication that was prescribed on 04/04/13. For that reason, I prescribed a smaller amount; if pt has questions about this refill, he will be instructed to contact the clinic for clarification.

## 2013-06-08 ENCOUNTER — Other Ambulatory Visit: Payer: Self-pay | Admitting: Family Medicine

## 2013-06-10 NOTE — Telephone Encounter (Signed)
Alprazolam refill phoned to pt's pharmacy. 

## 2013-07-11 ENCOUNTER — Other Ambulatory Visit: Payer: Self-pay | Admitting: Family Medicine

## 2013-07-12 NOTE — Telephone Encounter (Signed)
Alprazolam refill phoned to pt's pharmacy. 

## 2013-07-13 ENCOUNTER — Other Ambulatory Visit: Payer: Self-pay

## 2013-07-13 NOTE — Telephone Encounter (Signed)
Dr Leward Quan, you saw pt in Feb, but not for this med. Is it OK to give RF?

## 2013-07-14 MED ORDER — TAMSULOSIN HCL 0.4 MG PO CAPS: ORAL_CAPSULE | ORAL | Status: DC

## 2013-07-14 NOTE — Telephone Encounter (Signed)
Flomax refills authorized. Pt has appt next week.

## 2013-07-19 ENCOUNTER — Encounter: Payer: Self-pay | Admitting: Family Medicine

## 2013-07-19 DIAGNOSIS — Z9289 Personal history of other medical treatment: Secondary | ICD-10-CM | POA: Insufficient documentation

## 2013-07-27 ENCOUNTER — Encounter: Payer: Self-pay | Admitting: Family Medicine

## 2013-07-27 ENCOUNTER — Ambulatory Visit (INDEPENDENT_AMBULATORY_CARE_PROVIDER_SITE_OTHER): Payer: Medicare Other | Admitting: Family Medicine

## 2013-07-27 VITALS — BP 110/64 | HR 88 | Temp 97.9°F | Resp 16 | Ht 67.5 in | Wt 208.2 lb

## 2013-07-27 DIAGNOSIS — F419 Anxiety disorder, unspecified: Secondary | ICD-10-CM

## 2013-07-27 DIAGNOSIS — Z6379 Other stressful life events affecting family and household: Secondary | ICD-10-CM

## 2013-07-27 DIAGNOSIS — E119 Type 2 diabetes mellitus without complications: Secondary | ICD-10-CM

## 2013-07-27 DIAGNOSIS — Z636 Dependent relative needing care at home: Secondary | ICD-10-CM

## 2013-07-27 DIAGNOSIS — Z760 Encounter for issue of repeat prescription: Secondary | ICD-10-CM

## 2013-07-27 DIAGNOSIS — F411 Generalized anxiety disorder: Secondary | ICD-10-CM

## 2013-07-27 DIAGNOSIS — F5105 Insomnia due to other mental disorder: Secondary | ICD-10-CM

## 2013-07-27 DIAGNOSIS — I10 Essential (primary) hypertension: Secondary | ICD-10-CM

## 2013-07-27 DIAGNOSIS — F489 Nonpsychotic mental disorder, unspecified: Secondary | ICD-10-CM

## 2013-07-27 LAB — POCT GLYCOSYLATED HEMOGLOBIN (HGB A1C): HEMOGLOBIN A1C: 7

## 2013-07-27 MED ORDER — LOSARTAN POTASSIUM-HCTZ 100-25 MG PO TABS: 1.0000 | ORAL_TABLET | Freq: Every day | ORAL | Status: DC

## 2013-07-27 MED ORDER — FENOFIBRATE 145 MG PO TABS: 145.0000 mg | ORAL_TABLET | Freq: Every day | ORAL | Status: DC

## 2013-07-27 MED ORDER — CARVEDILOL 6.25 MG PO TABS: 3.1250 mg | ORAL_TABLET | Freq: Two times a day (BID) | ORAL | Status: DC

## 2013-07-27 NOTE — Progress Notes (Signed)
S:  This 68 y.o. Cauc male is here for follow-up; DM diagnosed in Feb 2015, requiring oral medication. Pt is tolerating Metformin well but has not increased physical activity; nutrition is somewhat improved.  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 Pt has chronic C-spine DDD and R hand pain and weakness; he is scheduled to see hand specialist and will need to a surgical procedure. He has discussed this w/ a family member who will try to help him w/ his mother during the recuperative period.   Pt continues to struggle w/ sleep hygiene. He comments that his mind "won't quiet down" so that he can sleep. He denies nightmares or apnea.  HTN is well controlled and stable on current medication; Pt denies adverse effects; he has no CP or tightness, palpitations, SOB, edema, dizziness, facial asymmetry, numbness or weakness or syncope. He has occasional HA related to cervical spine DDD.  Patient Active Problem List   Diagnosis Date Noted  . Hx of complete eye exam 07/19/2013  . Type II or unspecified type diabetes mellitus without mention of complication, not stated as uncontrolled 04/28/2013  . Acute kidney injury 07/08/2012  . Caregiver stress 04/24/2012  . Metabolic syndrome 29/93/7169  . Dyspnea 12/24/2011  . Myofascial pain dysfunction syndrome 07/24/2011  . DDD (degenerative disc disease), cervical 06/24/2011  . Preventative health care 11/21/2010  . Anxiety 11/21/2010  . Depression 11/21/2010  . TRANSAMINASES, SERUM, ELEVATED 04/03/2010  . Allergic rhinitis due to other allergen 07/18/2008  . HYPERLIPIDEMIA 01/11/2008  . HYPERTENSION, BENIGN ESSENTIAL 01/11/2008  . HYPERTROPHY PROSTATE W/UR OBST & OTH LUTS 01/11/2008   Prior to Admission medications   Medication Sig Start Date End Date Taking? Authorizing Provider  ALPRAZolam (XANAX) 0.5 MG tablet TAKE 1 TABLET BY MOUTH TWICE A DAY AS NEEDED   Yes Barton Fanny, MD  carvedilol (COREG) 6.25 MG  tablet Take 0.5 tablets (3.125 mg total) by mouth 2 (two) times daily with a meal.   Yes Barton Fanny, MD  fenofibrate (TRICOR) 145 MG tablet Take 1 tablet (145 mg total) by mouth daily.   Yes Barton Fanny, MD  latanoprost (XALATAN) 0.005 % ophthalmic solution Place 1 drop into both eyes at bedtime.   Yes Historical Provider, MD  loratadine (CLARITIN) 10 MG tablet Take 10 mg by mouth daily.   Yes Historical Provider, MD  losartan-hydrochlorothiazide (HYZAAR) 100-25 MG per tablet Take 1 tablet by mouth daily.   Yes Barton Fanny, MD  metFORMIN (GLUCOPHAGE) 500 MG tablet Take 1 tablet once a day with largest meal. 04/28/13  Yes Barton Fanny, MD  omega-3 acid ethyl esters (LOVAZA) 1 G capsule TAKE 2 CAPSULES BY MOUTH TWICE A DAY 02/02/13  Yes Barton Fanny, MD  tamsulosin (FLOMAX) 0.4 MG CAPS capsule TAKE ONE CAPSULE BY MOUTH EVERY DAY   Yes Barton Fanny, MD   PMHx, Surg Hx, Soc and Fam Hx reviewed.  ROS: As per HPI.  O: Filed Vitals:   07/27/13 1402  BP: 110/64  Pulse: 88  Temp: 97.9 F (36.6 C)  Resp: 16   GEN: In NAD: WN,WD. HENT: [place]/AT; EOMI w/ clear conj/sclerae. Otherwise unremarkable. COR: RRR. LUNGS: Unlabored resp. SKIN: W&D; intact w/o diaphoresis, erythema or pallor. MS: MAEs; R hand w/ palmar contracture and thickening. See DM FOOT EXAM. NEURO: A&O x 3; CNs intact.  PSYCH: Anxious with mild concentration difficulty and word-finding difficulties.  Results for orders placed in visit  on 07/27/13  POCT GLYCOSYLATED HEMOGLOBIN (HGB A1C)      Result Value Ref Range   Hemoglobin A1C 7.0      A/P: Type II or unspecified type diabetes mellitus without mention of complication, not stated as uncontrolled - Continue Metformin 500 mg 1 tablet daily w/ largest meal. Plan: HM Diabetes Foot Exam, POCT glycosylated hemoglobin (Hb A1C)  Insomnia secondary to anxiety  Caregiver stress  HYPERTENSION, BENIGN ESSENTIAL- Stable and controlled;  continue current medications.  Issue of repeat prescriptions - Plan: fenofibrate (TRICOR) 145 MG tablet  Meds ordered this encounter  Medications  . carvedilol (COREG) 6.25 MG tablet    Sig: Take 0.5 tablets (3.125 mg total) by mouth 2 (two) times daily with a meal.    Dispense:  30 tablet    Refill:  5  . losartan-hydrochlorothiazide (HYZAAR) 100-25 MG per tablet    Sig: Take 1 tablet by mouth daily.    Dispense:  30 tablet    Refill:  5  . fenofibrate (TRICOR) 145 MG tablet    Sig: Take 1 tablet (145 mg total) by mouth daily.    Dispense:  30 tablet    Refill:  5

## 2013-07-27 NOTE — Patient Instructions (Signed)
Your Diabetes is under good control. Focus on healthy eating and trying to stay active. Try to reduce your stress as this contributes to weight gain and poor sleep.  I will see you again in 3-4 months.

## 2013-08-18 ENCOUNTER — Other Ambulatory Visit: Payer: Self-pay | Admitting: Family Medicine

## 2013-08-18 NOTE — Telephone Encounter (Signed)
Alprazolam phoned to pt's pharmacy.

## 2013-11-02 ENCOUNTER — Other Ambulatory Visit: Payer: Self-pay | Admitting: Family Medicine

## 2013-11-04 NOTE — Telephone Encounter (Signed)
Alprazolam refill phoned to pt's pharmacy. 

## 2013-11-11 ENCOUNTER — Other Ambulatory Visit: Payer: Self-pay | Admitting: Family Medicine

## 2013-11-30 ENCOUNTER — Encounter: Payer: Self-pay | Admitting: Family Medicine

## 2013-11-30 ENCOUNTER — Ambulatory Visit (INDEPENDENT_AMBULATORY_CARE_PROVIDER_SITE_OTHER): Payer: Medicare Other | Admitting: Family Medicine

## 2013-11-30 VITALS — BP 108/70 | HR 94 | Temp 97.4°F | Resp 16 | Ht 67.5 in | Wt 206.0 lb

## 2013-11-30 DIAGNOSIS — I10 Essential (primary) hypertension: Secondary | ICD-10-CM

## 2013-11-30 DIAGNOSIS — E119 Type 2 diabetes mellitus without complications: Secondary | ICD-10-CM

## 2013-11-30 DIAGNOSIS — J3089 Other allergic rhinitis: Secondary | ICD-10-CM

## 2013-11-30 LAB — POCT GLYCOSYLATED HEMOGLOBIN (HGB A1C): HEMOGLOBIN A1C: 7.1

## 2013-11-30 MED ORDER — MOMETASONE FUROATE 50 MCG/ACT NA SUSP: 2.0000 | Freq: Every day | NASAL | Status: DC

## 2013-11-30 MED ORDER — LEVOCETIRIZINE DIHYDROCHLORIDE 5 MG PO TABS: 5.0000 mg | ORAL_TABLET | Freq: Every evening | ORAL | Status: DC

## 2013-11-30 NOTE — Patient Instructions (Signed)
Allergic Rhinitis Allergic rhinitis is when the mucous membranes in the nose respond to allergens. Allergens are particles in the air that cause your body to have an allergic reaction. This causes you to release allergic antibodies. Through a chain of events, these eventually cause you to release histamine into the blood stream. Although meant to protect the body, it is this release of histamine that causes your discomfort, such as frequent sneezing, congestion, and an itchy, runny nose.  CAUSES  Seasonal allergic rhinitis (hay fever) is caused by pollen allergens that may come from grasses, trees, and weeds. Year-round allergic rhinitis (perennial allergic rhinitis) is caused by allergens such as house dust mites, pet dander, and mold spores.  SYMPTOMS   Nasal stuffiness (congestion).  Itchy, runny nose with sneezing and tearing of the eyes. DIAGNOSIS  Your health care provider can help you determine the allergen or allergens that trigger your symptoms. If you and your health care provider are unable to determine the allergen, skin or blood testing may be used. TREATMENT  Allergic rhinitis does not have a cure, but it can be controlled by:  Medicines and allergy shots (immunotherapy).  Avoiding the allergen. Hay fever may often be treated with antihistamines in pill or nasal spray forms. Antihistamines block the effects of histamine. There are over-the-counter medicines that may help with nasal congestion and swelling around the eyes. Check with your health care provider before taking or giving this medicine.  If avoiding the allergen or the medicine prescribed do not work, there are many new medicines your health care provider can prescribe. Stronger medicine may be used if initial measures are ineffective. Desensitizing injections can be used if medicine and avoidance does not work. Desensitization is when a patient is given ongoing shots until the body becomes less sensitive to the allergen.  Make sure you follow up with your health care provider if problems continue. HOME CARE INSTRUCTIONS It is not possible to completely avoid allergens, but you can reduce your symptoms by taking steps to limit your exposure to them. It helps to know exactly what you are allergic to so that you can avoid your specific triggers. SEEK MEDICAL CARE IF:   You have a fever.  You develop a cough that does not stop easily (persistent).  You have shortness of breath.  You start wheezing.  Symptoms interfere with normal daily activities. Document Released: 11/25/2000 Document Revised: 03/07/2013 Document Reviewed: 11/07/2012 [facility] Patient Information 2015 [place], [place]. This information is not intended to replace advice given to you by your health care provider. Make sure you discuss any questions you have with your health care provider.         Mediterranean Diet  Why follow it? Research shows.   Those who follow the Mediterranean diet have a reduced risk of heart disease    The diet is associated with a reduced incidence of Parkinson's and Alzheimer's diseases   People following the diet may have longer life expectancies and lower rates of chronic diseases    The Dietary Guidelines for Americans recommends the Mediterranean diet as an eating plan to promote health and prevent disease  What Is the Mediterranean Diet?    Healthy eating plan based on typical foods and recipes of Mediterranean-style cooking   The diet is primarily a plant based diet; these foods should make up a majority of meals   Starches - Plant based foods should make up a majority of meals - They are an important sources of vitamins, minerals,  energy, antioxidants, and fiber - Choose whole grains, foods high in fiber and minimally processed items  - Typical grain sources include wheat, oats, barley, corn, brown rice, bulgar, farro, millet, polenta, couscous  - Various types of beans include chickpeas, lentils, fava  beans, black beans, white beans   Fruits  Veggies - Large quantities of antioxidant rich fruits & veggies; 6 or more servings  - Vegetables can be eaten raw or lightly drizzled with oil and cooked  - Vegetables common to the traditional Mediterranean Diet include: artichokes, arugula, beets, broccoli, brussel sprouts, cabbage, carrots, celery, collard greens, cucumbers, eggplant, kale, leeks, lemons, lettuce, mushrooms, okra, onions, peas, peppers, potatoes, pumpkin, radishes, rutabaga, shallots, spinach, sweet potatoes, turnips, zucchini - Fruits common to the Mediterranean Diet include: apples, apricots, avocados, cherries, clementines, dates, figs, grapefruits, grapes, melons, nectarines, oranges, peaches, pears, pomegranates, strawberries, tangerines  Fats - Replace butter and margarine with healthy oils, such as olive oil, canola oil, and tahini  - Limit nuts to no more than a handful a day  - Nuts include walnuts, almonds, pecans, pistachios, pine nuts  - Limit or avoid candied, honey roasted or heavily salted nuts - Olives are central to the [employer] - can be eaten whole or used in a variety of dishes   Meats Protein - Limiting red meat: no more than a few times a month - When eating red meat: choose lean cuts and keep the portion to the size of deck of cards - Eggs: approx. 0 to 4 times a week  - Fish and lean poultry: at least 2 a week  - Healthy protein sources include, chicken, [place], lean beef, lamb - Increase intake of seafood such as tuna, salmon, trout, mackerel, shrimp, scallops - Avoid or limit high fat processed meats such as sausage and bacon  Dairy - Include moderate amounts of low fat dairy products  - Focus on healthy dairy such as fat free yogurt, skim milk, low or reduced fat cheese - Limit dairy products higher in fat such as whole or 2% milk, cheese, ice cream  Alcohol - Moderate amounts of red wine is ok  - No more than 5 oz daily for women (all ages) and  men older than age 65  - No more than 10 oz of wine daily for men younger than 72  Other - Limit sweets and other desserts  - Use herbs and spices instead of salt to flavor foods  - Herbs and spices common to the traditional Mediterranean Diet include: basil, bay leaves, chives, cloves, cumin, fennel, garlic, lavender, marjoram, mint, oregano, parsley, pepper, rosemary, sage, savory, sumac, tarragon, thyme   It's not just a diet, it's a lifestyle:    The Mediterranean diet includes lifestyle factors typical of those in the region    Foods, drinks and meals are best eaten with others and savored   Daily physical activity is important for overall good health   This could be strenuous exercise like running and aerobics   This could also be more leisurely activities such as walking, housework, yard-work, or taking the stairs   Moderation is the key; a balanced and healthy diet accommodates most foods and drinks   Consider portion sizes and frequency of consumption of certain foods   Meal Ideas & Options:    Breakfast:  o Whole wheat toast or whole wheat English muffins with peanut butter & hard boiled egg o Steel cut oats topped with apples & cinnamon and skim milk  o Fresh fruit: banana, strawberries, melon, berries, peaches  o Smoothies: strawberries, bananas, greek yogurt, peanut butter o Low fat greek yogurt with blueberries and granola  o Egg white omelet with spinach and mushrooms o Breakfast couscous: whole wheat couscous, apricots, skim milk, cranberries    Sandwiches:  o Hummus and grilled vegetables (peppers, zucchini, squash) on whole wheat bread   o Grilled chicken on whole wheat pita with lettuce, tomatoes, cucumbers or tzatziki  o Tuna salad on whole wheat bread: tuna salad made with greek yogurt, olives, red peppers, capers, green onions o Garlic rosemary lamb pita: lamb sauted with garlic, rosemary, salt & pepper; add lettuce, cucumber, greek yogurt to pita - flavor with  lemon juice and black pepper    Seafood:  o Mediterranean grilled salmon, seasoned with garlic, basil, parsley, lemon juice and black pepper o Shrimp, lemon, and spinach whole-grain pasta salad made with low fat greek yogurt  o Seared scallops with lemon orzo  o Seared tuna steaks seasoned salt, pepper, coriander topped with tomato mixture of olives, tomatoes, olive oil, minced garlic, parsley, green onions and cappers    Meats:  o Herbed greek chicken salad with kalamata olives, cucumber, feta  o Red bell peppers stuffed with spinach, bulgur, lean ground beef (or lentils) & topped with feta   o Kebabs: skewers of chicken, tomatoes, onions, zucchini, squash  o [place] burgers: made with red onions, mint, dill, lemon juice, feta cheese topped with roasted red peppers   Vegetarian o Cucumber salad: cucumbers, artichoke hearts, celery, red onion, feta cheese, tossed in olive oil & lemon juice  o Hummus and whole grain pita points with a greek salad (lettuce, tomato, feta, olives, cucumbers, red onion) o Lentil soup with celery, carrots made with vegetable broth, garlic, salt and pepper  o Tabouli salad: parsley, bulgur, mint, scallions, cucumbers, tomato, radishes, lemon juice, olive oil, salt and pepper. o

## 2013-12-01 NOTE — Progress Notes (Signed)
Subjective:    Patient ID: Martin [name], male    DOB: 07/04/1945, 68 y.o.   MRN: [id number]  Cough Associated symptoms include postnasal drip and a sore throat. Pertinent negatives include no ear pain, eye redness, headaches, rhinorrhea, shortness of breath or wheezing.     This 68 y.o. Cauc male presents w/ nasal and sinus congestion for 3-4 days. He has seasonal allergies, treated w/ [facility] brand allergy medication and saline nasal spray.  It is minimally effective. Steroid nasal spray in the past resulted in nosebleed.  He also has NP cough and sore throat. He denies fever/chills, SOB or exposure to illness.  Pt has Type II DM and is compliant w/ Metformin w/o adverse medication reaction. He does not have any documented FSBS. He denies hypoglycemic symptoms. Self- management is good; meal planning and exercise has improved.  HTN- medication compliance is excellent. No report of fatigue, vision disturbances, CP or tightness, palpitations, SOB or DOE, edema, dizziness, numbness, weakness or syncope.   Patient Active Problem List   Diagnosis Date Noted  . Hx of complete eye exam 07/19/2013  . Type II or unspecified type diabetes mellitus without mention of complication, not stated as uncontrolled 04/28/2013  . Acute kidney injury 07/08/2012  . Caregiver stress 04/24/2012  . Metabolic syndrome 78/67/6720  . Dyspnea 12/24/2011  . Myofascial pain dysfunction syndrome 07/24/2011  . DDD (degenerative disc disease), cervical 06/24/2011  . Preventative health care 11/21/2010  . Anxiety 11/21/2010  . Depression 11/21/2010  . TRANSAMINASES, SERUM, ELEVATED 04/03/2010  . Allergic rhinitis due to other allergen 07/18/2008  . HYPERLIPIDEMIA 01/11/2008  . HYPERTENSION, BENIGN ESSENTIAL 01/11/2008  . HYPERTROPHY PROSTATE W/UR OBST & OTH LUTS 01/11/2008    Prior to Admission medications   Medication Sig Start Date End Date Taking? Authorizing Provider  ALPRAZolam Duanne Moron) 0.5 MG tablet TAKE 1  TABLET BY MOUTH TWICE A DAY AS NEEDED 11/04/13  Yes Barton Fanny, MD  carvedilol (COREG) 6.25 MG tablet Take 0.5 tablets (3.125 mg total) by mouth 2 (two) times daily with a meal. 07/27/13  Yes Barton Fanny, MD  fenofibrate (TRICOR) 145 MG tablet Take 1 tablet (145 mg total) by mouth daily. 07/27/13 07/27/14 Yes Barton Fanny, MD  latanoprost (XALATAN) 0.005 % ophthalmic solution Place 1 drop into both eyes at bedtime.   Yes Historical Provider, MD  losartan-hydrochlorothiazide (HYZAAR) 100-25 MG per tablet Take 1 tablet by mouth daily. 07/27/13  Yes Barton Fanny, MD  metFORMIN (GLUCOPHAGE) 500 MG tablet TAKE 1 TABLET ONCE A DAY WITH LARGEST MEAL. 11/12/13  Yes Barton Fanny, MD  omega-3 acid ethyl esters (LOVAZA) 1 G capsule TAKE 2 CAPSULES BY MOUTH TWICE A DAY 02/02/13  Yes Barton Fanny, MD  tamsulosin (FLOMAX) 0.4 MG CAPS capsule TAKE ONE CAPSULE BY MOUTH EVERY DAY   Yes Barton Fanny, MD    History   Social History  . Marital Status: Single    Spouse Name: N/A    Number of Children: N/A  . Years of Education: N/A   Occupational History  . retired    Social History Main Topics  . Smoking status: Never Smoker   . Smokeless tobacco: Never Used  . Alcohol Use: No  . Drug Use: No  . Sexual Activity: Not Currently   Other Topics Concern  . Not on file   Social History Narrative   No regular exercise    Review of Systems  HENT: Positive for congestion, postnasal  drip, sinus pressure and sore throat. Negative for ear pain, rhinorrhea and trouble swallowing.   Eyes: Negative for redness, itching and visual disturbance.  Respiratory: Positive for cough. Negative for chest tightness, shortness of breath and wheezing.   Cardiovascular: Negative.   Musculoskeletal: Negative.   Skin: Negative.   Neurological: Negative for dizziness, syncope, weakness, light-headedness, numbness and headaches.  Psychiatric/Behavioral: The patient is  nervous/anxious.       Objective:   Physical Exam  Nursing note and vitals reviewed. Constitutional: He is oriented to person, place, and time. Vital signs are normal. He appears well-developed and well-nourished. No distress.  HENT:  Head: Normocephalic and atraumatic.  Right Ear: Tympanic membrane, external ear and ear canal normal.  Left Ear: Tympanic membrane, external ear and ear canal normal.  Nose: Mucosal edema present. No rhinorrhea, nasal deformity or septal deviation. Right sinus exhibits no maxillary sinus tenderness and no frontal sinus tenderness. Left sinus exhibits no maxillary sinus tenderness and no frontal sinus tenderness.  Mouth/Throat: Uvula is midline and mucous membranes are normal. No oral lesions. No uvula swelling. Posterior oropharyngeal erythema present. No oropharyngeal exudate.  Eyes: Conjunctivae, EOM and lids are normal. Pupils are equal, round, and reactive to light. No scleral icterus.  Cardiovascular: Normal rate and regular rhythm.   Pulmonary/Chest: Effort normal and breath sounds normal. No respiratory distress.  Musculoskeletal: Normal range of motion.  Neurological: He is alert and oriented to person, place, and time. No cranial nerve deficit.  Skin: Skin is warm and dry. No rash noted. He is not diaphoretic. No erythema. No pallor.  Psychiatric: His mood appears anxious. His speech is delayed. He is slowed. He is inattentive.    Results for orders placed in visit on 11/30/13  POCT GLYCOSYLATED HEMOGLOBIN (HGB A1C)      Result Value Ref Range   Hemoglobin A1C 7.1         Assessment & Plan:  Allergic rhinitis due to other allergen- Trial of Xyzal generic and steroid nasal spray.  Type II or unspecified type diabetes mellitus without mention of complication, not stated as uncontrolled - Continue current medication and self- management. Encouraged continued weight loss.     Plan: POCT glycosylated hemoglobin (Hb A1C)  HYPERTENSION, BENIGN  ESSENTIAL- Stable on current medication.   Meds ordered this encounter  Medications  . levocetirizine (XYZAL) 5 MG tablet    Sig: Take 1 tablet (5 mg total) by mouth every evening.    Dispense:  30 tablet    Refill:  11  . mometasone (NASONEX) 50 MCG/ACT nasal spray    Sig: Place 2 sprays into the nose daily.    Dispense:  17 g    Refill:  5

## 2013-12-09 ENCOUNTER — Other Ambulatory Visit: Payer: Self-pay | Admitting: Family Medicine

## 2014-01-06 ENCOUNTER — Other Ambulatory Visit: Payer: Self-pay | Admitting: Family Medicine

## 2014-01-08 NOTE — Telephone Encounter (Signed)
Alprazolam refill phoned to pt's pharmacy. 

## 2014-02-12 ENCOUNTER — Other Ambulatory Visit: Payer: Self-pay | Admitting: Family Medicine

## 2014-02-17 ENCOUNTER — Other Ambulatory Visit: Payer: Self-pay | Admitting: Family Medicine

## 2014-03-03 ENCOUNTER — Other Ambulatory Visit: Payer: Self-pay | Admitting: Family Medicine

## 2014-03-11 ENCOUNTER — Other Ambulatory Visit: Payer: Self-pay | Admitting: Physician Assistant

## 2014-03-18 ENCOUNTER — Other Ambulatory Visit: Payer: Self-pay | Admitting: Physician Assistant

## 2014-04-05 ENCOUNTER — Encounter: Payer: Medicare Other | Admitting: Family Medicine

## 2014-05-05 ENCOUNTER — Other Ambulatory Visit: Payer: Self-pay | Admitting: Family Medicine

## 2014-06-02 ENCOUNTER — Other Ambulatory Visit: Payer: Self-pay | Admitting: Family Medicine

## 2014-06-11 ENCOUNTER — Other Ambulatory Visit: Payer: Self-pay | Admitting: Family Medicine

## 2014-06-11 ENCOUNTER — Other Ambulatory Visit: Payer: Self-pay | Admitting: Physician Assistant

## 2014-06-27 ENCOUNTER — Other Ambulatory Visit: Payer: Self-pay | Admitting: Family Medicine

## 2014-07-01 ENCOUNTER — Other Ambulatory Visit: Payer: Self-pay | Admitting: Physician Assistant

## 2014-07-10 LAB — HM DIABETES EYE EXAM

## 2014-07-16 ENCOUNTER — Encounter: Payer: Self-pay | Admitting: Physician Assistant

## 2014-07-16 ENCOUNTER — Ambulatory Visit (INDEPENDENT_AMBULATORY_CARE_PROVIDER_SITE_OTHER): Payer: Medicare Other | Admitting: Physician Assistant

## 2014-07-16 VITALS — BP 124/79 | HR 88 | Temp 97.5°F | Resp 16 | Ht 68.0 in | Wt 199.2 lb

## 2014-07-16 DIAGNOSIS — Z636 Dependent relative needing care at home: Secondary | ICD-10-CM | POA: Diagnosis not present

## 2014-07-16 DIAGNOSIS — E785 Hyperlipidemia, unspecified: Secondary | ICD-10-CM

## 2014-07-16 DIAGNOSIS — Z125 Encounter for screening for malignant neoplasm of prostate: Secondary | ICD-10-CM

## 2014-07-16 DIAGNOSIS — E119 Type 2 diabetes mellitus without complications: Secondary | ICD-10-CM

## 2014-07-16 DIAGNOSIS — I10 Essential (primary) hypertension: Secondary | ICD-10-CM

## 2014-07-16 DIAGNOSIS — Z1329 Encounter for screening for other suspected endocrine disorder: Secondary | ICD-10-CM

## 2014-07-16 DIAGNOSIS — Z0000 Encounter for general adult medical examination without abnormal findings: Secondary | ICD-10-CM

## 2014-07-16 LAB — COMPREHENSIVE METABOLIC PANEL
ALT: 54 U/L — AB (ref 0–53)
AST: 43 U/L — ABNORMAL HIGH (ref 0–37)
Albumin: 4.7 g/dL (ref 3.5–5.2)
Alkaline Phosphatase: 47 U/L (ref 39–117)
BILIRUBIN TOTAL: 0.6 mg/dL (ref 0.2–1.2)
BUN: 17 mg/dL (ref 6–23)
CO2: 27 mEq/L (ref 19–32)
Calcium: 9.8 mg/dL (ref 8.4–10.5)
Chloride: 98 mEq/L (ref 96–112)
Creat: 1 mg/dL (ref 0.50–1.35)
Glucose, Bld: 119 mg/dL — ABNORMAL HIGH (ref 70–99)
Potassium: 3.7 mEq/L (ref 3.5–5.3)
SODIUM: 136 meq/L (ref 135–145)
TOTAL PROTEIN: 8.4 g/dL — AB (ref 6.0–8.3)

## 2014-07-16 LAB — POCT UA - MICROSCOPIC ONLY
BACTERIA, U MICROSCOPIC: NEGATIVE
CASTS, UR, LPF, POC: NEGATIVE
CRYSTALS, UR, HPF, POC: NEGATIVE
MUCUS UA: NEGATIVE
RBC, urine, microscopic: NEGATIVE
WBC, Ur, HPF, POC: NEGATIVE
YEAST UA: NEGATIVE

## 2014-07-16 LAB — LIPID PANEL
CHOL/HDL RATIO: 7.1 ratio
CHOLESTEROL: 268 mg/dL — AB (ref 0–200)
HDL: 38 mg/dL — ABNORMAL LOW (ref >=40)
LDL Cholesterol: 150 mg/dL — ABNORMAL HIGH (ref 0–99)
Triglycerides: 400 mg/dL — ABNORMAL HIGH (ref <150)
VLDL: 80 mg/dL — ABNORMAL HIGH (ref 0–40)

## 2014-07-16 LAB — POCT URINALYSIS DIPSTICK
BILIRUBIN UA: NEGATIVE
Glucose, UA: NEGATIVE
Ketones, UA: NEGATIVE
LEUKOCYTES UA: NEGATIVE
Nitrite, UA: NEGATIVE
Protein, UA: NEGATIVE
RBC UA: NEGATIVE
SPEC GRAV UA: 1.01
UROBILINOGEN UA: 0.2
pH, UA: 7

## 2014-07-16 LAB — GLUCOSE, POCT (MANUAL RESULT ENTRY): POC Glucose: 112 mg/dl — AB (ref 70–99)

## 2014-07-16 LAB — POCT GLYCOSYLATED HEMOGLOBIN (HGB A1C): Hemoglobin A1C: 7.8

## 2014-07-16 LAB — TSH: TSH: 3.067 u[IU]/mL (ref 0.350–4.500)

## 2014-07-16 NOTE — Progress Notes (Signed)
Subjective:    Patient ID: Martin [name], male    DOB: 05-Apr-1945, 69 y.o.   MRN: [id number]  HPI Patient presents for CPE without any complaints. Says that he does not have time to exercise as he is the main caretaker of his mother that has dementia. Does not cook much and most meals are cereal for breakfast, chicken sandwich for lunch, and food from [employer]. Rarely has vegetables with meals as she prefers meat. Feels that it is healthier to get mom out and about than stay home and cook. Checks home glucose and BP sporadically. Last PM home glucose was 116 and home BP was 120s/80s. Needs refills of medication and has only missed one-two doses of Coreg, but has tolerated medications well.   Adds that he had carpal tunnel release surgery 2 years and although it improved his grip strength and improved his pain, he still has 3 fingers that have not decreased in numbness.   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 Now taking care of mother has become his day to day routine without any help. Is furstrated with his sister that has not been a part of his mother's life in the past 10 years. Has never smoked, and does not drink alcohol, or use illicit drugs.    Review of Systems  Constitutional: Negative.   HENT: Negative.   Eyes: Negative.  Negative for visual disturbance.  Respiratory: Negative.  Negative for shortness of breath and wheezing.   Cardiovascular: Negative.  Negative for chest pain, palpitations and leg swelling.  Gastrointestinal: Negative.  Negative for vomiting, diarrhea and constipation.  Genitourinary: Negative.  Negative for dysuria, hematuria and discharge.  Musculoskeletal: Negative.  Negative for back pain, joint swelling and gait problem.  Neurological: Positive for numbness. Negative for dizziness, seizures, weakness, light-headedness and headaches.  Psychiatric/Behavioral: Negative.       Objective:   Physical Exam    Constitutional: He is oriented to person, place, and time. He appears well-developed and well-nourished. No distress.  Blood pressure 124/79, pulse 88, temperature 97.5 F (36.4 C), temperature source Oral, resp. rate 16, height 5\' 8"  (1.727 m), weight 199 lb 3.2 oz (90.357 kg), SpO2 95 %.  HENT:  Head: Normocephalic and atraumatic.  Right Ear: External ear normal.  Left Ear: External ear normal.  Nose: Nose normal.  Mouth/Throat: Oropharynx is clear and moist. No oropharyngeal exudate.  Eyes: Conjunctivae and EOM are normal. Pupils are equal, round, and reactive to light. Right eye exhibits no discharge. Left eye exhibits no discharge. No scleral icterus.  Neck: Normal range of motion. Neck supple. No JVD present. No thyromegaly present.  Cardiovascular: Normal rate, regular rhythm, normal heart sounds and intact distal pulses.  Exam reveals no gallop and no friction rub.   No murmur heard. Pulmonary/Chest: Effort normal and breath sounds normal. No respiratory distress. He has no wheezes. He has no rales. He exhibits no tenderness.  Abdominal: Soft. Bowel sounds are normal. He exhibits no distension and no mass. There is no tenderness. There is no rebound and no guarding.  Genitourinary: Rectum normal and prostate normal.  Musculoskeletal: Normal range of motion.  Lymphadenopathy:    He has no cervical adenopathy.  Neurological: He is alert and oriented to person, place, and time. He has normal reflexes. No cranial nerve deficit. Coordination normal.  Skin: Skin is warm and dry. No rash noted. He is not diaphoretic. No erythema. No pallor.  Psychiatric: He has a normal mood and affect. His behavior is normal. Judgment and thought content normal.   Results for orders placed or performed in visit on 07/16/14  Lipid panel  Result Value Ref Range   Cholesterol 268 (H) 0 - 200 mg/dL   Triglycerides 400 (H) <150 mg/dL   HDL 38 (L) >=40 mg/dL   Total CHOL/HDL Ratio 7.1 Ratio   VLDL 80 (H)  0 - 40 mg/dL   LDL Cholesterol 150 (H) 0 - 99 mg/dL  TSH  Result Value Ref Range   TSH 3.067 0.350 - 4.500 uIU/mL  PSA  Result Value Ref Range   PSA 0.56 <=4.00 ng/mL  Comprehensive metabolic panel  Result Value Ref Range   Sodium 136 135 - 145 mEq/L   Potassium 3.7 3.5 - 5.3 mEq/L   Chloride 98 96 - 112 mEq/L   CO2 27 19 - 32 mEq/L   Glucose, Bld 119 (H) 70 - 99 mg/dL   BUN 17 6 - 23 mg/dL   Creat 1.00 0.50 - 1.35 mg/dL   Total Bilirubin 0.6 0.2 - 1.2 mg/dL   Alkaline Phosphatase 47 39 - 117 U/L   AST 43 (H) 0 - 37 U/L   ALT 54 (H) 0 - 53 U/L   Total Protein 8.4 (H) 6.0 - 8.3 g/dL   Albumin 4.7 3.5 - 5.2 g/dL   Calcium 9.8 8.4 - 10.5 mg/dL  POCT glucose (manual entry)  Result Value Ref Range   POC Glucose 112 (A) 70 - 99 mg/dl  POCT glycosylated hemoglobin (Hb A1C)  Result Value Ref Range   Hemoglobin A1C 7.8   POCT urinalysis dipstick  Result Value Ref Range   Color, UA yellow    Clarity, UA clear    Glucose, UA neg    Bilirubin, UA neg    Ketones, UA neg    Spec Grav, UA 1.010    Blood, UA neg    pH, UA 7.0    Protein, UA neg    Urobilinogen, UA 0.2    Nitrite, UA neg    Leukocytes, UA Negative   POCT UA - Microscopic Only  Result Value Ref Range   WBC, Ur, HPF, POC neg    RBC, urine, microscopic neg    Bacteria, U Microscopic neg    Mucus, UA neg    Epithelial cells, urine per micros 0-1    Crystals, Ur, HPF, POC neg    Casts, Ur, LPF, POC neg    Yeast, UA neg       Assessment & Plan:  1. Physical exam Discussed lifestyle modifications.   2. HYPERTENSION, BENIGN ESSENTIAL - Lipid panel - carvedilol (COREG) 6.25 MG tablet; Take 0.5 tablets (3.125 mg total) by mouth 2 (two) times daily with a meal.  Dispense: 30 tablet; Refill: 3 - losartan-hydrochlorothiazide (HYZAAR) 100-25 MG per tablet; Take 1 tablet by mouth daily.  Dispense: 30 tablet; Refill: 3  3. Caregiver stress Discussed ways to relieve stress, including meditation and going for  walks.  4. Type 2 diabetes mellitus without complication - POCT glucose (manual entry) - POCT glycosylated hemoglobin (Hb A1C) - POCT urinalysis dipstick - POCT UA - Microscopic Only - Comprehensive metabolic panel - metFORMIN (GLUCOPHAGE) 500 MG tablet; Take 1 tablet (500 mg total) by mouth 2 (two) times daily with a meal.  Dispense: 30 tablet; Refill: 3  5. Screening for prostate cancer - PSA  6. Screening for thyroid disorder - TSH  7. Dyslipidemia Had extensive  conversation regarding elevated labs and diet choices. Discontinue Fenofibrate. He states his understanding and will begin to make modifications. RTC 4 weeks to evaluate how doing on new medication. - simvastatin (ZOCOR) 40 MG tablet; Take 1 tablet (40 mg total) by mouth at bedtime. For 2-3 weeks, take 1/2 pill. Following take whole pill.  Dispense: 90 tablet; Refill: 3   Kevontay Burks PA-C  [facility] 07/18/2014 12:28 PM

## 2014-07-16 NOTE — Patient Instructions (Signed)
Diabetes Mellitus and Food It is important for you to manage your blood sugar (glucose) level. Your blood glucose level can be greatly affected by what you eat. Eating healthier foods in the appropriate amounts throughout the day at about the same time each day will help you control your blood glucose level. It can also help slow or prevent worsening of your diabetes mellitus. Healthy eating may even help you improve the level of your blood pressure and reach or maintain a healthy weight.  HOW CAN FOOD AFFECT ME? Carbohydrates Carbohydrates affect your blood glucose level more than any other type of food. Your dietitian will help you determine how many carbohydrates to eat at each meal and teach you how to count carbohydrates. Counting carbohydrates is important to keep your blood glucose at a healthy level, especially if you are using insulin or taking certain medicines for diabetes mellitus. Alcohol Alcohol can cause sudden decreases in blood glucose (hypoglycemia), especially if you use insulin or take certain medicines for diabetes mellitus. Hypoglycemia can be a life-threatening condition. Symptoms of hypoglycemia (sleepiness, dizziness, and disorientation) are similar to symptoms of having too much alcohol.  If your health care provider has given you approval to drink alcohol, do so in moderation and use the following guidelines:  Women should not have more than one drink per day, and men should not have more than two drinks per day. One drink is equal to:  12 oz of beer.  5 oz of wine.  1 oz of hard liquor.  Do not drink on an empty stomach.  Keep yourself hydrated. Have water, diet soda, or unsweetened iced tea.  Regular soda, juice, and other mixers might contain a lot of carbohydrates and should be counted. WHAT FOODS ARE NOT RECOMMENDED? As you make food choices, it is important to remember that all foods are not the same. Some foods have fewer nutrients per serving than other  foods, even though they might have the same number of calories or carbohydrates. It is difficult to get your body what it needs when you eat foods with fewer nutrients. Examples of foods that you should avoid that are high in calories and carbohydrates but low in nutrients include:  Trans fats (most processed foods list trans fats on the Nutrition Facts label).  Regular soda.  Juice.  Candy.  Sweets, such as cake, pie, doughnuts, and cookies.  Fried foods. WHAT FOODS CAN I EAT? Have nutrient-rich foods, which will nourish your body and keep you healthy. The food you should eat also will depend on several factors, including:  The calories you need.  The medicines you take.  Your weight.  Your blood glucose level.  Your blood pressure level.  Your cholesterol level. You also should eat a variety of foods, including:  Protein, such as meat, poultry, fish, tofu, nuts, and seeds (lean animal proteins are best).  Fruits.  Vegetables.  Dairy products, such as milk, cheese, and yogurt (low fat is best).  Breads, grains, pasta, cereal, rice, and beans.  Fats such as olive oil, trans fat-free margarine, canola oil, avocado, and olives. DOES EVERYONE WITH DIABETES MELLITUS HAVE THE SAME MEAL PLAN? Because every person with diabetes mellitus is different, there is not one meal plan that works for everyone. It is very important that you meet with a dietitian who will help you create a meal plan that is just right for you. Document Released: 11/27/2004 Document Revised: 03/07/2013 Document Reviewed: 01/27/2013 Exit[employer] Patient Information 2015 [employer]. This   information is not intended to replace advice given to you by your health care provider. Make sure you discuss any questions you have with your health care provider.  

## 2014-07-17 LAB — PSA: PSA: 0.56 ng/mL (ref <=4.00)

## 2014-07-18 ENCOUNTER — Other Ambulatory Visit: Payer: Self-pay | Admitting: Family Medicine

## 2014-07-18 ENCOUNTER — Other Ambulatory Visit: Payer: Self-pay | Admitting: Urgent Care

## 2014-07-18 ENCOUNTER — Encounter: Payer: Self-pay | Admitting: Physician Assistant

## 2014-07-18 MED ORDER — CARVEDILOL 6.25 MG PO TABS
3.1250 mg | ORAL_TABLET | Freq: Two times a day (BID) | ORAL | Status: DC
Start: 2014-07-18 — End: 2014-10-25

## 2014-07-18 MED ORDER — METFORMIN HCL 500 MG PO TABS: 500.0000 mg | ORAL_TABLET | Freq: Two times a day (BID) | ORAL | Status: DC

## 2014-07-18 MED ORDER — SIMVASTATIN 40 MG PO TABS: 40.0000 mg | ORAL_TABLET | Freq: Every day | ORAL | Status: DC

## 2014-07-18 MED ORDER — LOSARTAN POTASSIUM-HCTZ 100-25 MG PO TABS: 1.0000 | ORAL_TABLET | Freq: Every day | ORAL | Status: DC

## 2014-07-20 ENCOUNTER — Other Ambulatory Visit: Payer: Self-pay

## 2014-07-20 MED ORDER — TAMSULOSIN HCL 0.4 MG PO CAPS: 0.4000 mg | ORAL_CAPSULE | Freq: Every day | ORAL | Status: DC

## 2014-07-30 ENCOUNTER — Encounter: Payer: Self-pay | Admitting: *Deleted

## 2014-08-23 ENCOUNTER — Encounter: Payer: Self-pay | Admitting: Family Medicine

## 2014-10-08 ENCOUNTER — Encounter: Payer: Self-pay | Admitting: Physician Assistant

## 2014-10-08 ENCOUNTER — Ambulatory Visit (INDEPENDENT_AMBULATORY_CARE_PROVIDER_SITE_OTHER): Payer: Medicare Other | Admitting: Physician Assistant

## 2014-10-08 VITALS — BP 110/66 | HR 76 | Temp 97.7°F | Resp 16 | Ht 67.5 in | Wt 189.8 lb

## 2014-10-08 DIAGNOSIS — I10 Essential (primary) hypertension: Secondary | ICD-10-CM

## 2014-10-08 DIAGNOSIS — E119 Type 2 diabetes mellitus without complications: Secondary | ICD-10-CM | POA: Diagnosis not present

## 2014-10-08 DIAGNOSIS — E785 Hyperlipidemia, unspecified: Secondary | ICD-10-CM | POA: Insufficient documentation

## 2014-10-08 LAB — LIPID PANEL
Cholesterol: 136 mg/dL (ref 125–200)
HDL: 40 mg/dL (ref >=40)
LDL Cholesterol: 52 mg/dL (ref <130)
Total CHOL/HDL Ratio: 3.4 Ratio (ref <=5.0)
Triglycerides: 218 mg/dL — ABNORMAL HIGH (ref <150)
VLDL: 44 mg/dL — ABNORMAL HIGH (ref <30)

## 2014-10-08 NOTE — Progress Notes (Signed)
   Subjective:    Patient ID: Martin [name], male    DOB: Mar 09, 1946, 69 y.o.   MRN: [id number]  HPI Patient presents for DM and dyslipidemia follow up. States that he has been well in the interim and has really worked on his diet. He is mostly eating grilled/rotisserie chicken and fish. Cut back on eating his favorite food [place] steak and potatoes. He has also increased his vegetable intake. Has increase water consumption. Has not been able to implement exercise as he his the primary caretaker of his mother and it would not be safe for her. Does not check glucose at home. Denies SOB, CP, HA, dizziness, or N/V. Has been taking simvastatin daily and denies side effects of muscle cramping/pain.   Med allergy: Enalapril  Review of Systems As noted above.    Objective:   Physical Exam  Constitutional: He is oriented to person, place, and time. He appears well-developed and well-nourished. No distress.  Blood pressure 110/66, pulse 76, temperature 97.7 F (36.5 C), temperature source Oral, resp. rate 16, height 5' 7.5" (1.715 m), weight 189 lb 12.8 oz (86.093 kg), SpO2 97 %.   HENT:  Head: Normocephalic and atraumatic.  Right Ear: External ear normal.  Left Ear: External ear normal.  Eyes: Conjunctivae are normal. Pupils are equal, round, and reactive to light. Right eye exhibits no discharge. Left eye exhibits no discharge. No scleral icterus.  Neck: Normal range of motion. Neck supple. No JVD present. Carotid bruit is not present. No thyromegaly present.  Cardiovascular: Normal rate, regular rhythm, normal heart sounds and intact distal pulses.  Exam reveals no gallop and no friction rub.   No murmur heard. Pulmonary/Chest: Effort normal and breath sounds normal. No respiratory distress. He has no wheezes. He has no rales.  Abdominal: Soft. Bowel sounds are normal. He exhibits no distension. There is no tenderness. There is no rebound and no guarding.  Musculoskeletal: He exhibits no  edema.  Lymphadenopathy:    He has no cervical adenopathy.  Neurological: He is alert and oriented to person, place, and time.  Skin: Skin is warm and dry. No rash noted. He is not diaphoretic. No erythema. No pallor.  Psychiatric: He has a normal mood and affect. His behavior is normal. Judgment and thought content normal.   Diabetic Foot Exam - Simple   Simple Foot Form  Diabetic Foot exam was performed with the following findings:  Yes 10/08/2014  2:06 PM  Visual Inspection  Sensation Testing  Pulse Check  Comments  Pedal pulses felt a little on the left foot.        Assessment & Plan:  1. Diabetes mellitus without complication 2. HYPERTENSION, BENIGN ESSENTIAL 3. Dyslipidemia Applauded hard work on diet changes. Due to difficulty of getting out to exercise. Suggested working out within home. RTC in 6 months for f/u. - HM Diabetes Foot Exam - Lipid panel   Alveta Heimlich PA-C  [facility] 10/08/2014 2:38 PM

## 2014-10-12 ENCOUNTER — Telehealth: Payer: Self-pay | Admitting: Physician Assistant

## 2014-10-12 NOTE — Telephone Encounter (Signed)
Relayed lipid panel results. Much improvement from 2 months ago. Continue with diet changes that he has made which he did a great job with and continue simvastatin.

## 2014-10-25 ENCOUNTER — Other Ambulatory Visit: Payer: Self-pay | Admitting: Physician Assistant

## 2014-11-07 ENCOUNTER — Telehealth: Payer: Self-pay | Admitting: Physician Assistant

## 2014-11-07 DIAGNOSIS — E119 Type 2 diabetes mellitus without complications: Secondary | ICD-10-CM

## 2014-11-07 MED ORDER — METFORMIN HCL 500 MG PO TABS: 500.0000 mg | ORAL_TABLET | Freq: Two times a day (BID) | ORAL | Status: DC

## 2014-11-07 NOTE — Telephone Encounter (Signed)
Spoke with patient was only taking metformin nightly. Will start taking BID tomorrow. Refilled medication.

## 2014-12-01 ENCOUNTER — Other Ambulatory Visit: Payer: Self-pay | Admitting: Physician Assistant

## 2014-12-04 ENCOUNTER — Ambulatory Visit (INDEPENDENT_AMBULATORY_CARE_PROVIDER_SITE_OTHER): Payer: Medicare Other | Admitting: *Deleted

## 2014-12-04 DIAGNOSIS — Z23 Encounter for immunization: Secondary | ICD-10-CM

## 2014-12-21 ENCOUNTER — Ambulatory Visit (INDEPENDENT_AMBULATORY_CARE_PROVIDER_SITE_OTHER): Payer: Medicare Other | Admitting: Emergency Medicine

## 2014-12-21 ENCOUNTER — Emergency Department (HOSPITAL_COMMUNITY)
Admission: EM | Admit: 2014-12-21 | Discharge: 2014-12-21 | Disposition: A | Discharge status: Home / Self Care | Payer: Medicare Other | Attending: Emergency Medicine | Admitting: Emergency Medicine

## 2014-12-21 ENCOUNTER — Encounter (HOSPITAL_COMMUNITY): Payer: Self-pay | Admitting: *Deleted

## 2014-12-21 ENCOUNTER — Emergency Department (HOSPITAL_COMMUNITY): Payer: Medicare Other

## 2014-12-21 ENCOUNTER — Telehealth: Payer: Self-pay

## 2014-12-21 VITALS — BP 114/60 | HR 66 | Temp 98.2°F | Resp 20 | Ht 67.5 in | Wt 183.8 lb

## 2014-12-21 DIAGNOSIS — N400 Enlarged prostate without lower urinary tract symptoms: Secondary | ICD-10-CM | POA: Insufficient documentation

## 2014-12-21 DIAGNOSIS — I10 Essential (primary) hypertension: Secondary | ICD-10-CM | POA: Diagnosis not present

## 2014-12-21 DIAGNOSIS — R001 Bradycardia, unspecified: Secondary | ICD-10-CM

## 2014-12-21 DIAGNOSIS — E785 Hyperlipidemia, unspecified: Secondary | ICD-10-CM | POA: Insufficient documentation

## 2014-12-21 DIAGNOSIS — R079 Chest pain, unspecified: Secondary | ICD-10-CM

## 2014-12-21 DIAGNOSIS — I959 Hypotension, unspecified: Secondary | ICD-10-CM

## 2014-12-21 DIAGNOSIS — H409 Unspecified glaucoma: Secondary | ICD-10-CM | POA: Diagnosis not present

## 2014-12-21 DIAGNOSIS — R1032 Left lower quadrant pain: Secondary | ICD-10-CM

## 2014-12-21 DIAGNOSIS — Z8659 Personal history of other mental and behavioral disorders: Secondary | ICD-10-CM | POA: Insufficient documentation

## 2014-12-21 DIAGNOSIS — R5383 Other fatigue: Secondary | ICD-10-CM | POA: Diagnosis not present

## 2014-12-21 DIAGNOSIS — K429 Umbilical hernia without obstruction or gangrene: Secondary | ICD-10-CM

## 2014-12-21 DIAGNOSIS — Z79899 Other long term (current) drug therapy: Secondary | ICD-10-CM | POA: Insufficient documentation

## 2014-12-21 DIAGNOSIS — Z8739 Personal history of other diseases of the musculoskeletal system and connective tissue: Secondary | ICD-10-CM | POA: Diagnosis not present

## 2014-12-21 LAB — CBC WITH DIFFERENTIAL/PLATELET
Basophils Absolute: 0.1 10*3/uL (ref 0.0–0.1)
Basophils Relative: 1 %
EOS ABS: 0.1 10*3/uL (ref 0.0–0.7)
EOS PCT: 1 %
HCT: 45.9 % (ref 39.0–52.0)
Hemoglobin: 15.7 g/dL (ref 13.0–17.0)
LYMPHS ABS: 1.3 10*3/uL (ref 0.7–4.0)
Lymphocytes Relative: 12 %
MCH: 31.4 pg (ref 26.0–34.0)
MCHC: 34.2 g/dL (ref 30.0–36.0)
MCV: 91.8 fL (ref 78.0–100.0)
MONO ABS: 0.4 10*3/uL (ref 0.1–1.0)
MONOS PCT: 4 %
Neutro Abs: 9.1 10*3/uL — ABNORMAL HIGH (ref 1.7–7.7)
Neutrophils Relative %: 82 %
Platelets: 223 10*3/uL (ref 150–400)
RBC: 5 MIL/uL (ref 4.22–5.81)
RDW: 13.4 % (ref 11.5–15.5)
WBC: 10.9 10*3/uL — AB (ref 4.0–10.5)

## 2014-12-21 LAB — URINALYSIS, ROUTINE W REFLEX MICROSCOPIC
BILIRUBIN URINE: NEGATIVE
GLUCOSE, UA: NEGATIVE mg/dL
HGB URINE DIPSTICK: NEGATIVE
KETONES UR: NEGATIVE mg/dL
Leukocytes, UA: NEGATIVE
Nitrite: NEGATIVE
PH: 6.5 (ref 5.0–8.0)
PROTEIN: NEGATIVE mg/dL
Specific Gravity, Urine: 1.01 (ref 1.005–1.030)
Urobilinogen, UA: 0.2 mg/dL (ref 0.0–1.0)

## 2014-12-21 LAB — COMPREHENSIVE METABOLIC PANEL
ALBUMIN: 4.2 g/dL (ref 3.5–5.0)
ALK PHOS: 48 U/L (ref 38–126)
ALT: 25 U/L (ref 17–63)
ANION GAP: 11 (ref 5–15)
AST: 29 U/L (ref 15–41)
BILIRUBIN TOTAL: 0.8 mg/dL (ref 0.3–1.2)
BUN: 14 mg/dL (ref 6–20)
CALCIUM: 9.8 mg/dL (ref 8.9–10.3)
CO2: 28 mmol/L (ref 22–32)
CREATININE: 0.94 mg/dL (ref 0.61–1.24)
Chloride: 98 mmol/L — ABNORMAL LOW (ref 101–111)
GFR calc Af Amer: >60 mL/min (ref >60)
GFR calc non Af Amer: >60 mL/min (ref >60)
GLUCOSE: 117 mg/dL — AB (ref 65–99)
Potassium: 3.5 mmol/L (ref 3.5–5.1)
Sodium: 137 mmol/L (ref 135–145)
TOTAL PROTEIN: 8 g/dL (ref 6.5–8.1)

## 2014-12-21 LAB — LIPASE, BLOOD: Lipase: 34 U/L (ref 22–51)

## 2014-12-21 LAB — I-STAT TROPONIN, ED: Troponin i, poc: 0 ng/mL (ref 0.00–0.08)

## 2014-12-21 LAB — TROPONIN I: Troponin I: <0.03 ng/mL (ref <0.031)

## 2014-12-21 LAB — I-STAT CG4 LACTIC ACID, ED: Lactic Acid, Venous: 1.94 mmol/L (ref 0.5–2.0)

## 2014-12-21 MED ORDER — SODIUM CHLORIDE 0.9 % IV BOLUS (SEPSIS)
1000.0000 mL | Freq: Once | INTRAVENOUS | Status: AC
  Administered 2014-12-21: 1000 mL via INTRAVENOUS

## 2014-12-21 NOTE — ED Notes (Signed)
Pt ambulating independently w/ steady gait on d/c in no acute distress, A&Ox4.D/c instructions reviewed w/ pt and family - pt and family deny any further questions or concerns at present.  

## 2014-12-21 NOTE — ED Notes (Signed)
Patient transported to CT 

## 2014-12-21 NOTE — Discharge Instructions (Signed)
Abdominal Pain, Adult °Many things can cause abdominal pain. Usually, abdominal pain is not caused by a disease and will improve without treatment. It can often be observed and treated at home. Your health care provider will do a physical exam and possibly order blood tests and X-rays to help determine the seriousness of your pain. However, in many cases, more time must pass before a clear cause of the pain can be found. Before that point, your health care provider may not know if you need more testing or further treatment. °HOME CARE INSTRUCTIONS °Monitor your abdominal pain for any changes. The following actions may help to alleviate any discomfort you are experiencing: °· Only take over-the-counter or prescription medicines as directed by your health care provider. °· Do not take laxatives unless directed to do so by your health care provider. °· Try a clear liquid diet (broth, tea, or water) as directed by your health care provider. Slowly move to a bland diet as tolerated. °SEEK MEDICAL CARE IF: °· You have unexplained abdominal pain. °· You have abdominal pain associated with nausea or diarrhea. °· You have pain when you urinate or have a bowel movement. °· You experience abdominal pain that wakes you in the night. °· You have abdominal pain that is worsened or improved by eating food. °· You have abdominal pain that is worsened with eating fatty foods. °· You have a fever. °SEEK IMMEDIATE MEDICAL CARE IF: °· Your pain does not go away within 2 hours. °· You keep throwing up (vomiting). °· Your pain is felt only in portions of the abdomen, such as the right side or the left lower portion of the abdomen. °· You pass bloody or black tarry stools. °MAKE SURE YOU: °· Understand these instructions. °· Will watch your condition. °· Will get help right away if you are not doing well or get worse. °  °This information is not intended to replace advice given to you by your health care provider. Make sure you discuss  any questions you have with your health care provider. °  °Document Released: 12/10/2004 Document Revised: 11/21/2014 Document Reviewed: 11/09/2012 °Elsevier Interactive Patient Education ©2016 [employer] °Nonspecific Chest Pain  °Chest pain can be caused by many different conditions. There is always a chance that your pain could be related to something serious, such as a heart attack or a blood clot in your lungs. Chest pain can also be caused by conditions that are not life-threatening. If you have chest pain, it is very important to follow up with your health care provider. °CAUSES  °Chest pain can be caused by: °· Heartburn. °· Pneumonia or bronchitis. °· Anxiety or stress. °· Inflammation around your heart (pericarditis) or lung (pleuritis or pleurisy). °· A blood clot in your lung. °· A collapsed lung (pneumothorax). It can develop suddenly on its own (spontaneous pneumothorax) or from trauma to the chest. °· Shingles infection (varicella-zoster virus). °· Heart attack. °· Damage to the bones, muscles, and cartilage that make up your chest wall. This can include: °· Bruised bones due to injury. °· Strained muscles or cartilage due to frequent or repeated coughing or overwork. °· Fracture to one or more ribs. °· Sore cartilage due to inflammation (costochondritis). °RISK FACTORS  °Risk factors for chest pain may include: °· Activities that increase your risk for trauma or injury to your chest. °· Respiratory infections or conditions that cause frequent coughing. °· Medical conditions or overeating that can cause heartburn. °· Heart disease or family history   of heart disease. °· Conditions or health behaviors that increase your risk of developing a blood clot. °· Having had chicken pox (varicella zoster). °SIGNS AND SYMPTOMS °Chest pain can feel like: °· Burning or tingling on the surface of your chest or deep in your chest. °· Crushing, pressure, aching, or squeezing pain. °· Dull or sharp pain that is worse  when you move, cough, or take a deep breath. °· Pain that is also felt in your back, neck, shoulder, or arm, or pain that spreads to any of these areas. °Your chest pain may come and go, or it may stay constant. °DIAGNOSIS °Lab tests or other studies may be needed to find the cause of your pain. Your health care provider may have you take a test called an ambulatory ECG (electrocardiogram). An ECG records your heartbeat patterns at the time the test is performed. You may also have other tests, such as: °· Transthoracic echocardiogram (TTE). During echocardiography, sound waves are used to create a picture of all of the heart structures and to look at how blood flows through your heart. °· Transesophageal echocardiogram (TEE). This is a more advanced imaging test that obtains images from inside your body. It allows your health care provider to see your heart in finer detail. °· Cardiac monitoring. This allows your health care provider to monitor your heart rate and rhythm in real time. °· Holter monitor. This is a portable device that records your heartbeat and can help to diagnose abnormal heartbeats. It allows your health care provider to track your heart activity for several days, if needed. °· Stress tests. These can be done through exercise or by taking medicine that makes your heart beat more quickly. °· Blood tests. °· Imaging tests. °TREATMENT  °Your treatment depends on what is causing your chest pain. Treatment may include: °· Medicines. These may include: °¨ Acid blockers for heartburn. °¨ Anti-inflammatory medicine. °¨ Pain medicine for inflammatory conditions. °¨ Antibiotic medicine, if an infection is present. °¨ Medicines to dissolve blood clots. °¨ Medicines to treat coronary artery disease. °· Supportive care for conditions that do not require medicines. This may include: °¨ Resting. °¨ Applying heat or cold packs to injured areas. °¨ Limiting activities until pain decreases. °HOME CARE  INSTRUCTIONS °· If you were prescribed an antibiotic medicine, finish it all even if you start to feel better. °· Avoid any activities that bring on chest pain. °· Do not use any tobacco products, including cigarettes, chewing tobacco, or electronic cigarettes. If you need help quitting, ask your health care provider. °· Do not drink alcohol. °· Take medicines only as directed by your health care provider. °· Keep all follow-up visits as directed by your health care provider. This is important. This includes any further testing if your chest pain does not go away. °· If heartburn is the cause for your chest pain, you may be told to keep your head raised (elevated) while sleeping. This reduces the chance that acid will go from your stomach into your esophagus. °· Make lifestyle changes as directed by your health care provider. These may include: °¨ Getting regular exercise. Ask your health care provider to suggest some activities that are safe for you. °¨ Eating a heart-healthy diet. A registered dietitian can help you to learn healthy eating options. °¨ Maintaining a healthy weight. °¨ Managing diabetes, if necessary. °¨ Reducing stress. °SEEK MEDICAL CARE IF: °· Your chest pain does not go away after treatment. °· You have a rash with blisters   on your chest. °· You have a fever. °SEEK IMMEDIATE MEDICAL CARE IF:  °· Your chest pain is worse. °· You have an increasing cough, or you cough up blood. °· You have severe abdominal pain. °· You have severe weakness. °· You faint. °· You have chills. °· You have sudden, unexplained chest discomfort. °· You have sudden, unexplained discomfort in your arms, back, neck, or jaw. °· You have shortness of breath at any time. °· You suddenly start to sweat, or your skin gets clammy. °· You feel nauseous or you vomit. °· You suddenly feel light-headed or dizzy. °· Your heart begins to beat quickly, or it feels like it is skipping beats. °These symptoms may represent a serious  problem that is an emergency. Do not wait to see if the symptoms will go away. Get medical help right away. Call your local emergency services (911 in the [place] Do not drive yourself to the hospital. °  °This information is not intended to replace advice given to you by your health care provider. Make sure you discuss any questions you have with your health care provider. °  °Document Released: 12/10/2004 Document Revised: 03/23/2014 Document Reviewed: 10/06/2013 °Elsevier Interactive Patient Education ©2016 [employer]. ° °

## 2014-12-21 NOTE — Progress Notes (Signed)
Patient ID: Martin [name], male    DOB: 1945/07/17, 69 y.o.   MRN: [id number]  PCP: Nolene Bernheim, PA-C  Subjective:   Chief Complaint  Patient presents with  . Flank Pain    x today  . Fatigue    x before 12:00 PM today    HPI Presents for evaluation of extreme fatigue.  Just before 12 o'clock noon today he got up to go get sandwiches for lunch. He felt hot all over briefly and then extremely "wiped out." He noted some LEFT lower abdominal pain and LEFT chest pain. He is not able to qualify the pain in either location, but both improved with lying down. He thought about calling 911, but decided to come here for evaluation.  The chest pain "wasn't like anything I've read about what a heart attack is supposed to feel like." It was initially rated a 3, now a zero.  The abdominal pain was a 6, a 3 upon arrival and initial evaluation. He notes that he has an umbilical hernia that was poking out this morning. Not usually painful, and wasn't painful this morning, just "poking out."  No dizziness, HA, vision change. No confusion. No shortness of breath. No heart palpitations. No neck or jaw or arm pain. No urinary urgency, frequency or burning. No hematuria. No diarrhea, constipation. No nausea or vomiting.  No previous symptoms like this. No personal history of heart disease.    Review of Systems  Constitutional: Positive for fatigue. Negative for fever, chills, diaphoresis and unexpected weight change.  HENT: Negative.   Eyes: Negative for photophobia and visual disturbance.  Respiratory: Negative for cough, choking, chest tightness, shortness of breath and wheezing.   Cardiovascular: Positive for chest pain. Negative for palpitations and leg swelling.  Gastrointestinal: Positive for abdominal pain. Negative for nausea, vomiting, diarrhea, constipation and blood in stool.  Genitourinary: Positive for flank pain (LLQ abdominal pain). Negative for dysuria, urgency, frequency,  hematuria, decreased urine volume and difficulty urinating.  Musculoskeletal: Negative for myalgias, back pain, joint swelling, arthralgias, gait problem, neck pain and neck stiffness.  Skin: Negative for rash and wound.  Neurological: Positive for weakness. Negative for dizziness, syncope, speech difficulty, light-headedness and headaches.  Hematological: Negative for adenopathy. Does not bruise/bleed easily.       Patient Active Problem List   Diagnosis Date Noted  . Dyslipidemia 10/08/2014  . Hx of complete eye exam 07/19/2013  . Diabetes mellitus ([place]) 04/28/2013  . Acute kidney injury ([place]) 07/08/2012  . Caregiver stress 04/24/2012  . Metabolic syndrome 41/74/0814  . Dyspnea 12/24/2011  . Myofascial pain dysfunction syndrome 07/24/2011  . DDD (degenerative disc disease), cervical 06/24/2011  . Preventative health care 11/21/2010  . Anxiety 11/21/2010  . Depression 11/21/2010  . TRANSAMINASES, SERUM, ELEVATED 04/03/2010  . Allergic rhinitis due to other allergen 07/18/2008  . HYPERLIPIDEMIA 01/11/2008  . HYPERTENSION, BENIGN ESSENTIAL 01/11/2008  . HYPERTROPHY PROSTATE W/UR OBST & OTH LUTS 01/11/2008     Prior to Admission medications   Medication Sig Start Date End Date Taking? Authorizing Provider  carvedilol (COREG) 6.25 MG tablet TAKE 0.5 TABLETS BY MOUTH 2 TIMES DAILY WITH A MEAL. 10/26/14  Yes Tishira R Brewington, PA-C  latanoprost (XALATAN) 0.005 % ophthalmic solution Place 1 drop into both eyes at bedtime.   Yes Historical Provider, MD  losartan-hydrochlorothiazide (HYZAAR) 100-25 MG per tablet TAKE 1 TABLET BY MOUTH DAILY. 12/03/14  Yes Tishira R Brewington, PA-C  metFORMIN (GLUCOPHAGE) 500 MG tablet Take 1  tablet (500 mg total) by mouth 2 (two) times daily with a meal. 11/07/14  Yes Tishira R Brewington, PA-C  simvastatin (ZOCOR) 40 MG tablet Take 1 tablet (40 mg total) by mouth at bedtime. For 2-3 weeks, take 1/2 pill. Following take whole pill. 07/18/14  Yes Tishira  R Brewington, PA-C  tamsulosin (FLOMAX) 0.4 MG CAPS capsule Take 1 capsule (0.4 mg total) by mouth daily. 07/20/14  Yes Tishira R Brewington, PA-C  ALPRAZolam (XANAX) 0.5 MG tablet TAKE 1 TABLET BY MOUTH TWICE A DAY AS NEEDED Patient not taking: Reported on 12/21/2014 01/08/14   Barton Fanny, MD  levocetirizine (XYZAL) 5 MG tablet TAKE 1 TABLET BY MOUTH IN THE EVENING EVERY DAY 11/28/14   Historical Provider, MD     Allergies  Allergen Reactions  . Enalapril Maleate     REACTION: rash       Objective:  Physical Exam  Constitutional: He is oriented to person, place, and time. Vital signs are normal. He appears well-developed and well-nourished. He is active and cooperative. No distress.  BP 82/48 mmHg Pulse 50 Repeat: BP 114/60 mmHg  Pulse 66  Temp(Src) 98.2 F (36.8 C) (Oral)  Resp 20  Ht 5' 7.5" (1.715 m)  Wt 183 lb 12.8 oz (83.371 kg)  BMI 28.35 kg/m2  SpO2 98%   HENT:  Head: Normocephalic and atraumatic.  Right Ear: Hearing normal.  Left Ear: Hearing normal.  Eyes: Conjunctivae are normal. No scleral icterus.  Neck: Normal range of motion. Neck supple. No thyromegaly present.  Cardiovascular: Normal rate, regular rhythm and normal heart sounds.   Pulses:      Radial pulses are 2+ on the right side, and 2+ on the left side.  Pulmonary/Chest: Effort normal and breath sounds normal.  Abdominal: Soft. Normal appearance and bowel sounds are normal. He exhibits no shifting dullness, no distension, no pulsatile liver, no fluid wave, no abdominal bruit, no ascites, no pulsatile midline mass and no mass. There is tenderness in the left lower quadrant. A hernia is present. Hernia confirmed positive in the ventral area (small umbilical hernia noted initially on exam, easily reduced, which caused brief pain, but then abdominal pain resolved entorely).  Lymphadenopathy:       Head (right side): No tonsillar, no preauricular, no posterior auricular and no occipital adenopathy  present.       Head (left side): No tonsillar, no preauricular, no posterior auricular and no occipital adenopathy present.    He has no cervical adenopathy.       Right: No supraclavicular adenopathy present.       Left: No supraclavicular adenopathy present.  Neurological: He is alert and oriented to person, place, and time. No sensory deficit.  Skin: Skin is warm, dry and intact. No rash noted. No cyanosis or erythema. Nails show no clubbing.  Psychiatric: He has a normal mood and affect. His speech is normal and behavior is normal.   EKG: RBBB unchanged from 2014        Assessment & Plan:   1. Hypotension, unspecified hypotension type - EKG 12-Lead  2. Bradycardia - EKG 12-Lead  3. Chest pain, unspecified chest pain type 4. Abdominal pain, left lower quadrant 5. Umbilical hernia, recurrence not specified  To ED via EMS to rule out cardiac cause for episode of hypotension and bradycardia. However, suspect that this was a vagal episode related to what was likely a small hernia incarceration, which is now reduced.  His mother accompanies him, and she has  dementia. If he is admitted, social work will likely need to get involved to arrange care for his mother. There is an aunt who lives locally, but she is difficult to get in touch with.  Patient was discussed with Dr. Everlene Farrier, who also interviewed and examined the patient, reviewed the EKG and developed the treatment plan.   Fara Chute, PA-C Physician Assistant-Certified [facility]

## 2014-12-21 NOTE — ED Notes (Signed)
Pt presents via [facility] from [facility]. Pt had sudden onset left sided chest pain this morning non radiating worse with palpation, also LUQ abdominal pain-known hernia to this area.  Pt pain free upon  Arrival.  Hx: RBBB, high cholesterol.  No diaphoresis/N/V.  EKG RBBB no acute changes, BP-150/100 P-80 R-16 O2-100. Pt a x 4, NAD.

## 2014-12-21 NOTE — Telephone Encounter (Signed)
Pt called and spoke with April, said he suddenly felt hot and then the line dropped. She tried him several times but the line was busy. I was able to get through and patient says he felt better but had been suddenly hot and light headed. Mentioned he had not eaten today. I offered to call 911 but he refused as he cannot leave his elderly mother alone. He is on his way here to be seen and staff will fast track him due to symptoms.

## 2014-12-21 NOTE — ED Provider Notes (Signed)
CSN: [id number]     Arrival date & time 12/21/14  1527 History   First MD Initiated Contact with Patient 12/21/14 1541     Chief Complaint  Patient [name] with  . Chest Pain  . Abdominal Pain     Patient is a 69 y.o. male presenting with chest pain and abdominal pain. The history is provided by the patient. No language interpreter was used.  Chest Pain Associated symptoms: abdominal pain   Abdominal Pain Associated symptoms: chest pain    Martin [name] for evaluation of chest pain and abdominal pain.  At noon today he was working at his computer and felt hot all over.  At the same time he developed left sided chest pain and LLQ abdominal pain.  The pain is described as a "pain" and was constant in nature.  He noticed that his umbilical hernia was sticking out a little more than usual today but still soft.  He felt very fatigued and like he needed to lie down.  His sxs slightly improved and he went to urgent care where his blood pressure was noted to be 82/50.  When his umbilical hernia was palpated he states he felt a brief pain and then all his pain resolved.  He currently denies any pain and feels well.  He thinks he may have taken an extra dose of losartan today.    Past Medical History  Diagnosis Date  . Hyperlipidemia   . Hypertension   . BPH (benign prostatic hyperplasia)   . Glaucoma     Bil.  . Cataract   . Impaired glucose tolerance 11/20/2010  . Allergic rhinitis due to allergen   . Anxiety   . Depressed   . DDD (degenerative disc disease), cervical   . Myofascial pain dysfunction syndrome   . Metabolic syndrome   . Caregiver stress   . Dyspnea    Past Surgical History  Procedure Laterality Date  . Shoulder arthroscopy   right shoulder    with bone spurs  . Tonsilectomy, adenoidectomy, bilateral myringotomy and tubes  1959  . Back surgery      29+years ago  . Carpal tunnel release Right    Family History  Problem Relation Age of Onset  . Alcohol abuse  Other   . Diabetes Other   . Hyperlipidemia Other   . Kidney disease Other   . Coronary artery disease Other   . Heart disease Mother   . Dementia Mother   . Heart disease Father   . Coronary artery disease Father    Social History  Substance Use Topics  . Smoking status: Never Smoker   . Smokeless tobacco: Never Used  . Alcohol Use: No    Review of Systems  Cardiovascular: Positive for chest pain.  Gastrointestinal: Positive for abdominal pain.  All other systems reviewed and are negative.     Allergies  Enalapril maleate  Home Medications   Prior to Admission medications   Medication Sig Start Date End Date Taking? Authorizing Provider  ALPRAZolam Duanne Moron) 0.5 MG tablet TAKE 1 TABLET BY MOUTH TWICE A DAY AS NEEDED Patient not taking: Reported on 12/21/2014 01/08/14   Barton Fanny, MD  carvedilol (COREG) 6.25 MG tablet TAKE 0.5 TABLETS BY MOUTH 2 TIMES DAILY WITH A MEAL. 10/26/14   Tishira R Brewington, PA-C  latanoprost (XALATAN) 0.005 % ophthalmic solution Place 1 drop into both eyes at bedtime.    Historical Provider, MD  levocetirizine (XYZAL) 5 MG tablet TAKE 1  TABLET BY MOUTH IN THE EVENING EVERY DAY 11/28/14   Historical Provider, MD  losartan-hydrochlorothiazide (HYZAAR) 100-25 MG per tablet TAKE 1 TABLET BY MOUTH DAILY. 12/03/14   Tishira R Brewington, PA-C  metFORMIN (GLUCOPHAGE) 500 MG tablet Take 1 tablet (500 mg total) by mouth 2 (two) times daily with a meal. 11/07/14   Tishira R Brewington, PA-C  simvastatin (ZOCOR) 40 MG tablet Take 1 tablet (40 mg total) by mouth at bedtime. For 2-3 weeks, take 1/2 pill. Following take whole pill. 07/18/14   Tishira R Brewington, PA-C  tamsulosin (FLOMAX) 0.4 MG CAPS capsule Take 1 capsule (0.4 mg total) by mouth daily. 07/20/14   Tishira R Brewington, PA-C   BP 134/66 mmHg  Pulse 74  Temp(Src) 98.1 F (36.7 C) (Oral)  Resp 14  Ht 5\' 7"  (1.702 m)  Wt 178 lb (80.74 kg)  BMI 27.87 kg/m2  SpO2 99% Physical Exam   Constitutional: He is oriented to person, place, and time. He appears well-developed and well-nourished.  HENT:  Head: Normocephalic and atraumatic.  Cardiovascular: Normal rate and regular rhythm.   No murmur heard. Pulmonary/Chest: Effort normal and breath sounds normal. No respiratory distress.  Abdominal: Soft. There is no tenderness. There is no rebound and no guarding.  Umbilical hernia that is easily reducible on examination.    Musculoskeletal: He exhibits no edema or tenderness.  Neurological: He is alert and oriented to person, place, and time.  Skin: Skin is warm and dry.  Psychiatric: He has a normal mood and affect. His behavior is normal.  Nursing note and vitals reviewed.   ED Course  Procedures (including critical care time) Labs Review Labs Reviewed  COMPREHENSIVE METABOLIC PANEL - Abnormal; Notable for the following:    Chloride 98 (*)    Glucose, Bld 117 (*)    All other components within normal limits  CBC WITH DIFFERENTIAL/PLATELET - Abnormal; Notable for the following:    WBC 10.9 (*)    Neutro Abs 9.1 (*)    All other components within normal limits  LIPASE, BLOOD  TROPONIN I  URINALYSIS, ROUTINE W REFLEX MICROSCOPIC (NOT AT [facility])  I-STAT CG4 LACTIC ACID, ED  Randolm Idol, ED    Imaging Review Dg Chest 2 View  12/21/2014   CLINICAL DATA:  Left-sided chest pain today.  EXAM: CHEST  2 VIEW  COMPARISON:  November 02, 2011.  FINDINGS: The heart size and mediastinal contours are within normal limits. Both lungs are clear. No pneumothorax or pleural effusion is noted. Moderate dextroscoliosis of lower thoracic spine is noted.  IMPRESSION: No active cardiopulmonary disease.   Electronically Signed   By: Marijo Conception, M.D.   On: 12/21/2014 18:15   Ct Renal Stone Study  12/21/2014   CLINICAL DATA:  Left-sided pain, worse on palpation.  EXAM: CT ABDOMEN AND PELVIS WITHOUT CONTRAST  TECHNIQUE: Multidetector CT imaging of the abdomen and pelvis was performed  following the standard protocol without IV contrast.  COMPARISON:  Renal ultrasound dated 07/07/2012  FINDINGS: No intrarenal or proximal ureteral calculi on either side. No evidence of hydronephrosis or other secondary signs of upper urinary tract obstruction. There is cortical irregularity and bilateral perirenal fat stranding.  Within limits of unenhanced technique, remaining visualized upper abdomen unremarkable. Visualized extreme lung bases clear.  No distal ureteral calculi on either side. The prostate is enlarged and contains coarse calcifications.  There are no lytic or sclerotic bony lesions. There is an S-shaped thoracolumbar scoliosis with associated osteoarthritic changes.  IMPRESSION: No evidence of renal calculi or obstructive uropathy.  Bilateral renal cortical scarring and perirenal fat stranding.  Solid abdominal organs have normal nonenhanced appearance.  Enlargement of the prostate gland with coarse calcifications.   Electronically Signed   By: Fidela Salisbury M.D.   On: 12/21/2014 22:09   I have personally reviewed and evaluated these images and lab results as part of my medical decision-making.   EKG Interpretation   Date/Time:  Friday December 21 2014 15:39:52 EDT Ventricular Rate:  74 PR Interval:  171 QRS Duration: 147 QT Interval:  416 QTC Calculation: 461 R Axis:   102 Text Interpretation:  Sinus rhythm RBBB and LPFB Confirmed by Martin [name]  [phone]) on 12/21/2014 3:49:48 PM      MDM   Final diagnoses:  Chest pain, unspecified chest pain type  Left lower quadrant pain    Pt here for evaluation of chest pain, abdominal pain, hypotensive prior to ED arrival.  In the ED he is asymptomatic - hypotension and all pain have resolved.  He believes he may have taken an extra dose of his BP meds today.  Clinical picture is not c/w ACS, PE, CHF, pna.  Ct obtained to r/o stone or AAA as source of AP earlier in the day.  Discussed with patient findings of studies, PCP follow  up, return precautions.      Quintella Reichert, MD 12/22/14 [phone]

## 2014-12-27 ENCOUNTER — Telehealth: Payer: Self-pay

## 2014-12-27 MED ORDER — LEVOCETIRIZINE DIHYDROCHLORIDE 5 MG PO TABS: ORAL_TABLET | ORAL | Status: DC

## 2014-12-27 NOTE — Telephone Encounter (Signed)
Tishira, you just saw this pt for check up, but don't see this med discussed recently (was on pt's med list at last OV). Do you want to OK RFs?

## 2014-12-27 NOTE — Telephone Encounter (Signed)
Please inform patient that rx sent.

## 2014-12-28 NOTE — Telephone Encounter (Signed)
Called pt and advised message from provider on their voicemail.  

## 2015-01-12 ENCOUNTER — Other Ambulatory Visit: Payer: Self-pay | Admitting: Physician Assistant

## 2015-02-25 ENCOUNTER — Ambulatory Visit: Payer: Medicare Other | Admitting: Physician Assistant

## 2015-03-05 ENCOUNTER — Encounter: Payer: Self-pay | Admitting: Physician Assistant

## 2015-03-05 ENCOUNTER — Ambulatory Visit (INDEPENDENT_AMBULATORY_CARE_PROVIDER_SITE_OTHER): Payer: Medicare Other | Admitting: Physician Assistant

## 2015-03-05 VITALS — BP 126/78 | HR 85 | Temp 97.6°F | Resp 16 | Ht 67.0 in | Wt 186.0 lb

## 2015-03-05 DIAGNOSIS — I10 Essential (primary) hypertension: Secondary | ICD-10-CM

## 2015-03-05 DIAGNOSIS — E119 Type 2 diabetes mellitus without complications: Secondary | ICD-10-CM

## 2015-03-05 DIAGNOSIS — R413 Other amnesia: Secondary | ICD-10-CM | POA: Diagnosis not present

## 2015-03-05 DIAGNOSIS — Z23 Encounter for immunization: Secondary | ICD-10-CM

## 2015-03-05 DIAGNOSIS — E785 Hyperlipidemia, unspecified: Secondary | ICD-10-CM

## 2015-03-05 DIAGNOSIS — Z636 Dependent relative needing care at home: Secondary | ICD-10-CM | POA: Diagnosis not present

## 2015-03-05 LAB — COMPREHENSIVE METABOLIC PANEL
ALBUMIN: 4.6 g/dL (ref 3.6–5.1)
ALT: 21 U/L (ref 9–46)
AST: 19 U/L (ref 10–35)
Alkaline Phosphatase: 43 U/L (ref 40–115)
BUN: 16 mg/dL (ref 7–25)
CHLORIDE: 98 mmol/L (ref 98–110)
CO2: 30 mmol/L (ref 20–31)
Calcium: 9.7 mg/dL (ref 8.6–10.3)
Creat: 0.75 mg/dL (ref 0.70–1.25)
Glucose, Bld: 93 mg/dL (ref 65–99)
POTASSIUM: 3.9 mmol/L (ref 3.5–5.3)
Sodium: 141 mmol/L (ref 135–146)
TOTAL PROTEIN: 7.7 g/dL (ref 6.1–8.1)
Total Bilirubin: 0.7 mg/dL (ref 0.2–1.2)

## 2015-03-05 LAB — POCT GLYCOSYLATED HEMOGLOBIN (HGB A1C): Hemoglobin A1C: 6.1

## 2015-03-05 MED ORDER — LOSARTAN POTASSIUM-HCTZ 100-25 MG PO TABS: 1.0000 | ORAL_TABLET | Freq: Every day | ORAL | Status: DC

## 2015-03-05 MED ORDER — CARVEDILOL 6.25 MG PO TABS: 6.2500 mg | ORAL_TABLET | Freq: Every day | ORAL | Status: DC

## 2015-03-05 MED ORDER — METFORMIN HCL 500 MG PO TABS: 500.0000 mg | ORAL_TABLET | Freq: Every day | ORAL | Status: AC

## 2015-03-05 NOTE — Patient Instructions (Signed)
Continue coreg at night. Losartan in the morning. Metformin in the morning. Continue with diet. Call your mother's doctor to see about getting her home health so you can have some breaks. Return in May for physical and follow up on diabetes.

## 2015-03-05 NOTE — Progress Notes (Signed)
[facility] [address], [place] [address] [phone]  Date:  03/05/2015   Name:  Martin [name]   DOB:  10-14-1945   MRN:  [id number]  PCP:  Nolene Bernheim, PA-C    Chief Complaint: Follow-up and Diabetes   History of Present Illness:  This is a 69 y.o. male with PMH DM, HLD, HTN, allergic rhinitis who is presenting for follow HTN, HLD, DM.  DM - last A1C 07/2014 7.8. He is prescribed metformin 500 BID but only taking 1 tab in the morning. States if he takes metformin at night he won't be able to sleep. Doesn't usually take blood sugar. Doing well with diet - eating mostly fish, chicken and vegetables. Has steak only once in a while. Doesn't usually exercise d/t stress of being a caregiver. Before mother's dementia progressed he was walking at the mall 20-45 minutes daily and he states that was something he really enjoyed doing. He denies abdominal pain, cp, sob, paresthesias, blurred vision. Last ophtho exam 06/2014. Last diab foot exam 09/2014. Has had pneumovax but never prevnar. Got flu shot 11/2014.  HTN - takes coreg and hyzaar. Takes coreg at night and hyzaar in the AM.  HLD -  Takes zocor 40 QHS.  Pt is overwhelmed with being the sole caregiver for his 87 year old mother with dementia. He is wary of bringing anyone into the home to help or placing her in a long term facility.   Pt states he feels his memory isn't what it used to be. He will be unable to remember certain things but usually that memory will come back to him days later unprovoked. He was in the ED 12/21/14 for chest pain with negative workup but pt can't seem to recall much about that visit and states "october? Really? Seems much longer ago".  Review of Systems:  Review of Systems See HPI  Patient Active Problem List   Diagnosis Date Noted  . Diabetes mellitus ([place]) 04/28/2013  . Acute kidney injury ([place]) 07/08/2012  . Caregiver stress 04/24/2012  . Metabolic syndrome  [id number]  . Dyspnea 12/24/2011  . Myofascial pain dysfunction syndrome 07/24/2011  . DDD (degenerative disc disease), cervical 06/24/2011  . Anxiety 11/21/2010  . Depression 11/21/2010  . TRANSAMINASES, SERUM, ELEVATED 04/03/2010  . Allergic rhinitis due to other allergen 07/18/2008  . HYPERLIPIDEMIA 01/11/2008  . HYPERTENSION, BENIGN ESSENTIAL 01/11/2008  . HYPERTROPHY PROSTATE W/UR OBST & OTH LUTS 01/11/2008    Prior to Admission medications   Medication Sig Start Date End Date Taking? Authorizing Provider  carvedilol (COREG) 6.25 MG tablet TAKE 0.5 TABLETS BY MOUTH 2 TIMES DAILY WITH A MEAL. Patient taking differently: TAKE 6.25MG  BY MOUTH IN THE EVENING 10/26/14  Yes Tishira R Brewington, PA-C  latanoprost (XALATAN) 0.005 % ophthalmic solution Place 1 drop into both eyes at bedtime.   Yes Historical Provider, MD  levocetirizine (XYZAL) 5 MG tablet TAKE 1 TABLET BY MOUTH IN THE EVENING EVERY DAY 12/27/14  Yes Tishira R Brewington, PA-C  losartan-hydrochlorothiazide (HYZAAR) 100-25 MG per tablet TAKE 1 TABLET BY MOUTH DAILY. 12/03/14  Yes Tishira R Brewington, PA-C  metFORMIN (GLUCOPHAGE) 500 MG tablet Take 1 tablet (500 mg total) by mouth 2 (two) times daily with a meal. 11/07/14  Yes Tishira R Brewington, PA-C  simvastatin (ZOCOR) 40 MG tablet Take 1 tablet (40 mg total) by mouth at bedtime. For 2-3 weeks, take 1/2 pill. Following take whole pill. Patient taking differently: Take 40  mg by mouth at bedtime.  07/18/14  Yes Tishira R Brewington, PA-C  tamsulosin (FLOMAX) 0.4 MG CAPS capsule TAKE ONE CAPSULE BY MOUTH EVERY DAY 01/15/15  Yes Bennett Scrape V, PA-C    Allergies  Allergen Reactions  . Enalapril Maleate     REACTION: rash    Past Surgical History  Procedure Laterality Date  . Shoulder arthroscopy   right shoulder    with bone spurs  . Tonsilectomy, adenoidectomy, bilateral myringotomy and tubes  1959  . Back surgery      29+years ago  . Carpal tunnel release Right      Social History  Substance Use Topics  . Smoking status: Never Smoker   . Smokeless tobacco: Never Used  . Alcohol Use: No    Family History  Problem Relation Age of Onset  . Alcohol abuse Other   . Diabetes Other   . Hyperlipidemia Other   . Kidney disease Other   . Coronary artery disease Other   . Heart disease Mother   . Dementia Mother   . Heart disease Father   . Coronary artery disease Father     Medication list has been reviewed and updated.  Physical Examination:  Physical Exam  Constitutional: He is oriented to person, place, and time. He appears well-developed and well-nourished. No distress.  HENT:  Head: Normocephalic and atraumatic.  Right Ear: Hearing normal.  Left Ear: Hearing normal.  Nose: Nose normal.  Eyes: Conjunctivae and lids are normal. Right eye exhibits no discharge. Left eye exhibits no discharge. No scleral icterus.  Neck: Trachea normal. Carotid bruit is not present. No thyromegaly present.  Cardiovascular: Normal rate, regular rhythm, normal heart sounds and normal pulses.   No murmur heard. Pulmonary/Chest: Effort normal and breath sounds normal. No respiratory distress. He has no wheezes. He has no rhonchi. He has no rales.  Abdominal: Soft. Normal appearance. There is no tenderness.  Musculoskeletal: Normal range of motion.  Lymphadenopathy:       Head (right side): No submental, no submandibular and no tonsillar adenopathy present.       Head (left side): No submental, no submandibular and no tonsillar adenopathy present.    He has no cervical adenopathy.  Neurological: He is alert and oriented to person, place, and time.  Skin: Skin is warm, dry and intact. No lesion and no rash noted.  No LE edema Feet without lesions. Pedal pulses intact and symmetric   Psychiatric: He has a normal mood and affect. His speech is normal and behavior is normal. Thought content normal.   BP 126/78 mmHg  Pulse 85  Temp(Src) 97.6 F (36.4 C)   Resp 16  Ht 5\' 7"  (1.702 m)  Wt 186 lb (84.369 kg)  BMI 29.12 kg/m2  Results for orders placed or performed in visit on 03/05/15  POCT glycosylated hemoglobin (Hb A1C)  Result Value Ref Range   Hemoglobin A1C 6.1    MMSE - Mini Mental State Exam 03/05/2015  Orientation to time 5  Orientation to Place 5  Registration 3  Attention/ Calculation 5  Recall 2  Language- name 2 objects 2  Language- repeat 1  Language- follow 3 step command 3  Language- read & follow direction 1  Write a sentence 1  Copy design 1  Total score 29   Assessment and Plan:  1. Type 2 diabetes mellitus without complication, without long-term current use of insulin (HCC) A1C 6.1 down from 7.8 in May. He is only take metformin  500 mg QAM. He has radically changed his diet. He is not exercising. Labs pending. I advised he continue doing what he is doing and if a1c still well controlled at 6 month f/u for CPE, could consider stopping metformin all together. - POCT glycosylated hemoglobin (Hb A1C) - Microalbumin, urine - Comprehensive metabolic panel - metFORMIN (GLUCOPHAGE) 500 MG tablet; Take 1 tablet (500 mg total) by mouth daily with breakfast.  Dispense: 90 tablet; Refill: 1  2. Essential hypertension BP controlled. Meds refilled. F/u at CPE 6 months. - carvedilol (COREG) 6.25 MG tablet; Take 1 tablet (6.25 mg total) by mouth at bedtime.  Dispense: 30 tablet; Refill: 5 - losartan-hydrochlorothiazide (HYZAAR) 100-25 MG tablet; Take 1 tablet by mouth daily.  Dispense: 30 tablet; Refill: 5  3. Hyperlipidemia Will check lipid at CPE. Continue zocor 40 mg.  4. Memory difficulties Mini mental 29/30. Reassuring.  5. Caregiver stress Strongly advised pt to contact his mother's provider and see about getting home health to give him some breaks. He said he would consider it.  6. Need for prophylactic vaccination against Streptococcus pneumoniae (pneumococcus) - Pneumococcal conjugate vaccine 13-valent  IM   Benjaman Pott. Drenda Freeze, MHS [facility]  03/05/2015

## 2015-03-06 LAB — MICROALBUMIN, URINE: Microalb, Ur: 0.3 mg/dL

## 2015-04-11 ENCOUNTER — Other Ambulatory Visit: Payer: Self-pay | Admitting: Physician Assistant

## 2015-04-18 ENCOUNTER — Telehealth: Payer: Self-pay

## 2015-04-18 NOTE — Telephone Encounter (Signed)
Patient changed his drug store to  [facility] on [address]   [facility] is calling saying his meds were ready.   He don't know which ones they are.

## 2015-04-19 NOTE — Telephone Encounter (Signed)
It looks like [facility] responded [facility] for RF of flomax on 04/12/15 and gave a RF. If pt does not want/need the Rx he can call to cancel it or just not p/up and they will put back on shelf after several days. If pt does need a RF of it, he can have his new pharm req a transfer. Please call pt to advise.

## 2015-04-23 NOTE — Telephone Encounter (Signed)
Patient called [facility] straightened out prescriptions

## 2015-05-09 ENCOUNTER — Ambulatory Visit (INDEPENDENT_AMBULATORY_CARE_PROVIDER_SITE_OTHER): Payer: Medicare Other | Admitting: Family Medicine

## 2015-05-09 ENCOUNTER — Ambulatory Visit (INDEPENDENT_AMBULATORY_CARE_PROVIDER_SITE_OTHER): Payer: Medicare Other

## 2015-05-09 VITALS — BP 128/80 | HR 95 | Temp 98.6°F | Resp 16 | Ht 67.5 in | Wt 184.6 lb

## 2015-05-09 DIAGNOSIS — R05 Cough: Secondary | ICD-10-CM

## 2015-05-09 DIAGNOSIS — J101 Influenza due to other identified influenza virus with other respiratory manifestations: Secondary | ICD-10-CM | POA: Diagnosis not present

## 2015-05-09 DIAGNOSIS — E119 Type 2 diabetes mellitus without complications: Secondary | ICD-10-CM | POA: Diagnosis not present

## 2015-05-09 DIAGNOSIS — R059 Cough, unspecified: Secondary | ICD-10-CM

## 2015-05-09 LAB — POCT CBC
Granulocyte percent: 73 %G (ref 37–80)
HCT, POC: 45.5 % (ref 43.5–53.7)
Hemoglobin: 16 g/dL (ref 14.1–18.1)
LYMPH, POC: 1.7 (ref 0.6–3.4)
MCH: 32.3 pg — AB (ref 27–31.2)
MCHC: 35.1 g/dL (ref 31.8–35.4)
MCV: 91.8 fL (ref 80–97)
MID (CBC): 0.5 (ref 0–0.9)
MPV: 7.8 fL (ref 0–99.8)
PLATELET COUNT, POC: 192 10*3/uL (ref 142–424)
POC Granulocyte: 5.9 (ref 2–6.9)
POC LYMPH PERCENT: 20.8 %L (ref 10–50)
POC MID %: 6.2 %M (ref 0–12)
RBC: 4.96 M/uL (ref 4.69–6.13)
RDW, POC: 13.3 %
WBC: 8.1 10*3/uL (ref 4.6–10.2)

## 2015-05-09 LAB — GLUCOSE, POCT (MANUAL RESULT ENTRY): POC Glucose: 95 mg/dl (ref 70–99)

## 2015-05-09 LAB — POCT INFLUENZA A/B
INFLUENZA A, POC: POSITIVE — AB
INFLUENZA B, POC: NEGATIVE

## 2015-05-09 MED ORDER — OSELTAMIVIR PHOSPHATE 75 MG PO CAPS: 75.0000 mg | ORAL_CAPSULE | Freq: Two times a day (BID) | ORAL | Status: DC

## 2015-05-09 NOTE — Patient Instructions (Addendum)
Because you received an x-ray today, you will receive an invoice from [facility]. Please contact [facility] at [phone] with questions or concerns regarding your invoice. Our billing staff will not be able to assist you with those questions.  1.  CONTINUE DELSYM FOR COUGH.  2.  CONTINUE NASAL SALINE FOUR TIMES DAILY FOR NASAL CONGESTION. 3. CONTINUE ZYRTEC. 4.  TAKE TAMIFLU -- ONE TABLET TWICE DAILY.  Influenza, Adult Influenza ("the flu") is a viral infection of the respiratory tract. It occurs more often in winter months because people spend more time in close contact with one another. Influenza can make you feel very sick. Influenza easily spreads from person to person (contagious). CAUSES  Influenza is caused by a virus that infects the respiratory tract. You can catch the virus by breathing in droplets from an infected person's cough or sneeze. You can also catch the virus by touching something that was recently contaminated with the virus and then touching your mouth, nose, or eyes. RISKS AND COMPLICATIONS You may be at risk for a more severe case of influenza if you smoke cigarettes, have diabetes, have chronic heart disease (such as heart failure) or lung disease (such as asthma), or if you have a weakened immune system. Elderly people and pregnant women are also at risk for more serious infections. The most common problem of influenza is a lung infection (pneumonia). Sometimes, this problem can require emergency medical care and may be life threatening. SIGNS AND SYMPTOMS  Symptoms typically last 4 to 10 days and may include:  Fever.  Chills.  Headache, body aches, and muscle aches.  Sore throat.  Chest discomfort and cough.  Poor appetite.  Weakness or feeling tired.  Dizziness.  Nausea or vomiting. DIAGNOSIS  Diagnosis of influenza is often made based on your history and a physical exam. A nose or throat swab test can be done to confirm the  diagnosis. TREATMENT  In mild cases, influenza goes away on its own. Treatment is directed at relieving symptoms. For more severe cases, your health care provider may prescribe antiviral medicines to shorten the sickness. Antibiotic medicines are not effective because the infection is caused by a virus, not by bacteria. HOME CARE INSTRUCTIONS  Take medicines only as directed by your health care provider.  Use a cool mist humidifier to make breathing easier.  Get plenty of rest until your temperature returns to normal. This usually takes 3 to 4 days.  Drink enough fluid to keep your urine clear or pale yellow.  Cover yourmouth and nosewhen coughing or sneezing,and wash your handswellto prevent thevirusfrom spreading.  Stay homefromwork orschool untilthe fever is gonefor at least 34full day. PREVENTION  An annual influenza vaccination (flu shot) is the best way to avoid getting influenza. An annual flu shot is now routinely recommended for all adults in the [place] IF:  You experiencechest pain, yourcough worsens,or you producemore mucus.  Youhave nausea,vomiting, ordiarrhea.  Your fever returns or gets worse. SEEK IMMEDIATE MEDICAL CARE IF:  You havetrouble breathing, you become short of breath,or your skin ornails becomebluish.  You have severe painor stiffnessin the neck.  You develop a sudden headache, or pain in the face or ear.  You have nausea or vomiting that you cannot control. MAKE SURE YOU:   Understand these instructions.  Will watch your condition.  Will get help right away if you are not doing well or get worse.   This information is not intended to replace advice given to  you by your health care provider. Make sure you discuss any questions you have with your health care provider.   Document Released: 02/28/2000 Document Revised: 03/23/2014 Document Reviewed: 06/01/2011 Elsevier Interactive Patient Education [employer]

## 2015-05-09 NOTE — Progress Notes (Signed)
Subjective:    Patient ID: Martin [name], male    DOB: June 29, 1945, 70 y.o.   MRN: [id number]  05/09/2015  Cough; runny nose; and neck   HPI This 70 y.o. male presents for evaluation of cough, congestion.  Onset two days ago.  Unable to sleep due to chest congestion.  No fever/chills/sweats; no body aches; neck is achy.  Chronic neck pain; occurs constantly.  Has been seen by NS for chronic neck pain; nerve inflammation; no surgical intervention recommended.    +HA.  ST initially last night.  No ear pain.  +rhinorrhea; +nasal congestion; took Zyrtec and Delsym.  +coughing; unable to get sputum up. +SOB intermittently.  No vomiting; +diarrhea earlier today. S/p flu vaccine.  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    Review of Systems  Constitutional: Positive for fatigue. Negative for fever, chills and diaphoresis.  HENT: Positive for congestion, postnasal drip, rhinorrhea, sore throat and voice change. Negative for ear pain, sinus pressure and trouble swallowing.   Respiratory: Positive for cough and shortness of breath. Negative for wheezing.   Gastrointestinal: Positive for diarrhea. Negative for nausea and vomiting.  Endocrine: Negative for cold intolerance, heat intolerance, polydipsia, polyphagia and polyuria.  Neurological: Positive for headaches. Negative for dizziness.    Past Medical History  Diagnosis Date  . Hyperlipidemia   . Hypertension   . BPH (benign prostatic hyperplasia)   . Glaucoma     Bil.  . Cataract   . Impaired glucose tolerance 11/20/2010  . Allergic rhinitis due to allergen   . Anxiety   . Depressed   . DDD (degenerative disc disease), cervical   . Myofascial pain dysfunction syndrome   . Metabolic syndrome   . Caregiver stress   . Dyspnea    Past Surgical History  Procedure Laterality Date  . Shoulder arthroscopy   right shoulder    with bone spurs  . Tonsilectomy, adenoidectomy, bilateral myringotomy and tubes  1959  . Back surgery      29+years ago  . Carpal  tunnel release Right    Allergies  Allergen Reactions  . Enalapril Maleate     REACTION: rash    Social History   Social History  . Marital Status: Single    Spouse Name: N/A  . Number of Children: N/A  . Years of Education: N/A   Occupational History  . retired    Social History Main Topics  . Smoking status: Never Smoker   . Smokeless tobacco: Never Used  . Alcohol Use: No  . Drug Use: No  . Sexual Activity: Not Currently   Other Topics Concern  . Not on file   Social History Narrative   No regular exercise.   Lives with his mother, with dementia.   Family History  Problem Relation Age of Onset  . Alcohol abuse Other   . Diabetes Other   . Hyperlipidemia Other   . Kidney disease Other   . Coronary artery disease Other   . Heart disease Mother   . Dementia Mother   . Heart disease Father   . Coronary artery disease Father        Objective:    BP 128/80 mmHg  Pulse 95  Temp(Src) 98.6 F (37 C) (Oral)  Resp 16  Ht 5' 7.5" (1.715 m)  Wt 184 lb 9.6 oz (83.734 kg)  BMI 28.47 kg/m2  SpO2 98% Physical Exam  Constitutional: He is oriented to person, place, and time. He appears well-developed and well-nourished. He appears  ill. No distress.  HENT:  Head: Normocephalic and atraumatic.  Right Ear: Tympanic membrane, external ear and ear canal normal.  Left Ear: Tympanic membrane, external ear and ear canal normal.  Nose: Mucosal edema and rhinorrhea present. Right sinus exhibits no maxillary sinus tenderness and no frontal sinus tenderness. Left sinus exhibits no maxillary sinus tenderness and no frontal sinus tenderness.  Mouth/Throat: Uvula is midline, oropharynx is clear and moist and mucous membranes are normal.  Eyes: Conjunctivae and EOM are normal. Pupils are equal, round, and reactive to light.  Neck: Normal range of motion. Neck supple. Carotid bruit is not present. No thyromegaly present.  Cardiovascular: Normal rate, regular rhythm, normal heart  sounds and intact distal pulses.  Exam reveals no gallop and no friction rub.   No murmur heard. Pulmonary/Chest: Effort normal and breath sounds normal. He has no wheezes. He has no rales.  Lymphadenopathy:    He has no cervical adenopathy.  Neurological: He is alert and oriented to person, place, and time. No cranial nerve deficit.  Skin: Skin is warm and dry. No rash noted. He is not diaphoretic.  Psychiatric: He has a normal mood and affect. His behavior is normal.  Nursing note and vitals reviewed.  Results for orders placed or performed in visit on 05/09/15  POCT CBC  Result Value Ref Range   WBC 8.1 4.6 - 10.2 K/uL   Lymph, poc 1.7 0.6 - 3.4   POC LYMPH PERCENT 20.8 10 - 50 %L   MID (cbc) 0.5 0 - 0.9   POC MID % 6.2 0 - 12 %M   POC Granulocyte 5.9 2 - 6.9   Granulocyte percent 73.0 37 - 80 %G   RBC 4.96 4.69 - 6.13 M/uL   Hemoglobin 16.0 14.1 - 18.1 g/dL   HCT, POC 45.5 43.5 - 53.7 %   MCV 91.8 80 - 97 fL   MCH, POC 32.3 (A) 27 - 31.2 pg   MCHC 35.1 31.8 - 35.4 g/dL   RDW, POC 13.3 %   Platelet Count, POC 192 142 - 424 K/uL   MPV 7.8 0 - 99.8 fL  POCT glucose (manual entry)  Result Value Ref Range   POC Glucose 95 70 - 99 mg/dl  POCT Influenza A/B  Result Value Ref Range   Influenza A, POC Positive (A) Negative   Influenza B, POC Negative Negative   Dg Chest 2 View  05/09/2015  CLINICAL DATA:  Cough and shortness of breath with congestion EXAM: CHEST  2 VIEW COMPARISON:  12/21/2014 FINDINGS: The lungs are clear wiithout focal pneumonia, edema, pneumothorax or pleural effusion. Cardiopericardial silhouette is at upper limits of normal for size. The visualized bony structures of the thorax are intact. IMPRESSION: No active cardiopulmonary disease. Electronically Signed   By: Misty Stanley M.D.   On: 05/09/2015 16:51       Assessment & Plan:   1. Influenza A   2. Cough   3. Type 2 diabetes mellitus without complication, without long-term current use of insulin ([place])     -New. -Rx for Tamiflu provided. -Recommend continued Delsym, nasal saline, Tylenol. -RTC for acute worsening. -monitor sugars closely with acute illness.   Orders Placed This Encounter  Procedures  . DG Chest 2 View    Standing Status: Future     Number of Occurrences: 1     Standing Expiration Date: 05/08/2016    Order Specific Question:  Reason for Exam (SYMPTOM  OR DIAGNOSIS REQUIRED)  Answer:  cough, SOB, congestion    Order Specific Question:  Preferred imaging location?    Answer:  External  . POCT CBC  . POCT glucose (manual entry)  . POCT Influenza A/B   Meds ordered this encounter  Medications  . oseltamivir (TAMIFLU) 75 MG capsule    Sig: Take 1 capsule (75 mg total) by mouth 2 (two) times daily.    Dispense:  10 capsule    Refill:  0    No Follow-up on file.    Kamauri Denardo Elayne Guerin, M.D. [facility] [address] [place], [place]  [id number] [phone] phone [phone] fax

## 2015-05-13 ENCOUNTER — Telehealth: Payer: Self-pay

## 2015-05-13 NOTE — Telephone Encounter (Signed)
Pt was diagnosed with the flu on the 23rd and given tamaflu -has not had a fever for atleast 2 days should it be ok to keep his dentist appt on 05/15/15   Best number [phone]

## 2015-05-14 NOTE — Telephone Encounter (Signed)
Pt advised.

## 2015-05-14 NOTE — Telephone Encounter (Signed)
I would call the dentist and see what they want you to do

## 2015-07-16 ENCOUNTER — Encounter: Payer: Medicare Other | Admitting: Physician Assistant

## 2015-08-06 ENCOUNTER — Other Ambulatory Visit: Payer: Self-pay | Admitting: Physician Assistant

## 2015-08-14 ENCOUNTER — Other Ambulatory Visit: Payer: Self-pay

## 2015-08-14 DIAGNOSIS — E785 Hyperlipidemia, unspecified: Secondary | ICD-10-CM

## 2015-08-14 MED ORDER — SIMVASTATIN 40 MG PO TABS: 40.0000 mg | ORAL_TABLET | Freq: Every day | ORAL | Status: DC

## 2015-09-16 ENCOUNTER — Other Ambulatory Visit: Payer: Self-pay | Admitting: Physician Assistant

## 2015-09-16 IMAGING — CR DG WRIST COMPLETE 3+V*R*
4 series · 4 of 4 positions shown · non-contrast
Comparison: None.

CLINICAL DATA: Right wrist pain and numbness.

EXAM:
RIGHT WRIST - COMPLETE 3+ VIEW

[other]
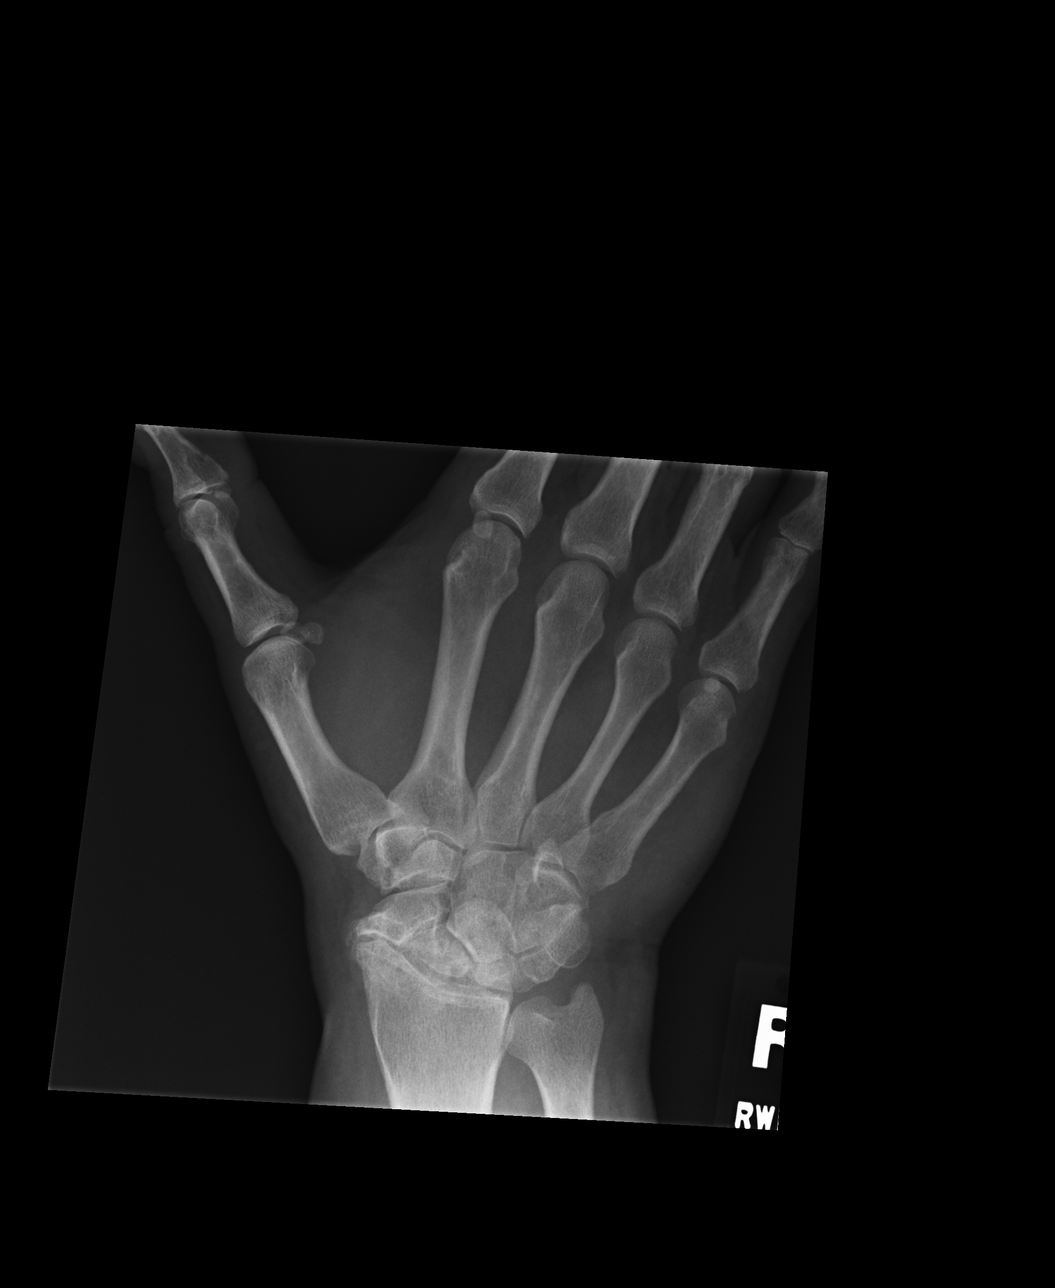

[lateral]
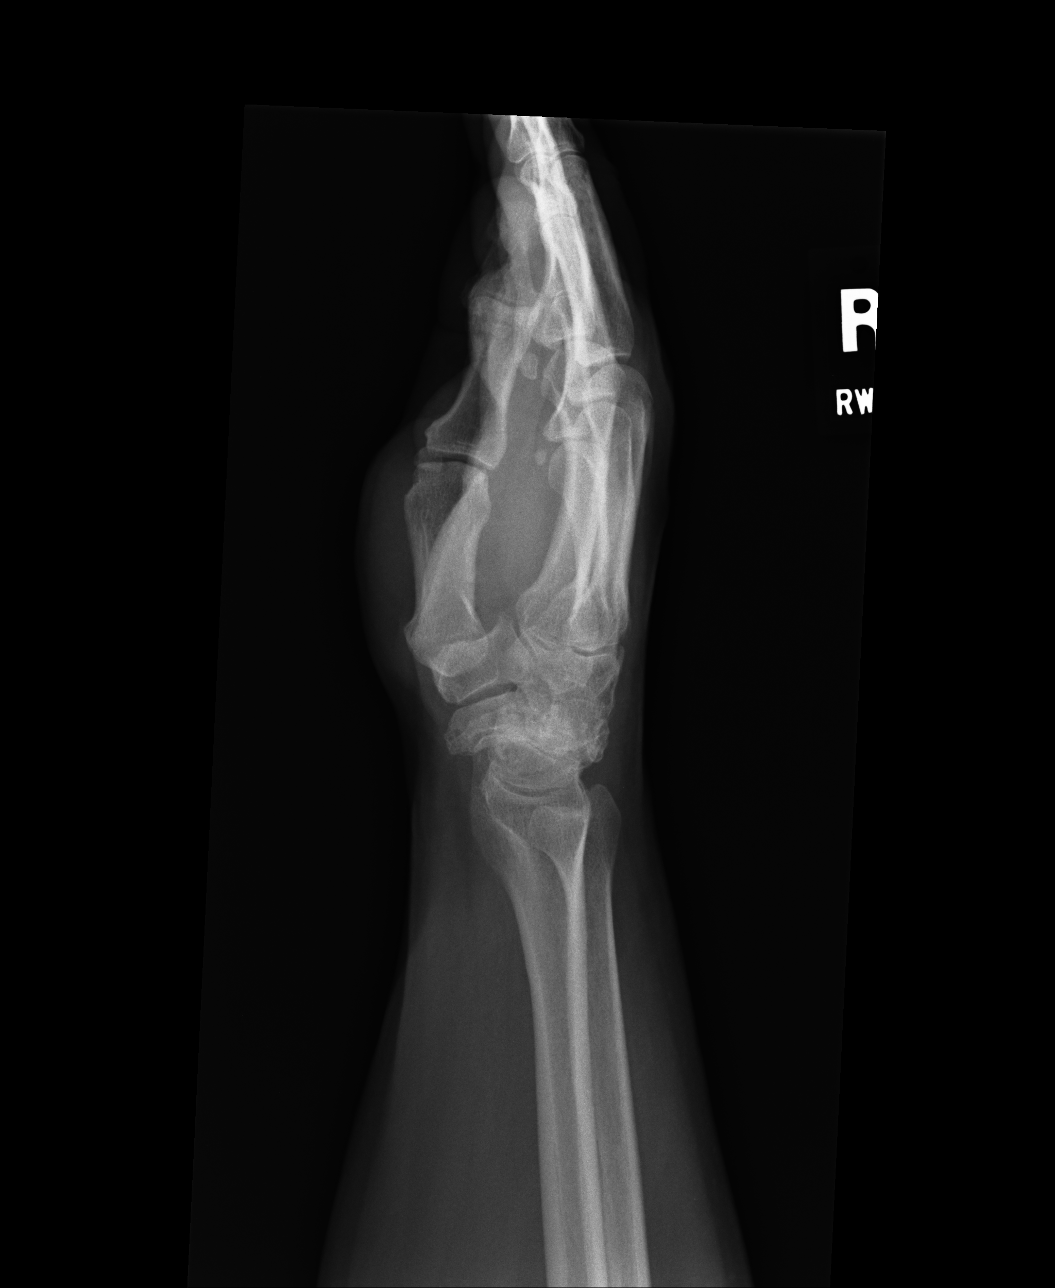

[oblique]
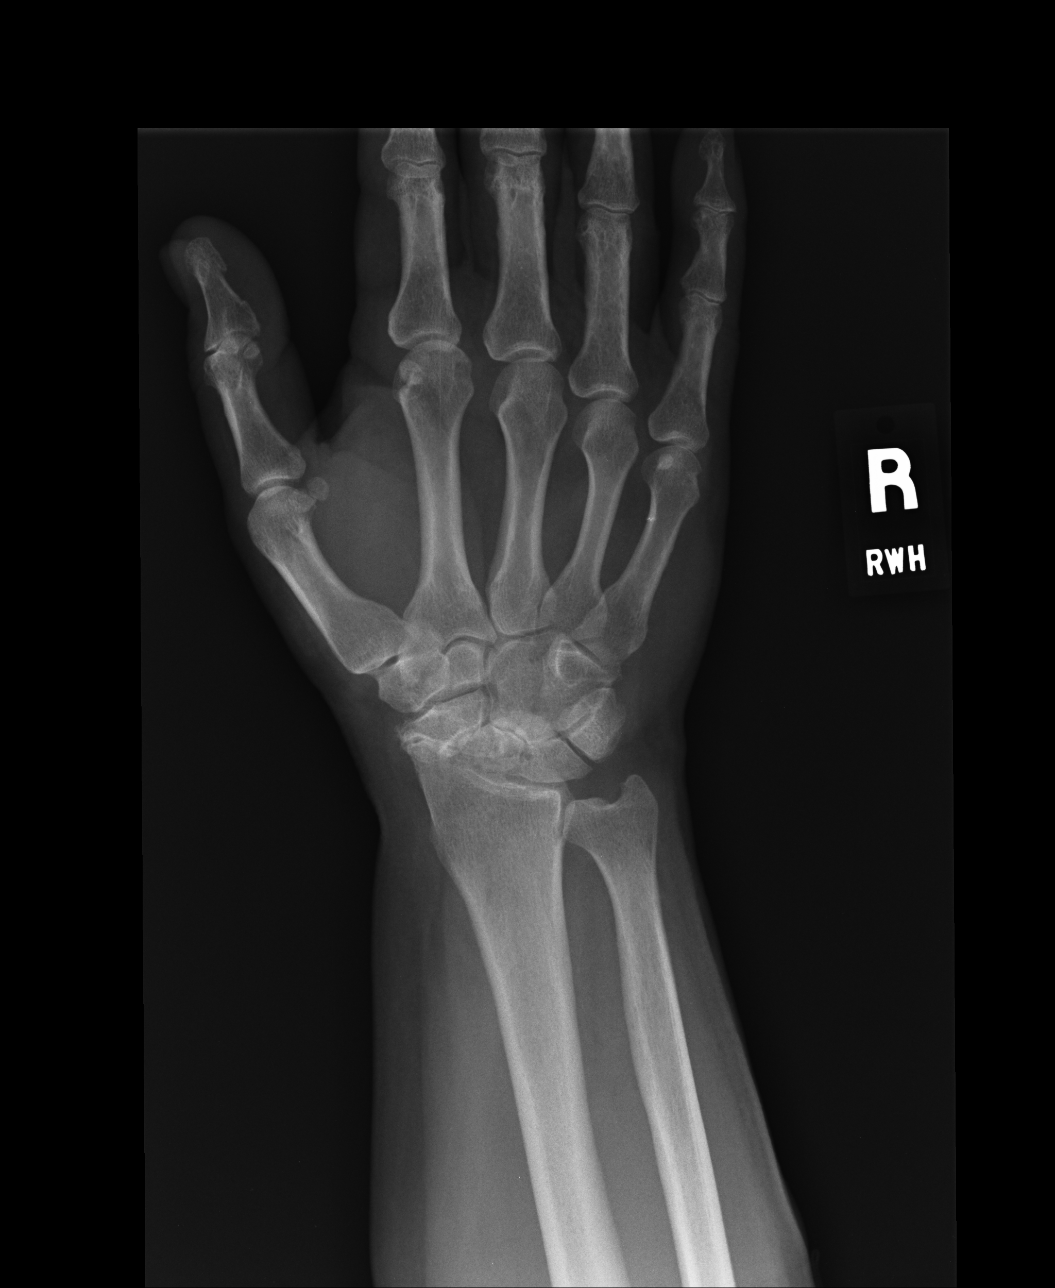

[AP]
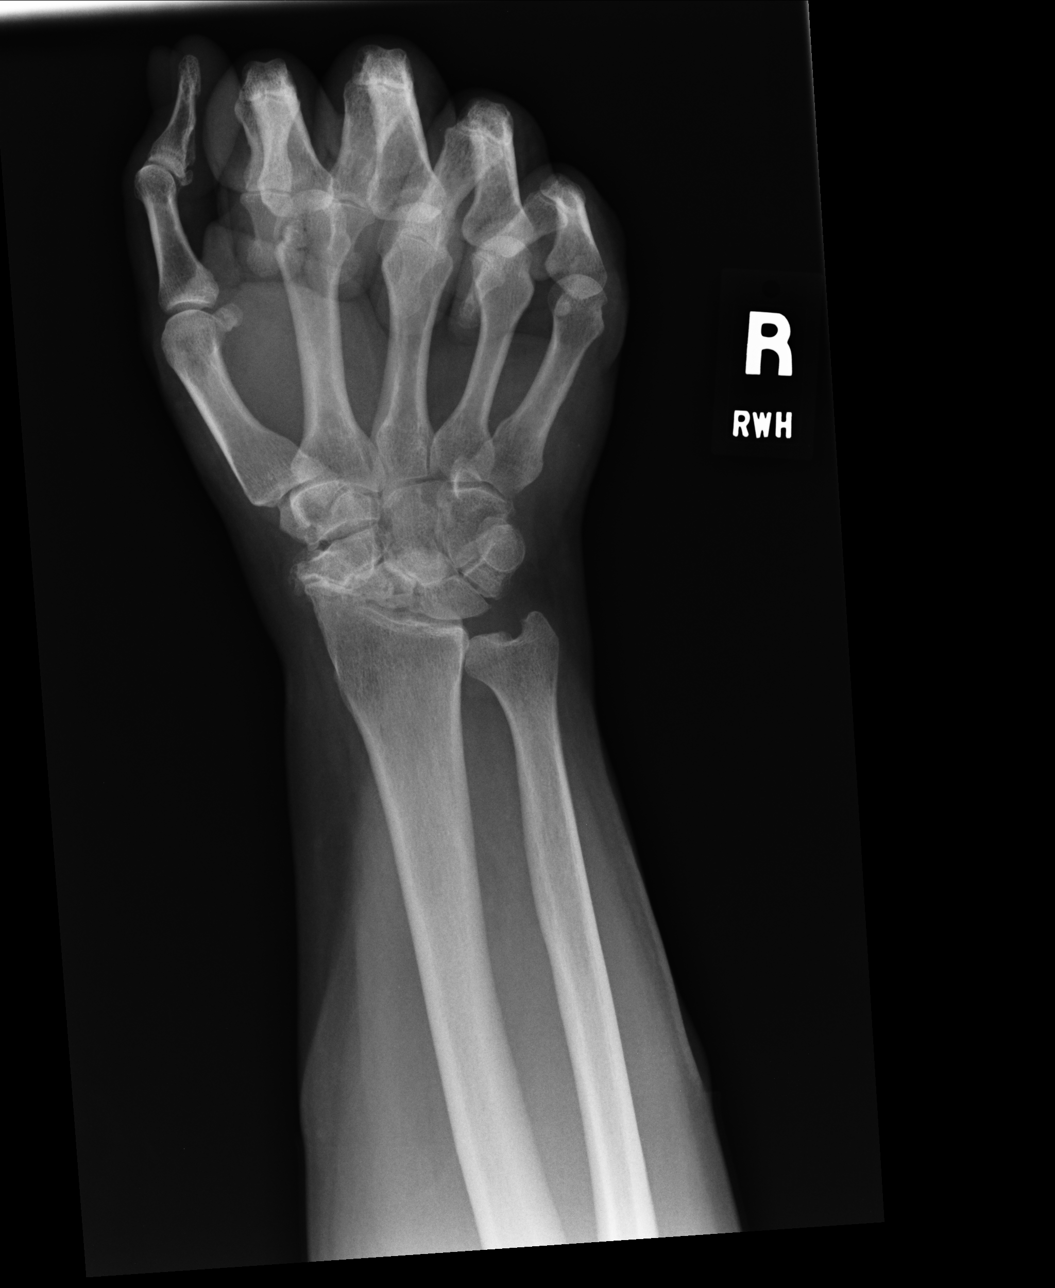

[4 of 4 positions shown; findings below may reference images not displayed]

FINDINGS: There is no evidence of acute fracture, subluxation or dislocation.

Severe degenerative changes of the radiocarpal joint are noted.

No suspicious focal bony lesions are present.

Mild negative ulnar variance is present.
IMPRESSION: No evidence of acute bony abnormality.

Severe radiocarpal joint degenerative changes.

## 2015-09-23 DIAGNOSIS — Z1159 Encounter for screening for other viral diseases: Secondary | ICD-10-CM | POA: Insufficient documentation

## 2015-11-14 ENCOUNTER — Other Ambulatory Visit: Payer: Self-pay | Admitting: Physician Assistant

## 2015-11-14 DIAGNOSIS — E119 Type 2 diabetes mellitus without complications: Secondary | ICD-10-CM

## 2015-11-22 ENCOUNTER — Other Ambulatory Visit: Payer: Self-pay

## 2015-11-22 DIAGNOSIS — I10 Essential (primary) hypertension: Secondary | ICD-10-CM

## 2015-11-22 MED ORDER — CARVEDILOL 6.25 MG PO TABS: 6.2500 mg | ORAL_TABLET | Freq: Every day | ORAL | 0 refills | Status: DC

## 2015-12-23 ENCOUNTER — Other Ambulatory Visit: Payer: Self-pay | Admitting: Physician Assistant

## 2015-12-23 DIAGNOSIS — I10 Essential (primary) hypertension: Secondary | ICD-10-CM

## 2015-12-24 DIAGNOSIS — K219 Gastro-esophageal reflux disease without esophagitis: Secondary | ICD-10-CM | POA: Insufficient documentation

## 2015-12-24 NOTE — Telephone Encounter (Signed)
017 last exam and labs 07/2014 last abn psa Pt. Has switched care to [facility], he will call pharmacy and advise them to switch all rx to his new pmd.  This rx cancelled by me.

## 2016-01-29 ENCOUNTER — Other Ambulatory Visit: Payer: Self-pay | Admitting: Physician Assistant

## 2016-03-24 DIAGNOSIS — E119 Type 2 diabetes mellitus without complications: Secondary | ICD-10-CM | POA: Diagnosis not present

## 2016-03-24 DIAGNOSIS — Z636 Dependent relative needing care at home: Secondary | ICD-10-CM | POA: Diagnosis not present

## 2016-03-24 DIAGNOSIS — Z23 Encounter for immunization: Secondary | ICD-10-CM | POA: Diagnosis not present

## 2016-03-24 DIAGNOSIS — I10 Essential (primary) hypertension: Secondary | ICD-10-CM | POA: Diagnosis not present

## 2016-03-24 DIAGNOSIS — E782 Mixed hyperlipidemia: Secondary | ICD-10-CM | POA: Diagnosis not present

## 2016-05-14 ENCOUNTER — Other Ambulatory Visit: Payer: Self-pay | Admitting: Family Medicine

## 2016-06-23 DIAGNOSIS — E119 Type 2 diabetes mellitus without complications: Secondary | ICD-10-CM | POA: Diagnosis not present

## 2016-06-23 DIAGNOSIS — Z125 Encounter for screening for malignant neoplasm of prostate: Secondary | ICD-10-CM | POA: Diagnosis not present

## 2016-06-23 DIAGNOSIS — I10 Essential (primary) hypertension: Secondary | ICD-10-CM | POA: Diagnosis not present

## 2016-07-07 DIAGNOSIS — H5213 Myopia, bilateral: Secondary | ICD-10-CM | POA: Diagnosis not present

## 2016-07-07 DIAGNOSIS — H401211 Low-tension glaucoma, right eye, mild stage: Secondary | ICD-10-CM | POA: Diagnosis not present

## 2016-07-07 DIAGNOSIS — E119 Type 2 diabetes mellitus without complications: Secondary | ICD-10-CM | POA: Diagnosis not present

## 2016-07-07 DIAGNOSIS — H401222 Low-tension glaucoma, left eye, moderate stage: Secondary | ICD-10-CM | POA: Diagnosis not present

## 2016-07-27 DIAGNOSIS — J00 Acute nasopharyngitis [common cold]: Secondary | ICD-10-CM | POA: Diagnosis not present

## 2016-08-21 DIAGNOSIS — H6123 Impacted cerumen, bilateral: Secondary | ICD-10-CM | POA: Insufficient documentation

## 2016-08-21 DIAGNOSIS — H900 Conductive hearing loss, bilateral: Secondary | ICD-10-CM | POA: Insufficient documentation

## 2016-09-22 DIAGNOSIS — K219 Gastro-esophageal reflux disease without esophagitis: Secondary | ICD-10-CM | POA: Diagnosis not present

## 2016-09-22 DIAGNOSIS — I10 Essential (primary) hypertension: Secondary | ICD-10-CM | POA: Diagnosis not present

## 2016-09-22 DIAGNOSIS — Z636 Dependent relative needing care at home: Secondary | ICD-10-CM | POA: Diagnosis not present

## 2016-09-22 DIAGNOSIS — E782 Mixed hyperlipidemia: Secondary | ICD-10-CM | POA: Diagnosis not present

## 2016-09-22 DIAGNOSIS — E119 Type 2 diabetes mellitus without complications: Secondary | ICD-10-CM | POA: Diagnosis not present

## 2016-09-23 ENCOUNTER — Encounter: Payer: Self-pay | Admitting: Family Medicine

## 2016-12-23 DIAGNOSIS — Z636 Dependent relative needing care at home: Secondary | ICD-10-CM | POA: Diagnosis not present

## 2016-12-23 DIAGNOSIS — I10 Essential (primary) hypertension: Secondary | ICD-10-CM | POA: Diagnosis not present

## 2016-12-23 DIAGNOSIS — E119 Type 2 diabetes mellitus without complications: Secondary | ICD-10-CM | POA: Diagnosis not present

## 2016-12-23 DIAGNOSIS — Z0000 Encounter for general adult medical examination without abnormal findings: Secondary | ICD-10-CM | POA: Diagnosis not present

## 2016-12-23 DIAGNOSIS — E785 Hyperlipidemia, unspecified: Secondary | ICD-10-CM | POA: Diagnosis not present

## 2017-01-05 DIAGNOSIS — H401231 Low-tension glaucoma, bilateral, mild stage: Secondary | ICD-10-CM | POA: Diagnosis not present

## 2017-03-01 DIAGNOSIS — N390 Urinary tract infection, site not specified: Secondary | ICD-10-CM | POA: Diagnosis not present

## 2017-03-30 DIAGNOSIS — I10 Essential (primary) hypertension: Secondary | ICD-10-CM | POA: Diagnosis not present

## 2017-03-30 DIAGNOSIS — E785 Hyperlipidemia, unspecified: Secondary | ICD-10-CM | POA: Diagnosis not present

## 2017-03-30 DIAGNOSIS — F329 Major depressive disorder, single episode, unspecified: Secondary | ICD-10-CM | POA: Diagnosis not present

## 2017-03-30 DIAGNOSIS — E1159 Type 2 diabetes mellitus with other circulatory complications: Secondary | ICD-10-CM | POA: Diagnosis not present

## 2017-03-30 DIAGNOSIS — E1169 Type 2 diabetes mellitus with other specified complication: Secondary | ICD-10-CM | POA: Diagnosis not present

## 2017-03-30 DIAGNOSIS — F419 Anxiety disorder, unspecified: Secondary | ICD-10-CM | POA: Diagnosis not present

## 2017-03-30 DIAGNOSIS — E119 Type 2 diabetes mellitus without complications: Secondary | ICD-10-CM | POA: Diagnosis not present

## 2017-04-20 ENCOUNTER — Emergency Department (HOSPITAL_COMMUNITY)
Admission: EM | Admit: 2017-04-20 | Discharge: 2017-04-21 | Disposition: A | Discharge status: Home / Self Care | Payer: PPO | Attending: Emergency Medicine | Admitting: Emergency Medicine

## 2017-04-20 ENCOUNTER — Encounter (HOSPITAL_COMMUNITY): Payer: Self-pay | Admitting: Emergency Medicine

## 2017-04-20 DIAGNOSIS — Z7984 Long term (current) use of oral hypoglycemic drugs: Secondary | ICD-10-CM | POA: Insufficient documentation

## 2017-04-20 DIAGNOSIS — M7989 Other specified soft tissue disorders: Secondary | ICD-10-CM | POA: Diagnosis not present

## 2017-04-20 DIAGNOSIS — M79673 Pain in unspecified foot: Secondary | ICD-10-CM | POA: Diagnosis present

## 2017-04-20 DIAGNOSIS — M79674 Pain in right toe(s): Secondary | ICD-10-CM | POA: Diagnosis not present

## 2017-04-20 DIAGNOSIS — M79672 Pain in left foot: Secondary | ICD-10-CM

## 2017-04-20 DIAGNOSIS — M79675 Pain in left toe(s): Secondary | ICD-10-CM | POA: Diagnosis not present

## 2017-04-20 DIAGNOSIS — Z79899 Other long term (current) drug therapy: Secondary | ICD-10-CM | POA: Diagnosis not present

## 2017-04-20 DIAGNOSIS — M79671 Pain in right foot: Secondary | ICD-10-CM | POA: Insufficient documentation

## 2017-04-20 DIAGNOSIS — I10 Essential (primary) hypertension: Secondary | ICD-10-CM | POA: Insufficient documentation

## 2017-04-20 DIAGNOSIS — E119 Type 2 diabetes mellitus without complications: Secondary | ICD-10-CM | POA: Insufficient documentation

## 2017-04-20 HISTORY — DX: Type 2 diabetes mellitus without complications: E11.9

## 2017-04-20 MED ORDER — HYDROCODONE-ACETAMINOPHEN 5-325 MG PO TABS
1.0000 | ORAL_TABLET | Freq: Once | ORAL | Status: AC
  Administered 2017-04-21: 1 via ORAL
  Filled 2017-04-20: qty 1

## 2017-04-20 NOTE — ED Triage Notes (Signed)
Patient presents ambulatory c/o bilateral feet pain. Patient states pain has been going on for a while but becoming worse. States he is told he is diabetic but his A1C has decreased. Pain worse with pressure.

## 2017-04-21 ENCOUNTER — Emergency Department (HOSPITAL_COMMUNITY): Payer: PPO

## 2017-04-21 DIAGNOSIS — M7989 Other specified soft tissue disorders: Secondary | ICD-10-CM | POA: Diagnosis not present

## 2017-04-21 DIAGNOSIS — M79674 Pain in right toe(s): Secondary | ICD-10-CM | POA: Diagnosis not present

## 2017-04-21 DIAGNOSIS — M79675 Pain in left toe(s): Secondary | ICD-10-CM | POA: Diagnosis not present

## 2017-04-21 LAB — BASIC METABOLIC PANEL
Anion gap: 12 (ref 5–15)
BUN: 22 mg/dL — AB (ref 6–20)
CHLORIDE: 99 mmol/L — AB (ref 101–111)
CO2: 24 mmol/L (ref 22–32)
Calcium: 9.8 mg/dL (ref 8.9–10.3)
Creatinine, Ser: 0.87 mg/dL (ref 0.61–1.24)
GFR calc Af Amer: >60 mL/min (ref >60)
GFR calc non Af Amer: >60 mL/min (ref >60)
GLUCOSE: 142 mg/dL — AB (ref 65–99)
POTASSIUM: 3.4 mmol/L — AB (ref 3.5–5.1)
Sodium: 135 mmol/L (ref 135–145)

## 2017-04-21 LAB — URIC ACID: Uric Acid, Serum: 4.5 mg/dL (ref 4.4–7.6)

## 2017-04-21 MED ORDER — IBUPROFEN 600 MG PO TABS: 600.0000 mg | ORAL_TABLET | Freq: Four times a day (QID) | ORAL | 0 refills | Status: DC | PRN

## 2017-04-21 MED ORDER — HYDROCODONE-ACETAMINOPHEN 5-325 MG PO TABS: 1.0000 | ORAL_TABLET | ORAL | 0 refills | Status: DC | PRN

## 2017-04-21 NOTE — ED Provider Notes (Signed)
[facility] DEPT Provider Note   CSN: [id number] Arrival date & time: 04/20/17  1540     History   Chief Complaint Chief Complaint  Patient presents with  . Foot Pain    HPI Martin [name] is a 72 y.o. male.  Patient presents with complaint of bilateral foot pain ongoing for "a long time", patient suggests years. He states yesterday it became sharply worse. Pain is located in the great toes. He does not see any swelling or discoloration. He denies pain in the MTP joint of the great toe. He does report the pain extends across the palmar surface. No proximal foot, ankle, or lower leg pain. He has taken Tylenol for pain without relief.    The history is provided by the patient. No language interpreter was used.  Foot Pain     Past Medical History:  Diagnosis Date  . Allergic rhinitis due to allergen   . Anxiety   . BPH (benign prostatic hyperplasia)   . Caregiver stress   . Cataract   . DDD (degenerative disc disease), cervical   . Depressed   . Diabetes mellitus without complication ([place])   . Dyspnea   . Glaucoma    Bil.  . Hyperlipidemia   . Hypertension   . Impaired glucose tolerance 11/20/2010  . Metabolic syndrome   . Myofascial pain dysfunction syndrome     Patient Active Problem List   Diagnosis Date Noted  . Diabetes mellitus ([place]) 04/28/2013  . Acute kidney injury ([place]) 07/08/2012  . Caregiver stress 04/24/2012  . Metabolic syndrome 82/10/1386  . Dyspnea 12/24/2011  . Myofascial pain dysfunction syndrome 07/24/2011  . DDD (degenerative disc disease), cervical 06/24/2011  . Anxiety 11/21/2010  . Depression 11/21/2010  . TRANSAMINASES, SERUM, ELEVATED 04/03/2010  . Allergic rhinitis due to other allergen 07/18/2008  . HYPERLIPIDEMIA 01/11/2008  . HYPERTENSION, BENIGN ESSENTIAL 01/11/2008  . HYPERTROPHY PROSTATE W/UR OBST & OTH LUTS 01/11/2008    Past Surgical History:  Procedure Laterality Date  . BACK SURGERY     29+years ago  . CARPAL TUNNEL RELEASE Right   . SHOULDER ARTHROSCOPY   right shoulder   with bone spurs  . TONSILECTOMY, ADENOIDECTOMY, BILATERAL MYRINGOTOMY AND TUBES  1959       Home Medications    Prior to Admission medications   Medication Sig Start Date End Date Taking? Authorizing Provider  carvedilol (COREG) 6.25 MG tablet Take 1 tablet (6.25 mg total) by mouth at bedtime. 11/22/15  Yes Jaynee Eagles, PA-C  losartan-hydrochlorothiazide (HYZAAR) 100-25 MG tablet TAKE ONE TABLET BY MOUTH ONCE DAILY 08/06/15  Yes Ezekiel Slocumb, PA-C  metFORMIN (GLUCOPHAGE) 500 MG tablet Take 1 tablet (500 mg total) by mouth daily with breakfast. 03/05/15  Yes Ezekiel Slocumb, PA-C  sertraline (ZOLOFT) 50 MG tablet Take 50 mg by mouth every evening. 03/31/17  Yes [provider]  simvastatin (ZOCOR) 40 MG tablet Take 1 tablet (40 mg total) by mouth at bedtime. 08/14/15  Yes Wardell Honour, MD  levocetirizine (XYZAL) 5 MG tablet TAKE ONE TABLET BY MOUTH ONCE DAILY IN THE EVENING Patient not taking: Reported on 04/21/2017 05/16/16   Wardell Honour, MD  oseltamivir (TAMIFLU) 75 MG capsule Take 1 capsule (75 mg total) by mouth 2 (two) times daily. Patient not taking: Reported on 04/21/2017 05/09/15   Wardell Honour, MD  tamsulosin (FLOMAX) 0.4 MG CAPS capsule TAKE ONE CAPSULE BY MOUTH ONCE DAILY *NEED  OFFICE  VISIT* Patient not  taking: Reported on 04/21/2017 12/24/15   Mancel Bale, PA-C    Family History Family History  Problem Relation Age of Onset  . Alcohol abuse Other   . Diabetes Other   . Hyperlipidemia Other   . Kidney disease Other   . Coronary artery disease Other   . Heart disease Mother   . Dementia Mother   . Heart disease Father   . Coronary artery disease Father     Social History Social History   Tobacco Use  . Smoking status: Never Smoker  . Smokeless tobacco: Never Used  Substance Use Topics  . Alcohol use: No  . Drug use: No     Allergies   Enalapril  maleate   Review of Systems Review of Systems  Constitutional: Negative for chills and fever.  Respiratory: Negative.   Cardiovascular: Negative.  Negative for leg swelling.  Musculoskeletal:       See HPI.  Skin: Negative.  Negative for color change.  Neurological: Negative.      Physical Exam Updated Vital Signs BP (!) 149/89 (BP Location: Left Arm)   Pulse 78 Comment: Simultaneous filing. User may not have seen previous data.  Temp 98.2 F (36.8 C) (Oral)   Resp 18   Ht 5\' 7"  (1.702 m)   Wt 80.1 kg (176 lb 9 oz)   SpO2 98% Comment: Simultaneous filing. User may not have seen previous data.  BMI 27.65 kg/m   Physical Exam  Constitutional: He is oriented to person, place, and time. He appears well-developed and well-nourished.  Neck: Normal range of motion.  Pulmonary/Chest: Effort normal.  Musculoskeletal: Normal range of motion.  Lower legs, feet are unremarkable in appearance. No redness. No swelling. There is tenderness over proximal phalanx of bilateral great toes. 1st MTP are nontender.   Neurological: He is alert and oriented to person, place, and time.  Skin: Skin is warm and dry.  Psychiatric: He has a normal mood and affect.     ED Treatments / Results  Labs (all labs ordered are listed, but only abnormal results are displayed) Labs Reviewed  BASIC METABOLIC PANEL  URIC ACID    EKG  EKG Interpretation None       Radiology No results found.  Procedures Procedures (including critical care time)  Medications Ordered in ED Medications  HYDROcodone-acetaminophen (NORCO/VICODIN) 5-325 MG per tablet 1 tablet (not administered)     Initial Impression / Assessment and Plan / ED Course  I have reviewed the triage vital signs and the nursing notes.  Pertinent labs & imaging results that were available during my care of the patient were reviewed by me and considered in my medical decision making (see chart for details).     Patient presents  with longterm bilateral great toe pain, worse x 1-2 days. No history of swelling/redness.   Exam is unremarkable with the exception of mild tenderness to toes. Will perform screening tests.   Labs and imaging are negative. He is examined by Dr. Kathrynn Humble and found appropriate for discharge home. Pain is improved with Norco.   Final Clinical Impressions(s) / ED Diagnoses   Final diagnoses:  None   1. Bilateral foot pain  ED Discharge Orders    None       Charlann Lange, PA-C 04/21/17 [place], [place], MD 04/22/17 [phone]

## 2017-04-21 NOTE — Discharge Instructions (Signed)
Take medications as prescribed and follow up with your doctor for further management of pain in your feet.

## 2017-04-30 ENCOUNTER — Ambulatory Visit: Payer: PPO | Admitting: Podiatry

## 2017-05-10 ENCOUNTER — Encounter: Payer: Self-pay | Admitting: Podiatry

## 2017-05-10 ENCOUNTER — Other Ambulatory Visit: Payer: Self-pay | Admitting: Podiatry

## 2017-05-10 ENCOUNTER — Ambulatory Visit (INDEPENDENT_AMBULATORY_CARE_PROVIDER_SITE_OTHER): Payer: PPO

## 2017-05-10 ENCOUNTER — Ambulatory Visit: Payer: PPO | Admitting: Podiatry

## 2017-05-10 DIAGNOSIS — M722 Plantar fascial fibromatosis: Secondary | ICD-10-CM

## 2017-05-10 DIAGNOSIS — M79671 Pain in right foot: Secondary | ICD-10-CM | POA: Diagnosis not present

## 2017-05-10 MED ORDER — TRIAMCINOLONE ACETONIDE 10 MG/ML IJ SUSP
10.0000 mg | Freq: Once | INTRAMUSCULAR | Status: AC
  Administered 2017-05-10: 10 mg

## 2017-05-10 NOTE — Patient Instructions (Signed)

## 2017-05-11 NOTE — Progress Notes (Signed)
Subjective:   Patient ID: Martin [name], male   DOB: 72 y.o.   MRN: [id number]   HPI Patient presents stating his arches are what hurt him the most and he also gets some pain in his big toe left at times.  States the arch is been hurting him for at least a month and he does not remember specific injury.  Patient does not smoke and likes to be active   Review of Systems  All other systems reviewed and are negative.       Objective:  Physical Exam  Constitutional: He appears well-developed and well-nourished.  Cardiovascular: Intact distal pulses.  Pulmonary/Chest: Effort normal.  Musculoskeletal: Normal range of motion.  Neurological: He is alert.  Skin: Skin is warm.  Nursing note and vitals reviewed.   Neurovascular status was found to be intact muscle strength adequate moderate equinus condition noted bilateral with exquisite discomfort in the mid arch area bilateral with inflammation around the mid tendon complex.  Patient was noted to have good digital perfusion and is well oriented x3 and has mild discomfort left hallux with keratotic plantar lesion of the hallux     Assessment:  Acute plantar fasciitis bilateral mid arch along with lesion left which is secondary to gait process     Plan:  H&P x-ray reviewed and careful mid arch injection administered bilateral 3 mg Kenalog 5 mg Xylocaine and instructed on physical therapy and supportive shoe gear usage.  Reappoint for [place] to recheck again as symptoms indicate and padding applied to the left big toe  X-rays indicate no signs of the arch collapse or stress fracture or advanced arthritis

## 2017-05-14 ENCOUNTER — Ambulatory Visit: Payer: PPO | Admitting: Podiatry

## 2017-05-28 ENCOUNTER — Ambulatory Visit: Payer: PPO | Admitting: Podiatry

## 2017-05-28 ENCOUNTER — Encounter: Payer: Self-pay | Admitting: Podiatry

## 2017-05-28 DIAGNOSIS — M722 Plantar fascial fibromatosis: Secondary | ICD-10-CM | POA: Diagnosis not present

## 2017-05-28 DIAGNOSIS — M79671 Pain in right foot: Secondary | ICD-10-CM

## 2017-05-28 NOTE — Progress Notes (Signed)
Subjective:   Patient ID: Martin [name], male   DOB: 72 y.o.   MRN: [id number]   HPI Patient presents stating the arches are quite a bit better but still concerned about stiffness in the big toe joint bilateral and states that the right foot seems to slip off the brake pedal when he is driving   ROS      Objective:  Physical Exam  Neurovascular status intact with significant diminishment of discomfort in the mid arch area bilateral with no restriction of motion of the first MPJ bilateral and moderately elongated hallux bilateral     Assessment:  Inflammatory fasciitis which is improved with no indications of arthritis or hallux limitus deformity     Plan:  H&P conditions reviewed at great length and I tried to make the patient aware that stiffness is in normal condition and difficult to treat.  I did go ahead today and I applied padding to the left big toe to try to take pressure off it and patient will be seen back as needed

## 2017-06-12 ENCOUNTER — Other Ambulatory Visit: Payer: Self-pay | Admitting: Family Medicine

## 2017-08-02 DIAGNOSIS — E785 Hyperlipidemia, unspecified: Secondary | ICD-10-CM | POA: Diagnosis not present

## 2017-08-02 DIAGNOSIS — Z9181 History of falling: Secondary | ICD-10-CM | POA: Diagnosis not present

## 2017-08-02 DIAGNOSIS — I10 Essential (primary) hypertension: Secondary | ICD-10-CM | POA: Diagnosis not present

## 2017-08-02 DIAGNOSIS — E1169 Type 2 diabetes mellitus with other specified complication: Secondary | ICD-10-CM | POA: Diagnosis not present

## 2017-08-02 DIAGNOSIS — E1159 Type 2 diabetes mellitus with other circulatory complications: Secondary | ICD-10-CM | POA: Diagnosis not present

## 2017-08-02 DIAGNOSIS — E1142 Type 2 diabetes mellitus with diabetic polyneuropathy: Secondary | ICD-10-CM | POA: Diagnosis not present

## 2017-08-02 DIAGNOSIS — R5383 Other fatigue: Secondary | ICD-10-CM | POA: Diagnosis not present

## 2017-08-11 ENCOUNTER — Encounter: Payer: Self-pay | Admitting: Family Medicine

## 2017-08-17 DIAGNOSIS — E1169 Type 2 diabetes mellitus with other specified complication: Secondary | ICD-10-CM | POA: Diagnosis not present

## 2017-08-17 DIAGNOSIS — Z7984 Long term (current) use of oral hypoglycemic drugs: Secondary | ICD-10-CM | POA: Diagnosis not present

## 2017-08-17 DIAGNOSIS — I10 Essential (primary) hypertension: Secondary | ICD-10-CM | POA: Diagnosis not present

## 2017-08-17 DIAGNOSIS — E1142 Type 2 diabetes mellitus with diabetic polyneuropathy: Secondary | ICD-10-CM | POA: Diagnosis not present

## 2017-09-09 ENCOUNTER — Encounter: Payer: Self-pay | Admitting: Podiatry

## 2017-09-09 ENCOUNTER — Ambulatory Visit (INDEPENDENT_AMBULATORY_CARE_PROVIDER_SITE_OTHER): Payer: PPO | Admitting: Podiatry

## 2017-09-09 ENCOUNTER — Ambulatory Visit: Payer: PPO | Admitting: Orthotics

## 2017-09-09 DIAGNOSIS — M79674 Pain in right toe(s): Secondary | ICD-10-CM

## 2017-09-09 DIAGNOSIS — M2041 Other hammer toe(s) (acquired), right foot: Secondary | ICD-10-CM

## 2017-09-09 DIAGNOSIS — M79671 Pain in right foot: Secondary | ICD-10-CM

## 2017-09-09 DIAGNOSIS — B351 Tinea unguium: Secondary | ICD-10-CM

## 2017-09-09 DIAGNOSIS — H6123 Impacted cerumen, bilateral: Secondary | ICD-10-CM

## 2017-09-09 DIAGNOSIS — M722 Plantar fascial fibromatosis: Secondary | ICD-10-CM

## 2017-09-09 DIAGNOSIS — M79675 Pain in left toe(s): Secondary | ICD-10-CM

## 2017-09-09 NOTE — Progress Notes (Signed)
Patient seen today for fitting/measurement of DBS.  Patient presents with hammertoes, FHL, and bunions.  Doctor DPM is REGAL and Doctor PCP is Dentist  Patient chose [facility] [phone]

## 2017-09-10 NOTE — Progress Notes (Signed)
Subjective:   Patient ID: Martin [name], male   DOB: 72 y.o.   MRN: [id number]   HPI Patient presents on referral from family physician who wants him to have diabetic shoes.  Patient is noted to have digital deformity has diminishment of neurological status and has severe nail disease 1-5 both feet with thick incurvated beds that are painful   ROS      Objective:  Physical Exam  Patient has diminished circulatory status bilateral with mild diminishment of sharp dull vibratory and was found to have thick yellow brittle nailbeds 1-5 both feet that are moderately tender and difficult to wear shoe gear with     Assessment:  At risk diabetic with digital deformities and long-term diabetes with mycotic nail infection with pain     Plan:  Recommended diabetic shoes and those are to be made for him he is casted for diabetic shoes and I debrided nailbeds today 1-5 both feet with no iatrogenic bleeding noted

## 2017-10-11 ENCOUNTER — Other Ambulatory Visit: Payer: Self-pay | Admitting: *Deleted

## 2017-10-12 NOTE — Patient Outreach (Addendum)
[facility]) Care Management  10/12/2017  Martin [name] 04/09/1945 [id number]  Referral via MD office: Reason: Patient needs help with transportation to his MD appointments  Telephone call to patient who was advised of reason for call & [facility] care management services. HIPPA verification received.  Patient voices that he and his mother live together and that he is caregiver for mother who has dementia.   Patient voices that he has diabetes and high blood pressure which are both under control. States he manages his own medications and takes medication as directed by his doctors. States last A1c level was 6.5 and last blood pressure 120/70.   States major concern is transportation because he is not driving due to foot problem.  States currently he has no support from family or friends. States [employer] are expensive. Voices that he does minimal cooking. Voices he has no help with his mother.    Patient voices he needs transportation assistance if available and other community resources that might be available such as Meals on Wheels. ? Respite care ? Assistance with his mother.   Patient consents to referral to Clinical Social Worker. States not interested in any other services at this time.   Plan: Refer to Clinical Social Worker for [employer], transportation, respite, PRN caregiver for mother. Telephonic care coordinator signing off.   Sherrin Daisy, RN BSN [place] Management Coordinator [facility] Care Management  [phone]

## 2017-10-15 ENCOUNTER — Other Ambulatory Visit: Payer: Self-pay

## 2017-10-15 NOTE — Patient Outreach (Signed)
[facility]) Care Management  10/15/2017  Martin [name] 07/28/1945 [id number]  Successful outreach to the patient on today's date, HIPAA identifiers confirmed. BSW introduced self and the reason for today's call, indicating BSW received a referral to assist with transportation resources for the patient as well as caregiver options for him mother. The patient agreed these are areas he needs assistance in. However, the patient stated he is currently working with the [place] and has been in process of applying for help for the past 7 months. The patient states "I like to do things one at a time". The patient is unsure when he will receive a determination from the [place] but states it "should be soon". BSW provided the patient with this BSW's contact information. The patient will plan to call this BSW if [place] Management services are desired. BSW to perform a case closure on today's date.  Daneen Schick, BSW, CDP [facility] [phone]

## 2017-11-24 ENCOUNTER — Ambulatory Visit (INDEPENDENT_AMBULATORY_CARE_PROVIDER_SITE_OTHER): Payer: PPO | Admitting: Orthotics

## 2017-11-24 DIAGNOSIS — M2041 Other hammer toe(s) (acquired), right foot: Secondary | ICD-10-CM

## 2017-11-24 DIAGNOSIS — E119 Type 2 diabetes mellitus without complications: Secondary | ICD-10-CM | POA: Diagnosis not present

## 2017-11-24 DIAGNOSIS — M79671 Pain in right foot: Secondary | ICD-10-CM

## 2017-11-24 DIAGNOSIS — M722 Plantar fascial fibromatosis: Secondary | ICD-10-CM | POA: Diagnosis not present

## 2017-11-24 NOTE — Progress Notes (Signed)

## 2017-11-25 ENCOUNTER — Ambulatory Visit: Payer: PPO | Admitting: Orthotics

## 2017-12-28 DIAGNOSIS — Z23 Encounter for immunization: Secondary | ICD-10-CM | POA: Diagnosis not present

## 2017-12-28 DIAGNOSIS — E785 Hyperlipidemia, unspecified: Secondary | ICD-10-CM | POA: Diagnosis not present

## 2017-12-28 DIAGNOSIS — I4510 Unspecified right bundle-branch block: Secondary | ICD-10-CM | POA: Diagnosis not present

## 2017-12-28 DIAGNOSIS — I10 Essential (primary) hypertension: Secondary | ICD-10-CM | POA: Diagnosis not present

## 2017-12-28 DIAGNOSIS — E1159 Type 2 diabetes mellitus with other circulatory complications: Secondary | ICD-10-CM | POA: Diagnosis not present

## 2017-12-28 DIAGNOSIS — E1169 Type 2 diabetes mellitus with other specified complication: Secondary | ICD-10-CM | POA: Diagnosis not present

## 2018-02-08 DIAGNOSIS — H401122 Primary open-angle glaucoma, left eye, moderate stage: Secondary | ICD-10-CM | POA: Diagnosis not present

## 2018-02-08 DIAGNOSIS — E119 Type 2 diabetes mellitus without complications: Secondary | ICD-10-CM | POA: Diagnosis not present

## 2018-02-08 DIAGNOSIS — H25011 Cortical age-related cataract, right eye: Secondary | ICD-10-CM | POA: Diagnosis not present

## 2018-02-08 DIAGNOSIS — H52203 Unspecified astigmatism, bilateral: Secondary | ICD-10-CM | POA: Diagnosis not present

## 2018-02-09 ENCOUNTER — Other Ambulatory Visit: Payer: Self-pay | Admitting: Pharmacist

## 2018-02-09 NOTE — Patient Outreach (Signed)
[facility]) Care Management  02/09/2018  Martin [name] Aug 03, 1945 [id number]   Call patient's [facility] to confirm that he has a refill of simvastatin available. Confirm that patient has a 90 day supply prescription on hold. Request that the prescription be refilled. Team member informs me that they will contact the patient to schedule a time to deliver it to him.  Will close pharmacy episode at this time.  Harlow Asa, PharmD, [place] Management [phone]

## 2018-02-09 NOTE — Patient Outreach (Signed)
[facility]) Care Management  02/09/2018  UNKNOWN SCHLEYER 01/23/1946 [id number]   Incoming call from Merlene Morse in response to the [facility] Medication Adherence Campaign. Speak with patient. HIPAA identifiers verified and verbal consent received.  Mr. [place] reports that he takes his simvastatin 40 mg once daily as directed. Denies any missed doses or barriers to adherence. Patient reviews his bottle and states that he has about a week of medication remaining. Counsel patient on the importance of medication adherence and strategies to help with adherence.  Patient denies any further medication questions/concerns at this time.   PLAN  1) Will call patient's [facility] to confirm that he has a refill of simvastatin available.  2) Will then plan to close pharmacy episode.  Harlow Asa, PharmD, [place] Management [phone]

## 2018-04-20 ENCOUNTER — Other Ambulatory Visit: Payer: Self-pay | Admitting: Pharmacist

## 2018-04-20 NOTE — Patient Outreach (Signed)
Martin [name]  04/20/2018  Martin [name] 1945-08-01 [id number]  Patient failed statin, ACEi/ARB, and diabetes adherence measures in 2019. Screening patient to determine if any pharmacy intervention would benefit medication adherence for 2020.  Per [facility] records, patient has not filled metformin since 09/2017. Contacted the [place] that the medication was last filled at; they noted that the patient had since transferred all medications to [facility]; they noted that patient has never filled metformin with them. Patient has had 90 day supplies of losartan-HCTZ and simvastatin filled within the last 90 days.   Contacted patient to discuss adherence; mailbox was not set up. Will try again in 1-2 business days.   Catie Darnelle Maffucci, PharmD, [place] PGY2 Ambulatory Care Pharmacy Resident, [facility] Phone: [phone]

## 2018-04-22 ENCOUNTER — Other Ambulatory Visit: Payer: Self-pay | Admitting: Pharmacist

## 2018-04-22 NOTE — Patient Outreach (Addendum)
[facility]) Care Management  04/22/2018  Martin [name] Oct 13, 1945 [id number]   Patient failed statin, ACEi/ARB, and diabetes adherence measures in 2019. Screening patient to determine if any pharmacy intervention would benefit medication adherence for 2020.   Contacted patient to discuss adherence; he noted that he was getting ready to take his mother out, and requested I call back later this afternoon.   Catie Darnelle Maffucci, PharmD, [place] PGY2 Ambulatory Care Pharmacy Resident, [place] [facility] Phone: [phone]

## 2018-04-22 NOTE — Patient Outreach (Addendum)
[facility]) Care Management  04/22/2018  Martin [name] [birth year] [id number]  Patient failed statin, ACEi/ARB, and diabetes adherence measures in 2019. Contacted patient to determine if any pharmacy intervention would benefit medication adherence for 2020.  Contacted patient; reviewed medications. He realized that he had not been taking metformin, he is not sure how long he has been without metformin therapy. He notes that his losartan-HCTZ was split to separate losartan and HCTZ, and he has both of these medications at home. He confirms that he is also talking simvastatin. Of note, he has not been taking sertraline. Recommended he speak with Dr. Coletta Memos about restarting.   He does admit that with taking care of his mother, he is a bit overwhelmed by ensure his health remains a priority. We discussed pill boxes, but he notes that he doesn't think that would help him, he just forgets to submit for refills. We discussed that [facility] has a med sync/automated refill program; he noted that would be a huge help for him.   [place] on the patient's behalf and asked that they add him to the med sync program. They confirmed that they would do this and get in touch with the patient about refills.   Patient declined any further questions or concerns. He has my contact information for future reference. Will route note to PCP Dr. Coletta Memos for [facility].   Catie Darnelle Maffucci, PharmD, [place] PGY2 Ambulatory Care Pharmacy Resident, [place] [facility] Phone: [phone]

## 2018-09-26 DIAGNOSIS — E119 Type 2 diabetes mellitus without complications: Secondary | ICD-10-CM | POA: Diagnosis not present

## 2018-09-26 DIAGNOSIS — H401131 Primary open-angle glaucoma, bilateral, mild stage: Secondary | ICD-10-CM | POA: Diagnosis not present

## 2018-09-26 DIAGNOSIS — H25011 Cortical age-related cataract, right eye: Secondary | ICD-10-CM | POA: Diagnosis not present

## 2018-09-26 DIAGNOSIS — H5213 Myopia, bilateral: Secondary | ICD-10-CM | POA: Diagnosis not present

## 2018-10-27 DIAGNOSIS — H25811 Combined forms of age-related cataract, right eye: Secondary | ICD-10-CM | POA: Diagnosis not present

## 2018-10-27 DIAGNOSIS — H52221 Regular astigmatism, right eye: Secondary | ICD-10-CM | POA: Diagnosis not present

## 2018-10-27 DIAGNOSIS — H2181 Floppy iris syndrome: Secondary | ICD-10-CM | POA: Diagnosis not present

## 2018-10-27 DIAGNOSIS — H2511 Age-related nuclear cataract, right eye: Secondary | ICD-10-CM | POA: Diagnosis not present

## 2018-10-27 DIAGNOSIS — H25011 Cortical age-related cataract, right eye: Secondary | ICD-10-CM | POA: Diagnosis not present

## 2018-11-10 DIAGNOSIS — H52222 Regular astigmatism, left eye: Secondary | ICD-10-CM | POA: Diagnosis not present

## 2018-11-10 DIAGNOSIS — H2512 Age-related nuclear cataract, left eye: Secondary | ICD-10-CM | POA: Diagnosis not present

## 2018-11-10 DIAGNOSIS — H25812 Combined forms of age-related cataract, left eye: Secondary | ICD-10-CM | POA: Diagnosis not present

## 2019-01-18 DIAGNOSIS — F329 Major depressive disorder, single episode, unspecified: Secondary | ICD-10-CM | POA: Diagnosis not present

## 2019-01-18 DIAGNOSIS — F419 Anxiety disorder, unspecified: Secondary | ICD-10-CM | POA: Diagnosis not present

## 2019-02-08 DIAGNOSIS — Z636 Dependent relative needing care at home: Secondary | ICD-10-CM | POA: Diagnosis not present

## 2019-02-08 DIAGNOSIS — Z0000 Encounter for general adult medical examination without abnormal findings: Secondary | ICD-10-CM | POA: Diagnosis not present

## 2019-02-08 DIAGNOSIS — E785 Hyperlipidemia, unspecified: Secondary | ICD-10-CM | POA: Diagnosis not present

## 2019-02-08 DIAGNOSIS — E1159 Type 2 diabetes mellitus with other circulatory complications: Secondary | ICD-10-CM | POA: Diagnosis not present

## 2019-02-08 DIAGNOSIS — E1042 Type 1 diabetes mellitus with diabetic polyneuropathy: Secondary | ICD-10-CM | POA: Diagnosis not present

## 2019-02-08 DIAGNOSIS — E1169 Type 2 diabetes mellitus with other specified complication: Secondary | ICD-10-CM | POA: Diagnosis not present

## 2019-02-08 DIAGNOSIS — I10 Essential (primary) hypertension: Secondary | ICD-10-CM | POA: Diagnosis not present

## 2019-04-19 DIAGNOSIS — H401131 Primary open-angle glaucoma, bilateral, mild stage: Secondary | ICD-10-CM | POA: Diagnosis not present

## 2019-05-01 DIAGNOSIS — E1159 Type 2 diabetes mellitus with other circulatory complications: Secondary | ICD-10-CM | POA: Diagnosis not present

## 2019-05-01 DIAGNOSIS — E1169 Type 2 diabetes mellitus with other specified complication: Secondary | ICD-10-CM | POA: Diagnosis not present

## 2019-05-01 DIAGNOSIS — E785 Hyperlipidemia, unspecified: Secondary | ICD-10-CM | POA: Diagnosis not present

## 2019-05-01 DIAGNOSIS — F329 Major depressive disorder, single episode, unspecified: Secondary | ICD-10-CM | POA: Diagnosis not present

## 2019-05-01 DIAGNOSIS — E119 Type 2 diabetes mellitus without complications: Secondary | ICD-10-CM | POA: Diagnosis not present

## 2019-05-01 DIAGNOSIS — I10 Essential (primary) hypertension: Secondary | ICD-10-CM | POA: Diagnosis not present

## 2019-05-12 ENCOUNTER — Ambulatory Visit: Payer: PPO | Attending: Internal Medicine

## 2019-05-12 ENCOUNTER — Ambulatory Visit: Payer: PPO

## 2019-05-12 DIAGNOSIS — Z23 Encounter for immunization: Secondary | ICD-10-CM | POA: Insufficient documentation

## 2019-05-12 NOTE — Progress Notes (Signed)
   Covid-19 Vaccination Clinic  Name:  Martin [name]    MRN: [id number] DOB: Apr 26, 1945  05/12/2019  [name] was observed post Covid-19 immunization for 15 minutes without incidence. He was provided with Vaccine Information Sheet and instruction to access the V-Safe system.   [name] was instructed to call 911 with any severe reactions post vaccine: [name] Kitchen Difficulty breathing  . Swelling of your face and throat  . A fast heartbeat  . A bad rash all over your body  . Dizziness and weakness    Immunizations Administered    Name Date Dose VIS Date Route   Pfizer COVID-19 Vaccine 05/12/2019  2:38 PM 0.3 mL 02/24/2019 Intramuscular   Manufacturer: [place]   Lot: [id number]   [place]: [id number]

## 2019-06-06 DIAGNOSIS — I10 Essential (primary) hypertension: Secondary | ICD-10-CM | POA: Diagnosis not present

## 2019-06-06 DIAGNOSIS — F329 Major depressive disorder, single episode, unspecified: Secondary | ICD-10-CM | POA: Diagnosis not present

## 2019-06-06 DIAGNOSIS — Z8249 Family history of ischemic heart disease and other diseases of the circulatory system: Secondary | ICD-10-CM | POA: Diagnosis not present

## 2019-06-06 DIAGNOSIS — F419 Anxiety disorder, unspecified: Secondary | ICD-10-CM | POA: Diagnosis not present

## 2019-06-06 DIAGNOSIS — E782 Mixed hyperlipidemia: Secondary | ICD-10-CM | POA: Diagnosis not present

## 2019-06-06 DIAGNOSIS — E785 Hyperlipidemia, unspecified: Secondary | ICD-10-CM | POA: Diagnosis not present

## 2019-06-06 DIAGNOSIS — E119 Type 2 diabetes mellitus without complications: Secondary | ICD-10-CM | POA: Diagnosis not present

## 2019-06-07 ENCOUNTER — Ambulatory Visit: Payer: PPO | Attending: Internal Medicine

## 2019-06-07 DIAGNOSIS — Z23 Encounter for immunization: Secondary | ICD-10-CM

## 2019-06-07 NOTE — Progress Notes (Signed)
   Covid-19 Vaccination Clinic  Name:  Martin [name]    MRN: [id number] DOB: 04/10/1945  06/07/2019  Martin [name] was observed post Covid-19 immunization for 15 minutes without incident. He was provided with Vaccine Information Sheet and instruction to access the V-Safe system.   Martin [name] was instructed to call 911 with any severe reactions post vaccine: [name] Kitchen Difficulty breathing  . Swelling of face and throat  . A fast heartbeat  . A bad rash all over body  . Dizziness and weakness   Immunizations Administered    Name Date Dose VIS Date Route   Pfizer COVID-19 Vaccine 06/07/2019  4:05 PM 0.3 mL 02/24/2019 Intramuscular   Manufacturer: [place]   Lot: [id number]   [place]: [id number]

## 2019-06-22 DIAGNOSIS — B351 Tinea unguium: Secondary | ICD-10-CM | POA: Diagnosis not present

## 2019-06-22 DIAGNOSIS — M79672 Pain in left foot: Secondary | ICD-10-CM | POA: Diagnosis not present

## 2019-06-22 DIAGNOSIS — I739 Peripheral vascular disease, unspecified: Secondary | ICD-10-CM | POA: Diagnosis not present

## 2019-06-22 DIAGNOSIS — M79671 Pain in right foot: Secondary | ICD-10-CM | POA: Diagnosis not present

## 2019-06-22 DIAGNOSIS — G629 Polyneuropathy, unspecified: Secondary | ICD-10-CM | POA: Diagnosis not present

## 2019-06-22 DIAGNOSIS — M792 Neuralgia and neuritis, unspecified: Secondary | ICD-10-CM | POA: Diagnosis not present

## 2019-06-22 DIAGNOSIS — E1151 Type 2 diabetes mellitus with diabetic peripheral angiopathy without gangrene: Secondary | ICD-10-CM | POA: Diagnosis not present

## 2019-06-28 DIAGNOSIS — B351 Tinea unguium: Secondary | ICD-10-CM | POA: Diagnosis not present

## 2019-07-13 DIAGNOSIS — B351 Tinea unguium: Secondary | ICD-10-CM | POA: Diagnosis not present

## 2019-07-13 DIAGNOSIS — E1151 Type 2 diabetes mellitus with diabetic peripheral angiopathy without gangrene: Secondary | ICD-10-CM | POA: Diagnosis not present

## 2019-07-13 DIAGNOSIS — M792 Neuralgia and neuritis, unspecified: Secondary | ICD-10-CM | POA: Diagnosis not present

## 2019-07-13 DIAGNOSIS — I739 Peripheral vascular disease, unspecified: Secondary | ICD-10-CM | POA: Diagnosis not present

## 2019-08-04 DIAGNOSIS — I739 Peripheral vascular disease, unspecified: Secondary | ICD-10-CM | POA: Diagnosis not present

## 2019-08-04 DIAGNOSIS — I70223 Atherosclerosis of native arteries of extremities with rest pain, bilateral legs: Secondary | ICD-10-CM | POA: Diagnosis not present

## 2019-08-04 DIAGNOSIS — I70203 Unspecified atherosclerosis of native arteries of extremities, bilateral legs: Secondary | ICD-10-CM | POA: Diagnosis not present

## 2019-08-10 DIAGNOSIS — I152 Hypertension secondary to endocrine disorders: Secondary | ICD-10-CM | POA: Diagnosis not present

## 2019-08-10 DIAGNOSIS — E1159 Type 2 diabetes mellitus with other circulatory complications: Secondary | ICD-10-CM | POA: Diagnosis not present

## 2019-08-10 DIAGNOSIS — E1169 Type 2 diabetes mellitus with other specified complication: Secondary | ICD-10-CM | POA: Diagnosis not present

## 2019-08-10 DIAGNOSIS — E785 Hyperlipidemia, unspecified: Secondary | ICD-10-CM | POA: Diagnosis not present

## 2019-10-24 DIAGNOSIS — H43813 Vitreous degeneration, bilateral: Secondary | ICD-10-CM | POA: Diagnosis not present

## 2019-10-24 DIAGNOSIS — H401131 Primary open-angle glaucoma, bilateral, mild stage: Secondary | ICD-10-CM | POA: Diagnosis not present

## 2019-11-08 DIAGNOSIS — E785 Hyperlipidemia, unspecified: Secondary | ICD-10-CM | POA: Diagnosis not present

## 2019-11-08 DIAGNOSIS — E1159 Type 2 diabetes mellitus with other circulatory complications: Secondary | ICD-10-CM | POA: Diagnosis not present

## 2019-11-08 DIAGNOSIS — F329 Major depressive disorder, single episode, unspecified: Secondary | ICD-10-CM | POA: Diagnosis not present

## 2019-11-08 DIAGNOSIS — E1169 Type 2 diabetes mellitus with other specified complication: Secondary | ICD-10-CM | POA: Diagnosis not present

## 2019-11-08 DIAGNOSIS — I152 Hypertension secondary to endocrine disorders: Secondary | ICD-10-CM | POA: Diagnosis not present

## 2019-11-08 DIAGNOSIS — F419 Anxiety disorder, unspecified: Secondary | ICD-10-CM | POA: Diagnosis not present

## 2019-12-10 IMAGING — CR DG TOE GREAT 2+V*R*
3 series · 3 of 3 positions shown · non-contrast
Comparison: None.

CLINICAL DATA: Chronic bilateral great toe pain.

EXAM:
RIGHT GREAT TOE

[x toes ap right]
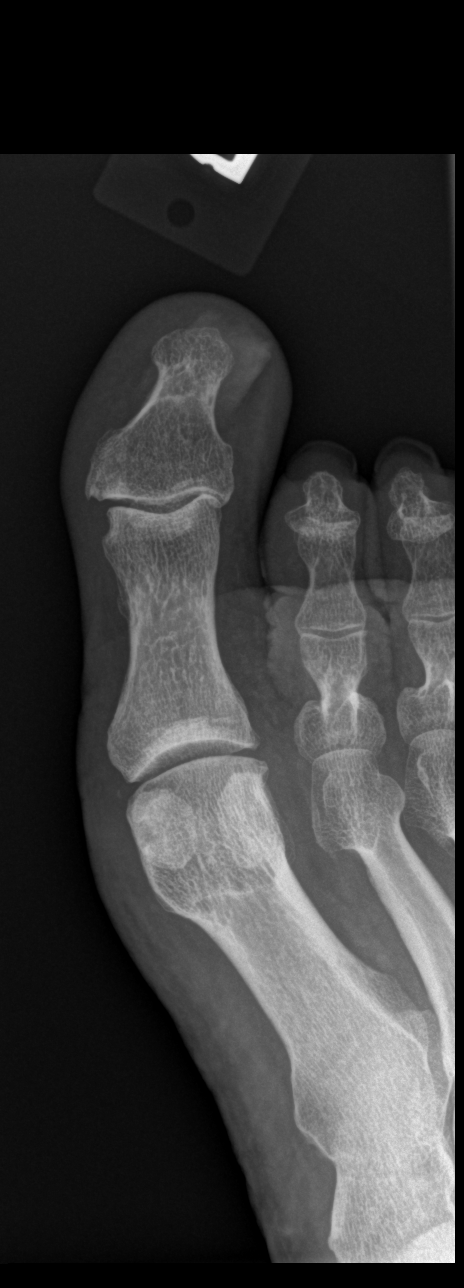

[x toes lat right (1 of 2)]
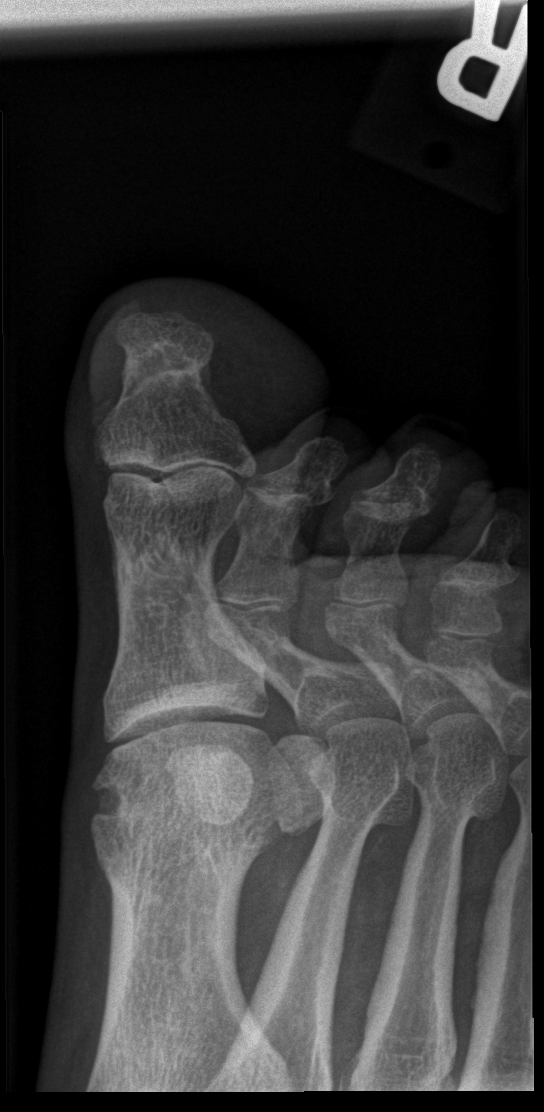

[x toes lat right (2 of 2)]
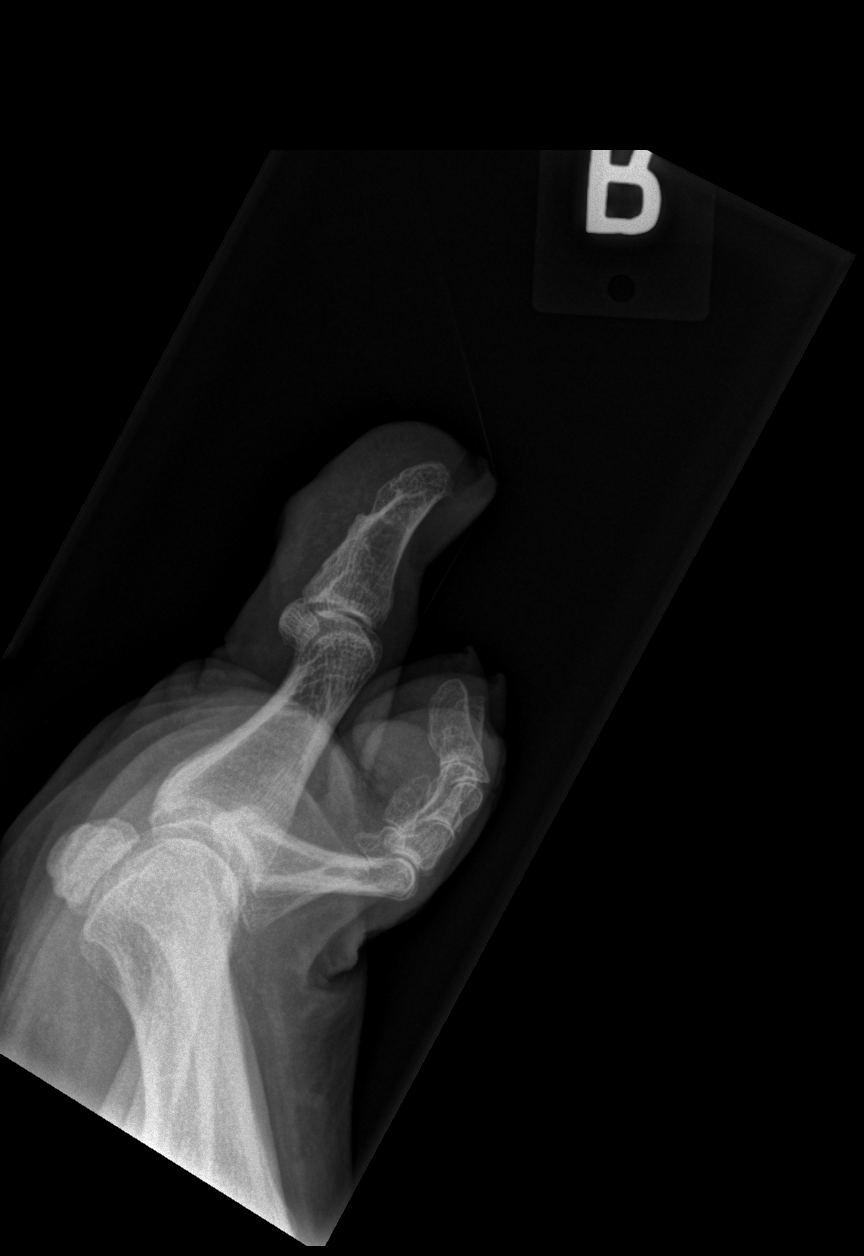

[3 of 3 positions shown; findings below may reference images not displayed]

FINDINGS: There is no evidence of fracture or dislocation. Minimal sclerosis
is noted at the first interphalangeal joint. Visualized joint spaces
are otherwise preserved. There is no evidence of osseous erosion.
Soft tissue swelling is noted about the great toe.
IMPRESSION: No evidence of fracture or dislocation.  No osseous erosions seen.

## 2020-02-05 DIAGNOSIS — E1169 Type 2 diabetes mellitus with other specified complication: Secondary | ICD-10-CM | POA: Diagnosis not present

## 2020-02-05 DIAGNOSIS — Z23 Encounter for immunization: Secondary | ICD-10-CM | POA: Diagnosis not present

## 2020-02-05 DIAGNOSIS — E785 Hyperlipidemia, unspecified: Secondary | ICD-10-CM | POA: Diagnosis not present

## 2020-02-05 DIAGNOSIS — F324 Major depressive disorder, single episode, in partial remission: Secondary | ICD-10-CM | POA: Diagnosis not present

## 2020-02-05 DIAGNOSIS — E1159 Type 2 diabetes mellitus with other circulatory complications: Secondary | ICD-10-CM | POA: Diagnosis not present

## 2020-02-05 DIAGNOSIS — E119 Type 2 diabetes mellitus without complications: Secondary | ICD-10-CM | POA: Diagnosis not present

## 2020-02-05 DIAGNOSIS — I152 Hypertension secondary to endocrine disorders: Secondary | ICD-10-CM | POA: Diagnosis not present

## 2020-04-30 DIAGNOSIS — H401131 Primary open-angle glaucoma, bilateral, mild stage: Secondary | ICD-10-CM | POA: Diagnosis not present

## 2020-05-07 DIAGNOSIS — F324 Major depressive disorder, single episode, in partial remission: Secondary | ICD-10-CM | POA: Diagnosis not present

## 2020-05-07 DIAGNOSIS — E1169 Type 2 diabetes mellitus with other specified complication: Secondary | ICD-10-CM | POA: Diagnosis not present

## 2020-05-07 DIAGNOSIS — Z9181 History of falling: Secondary | ICD-10-CM | POA: Diagnosis not present

## 2020-05-07 DIAGNOSIS — E785 Hyperlipidemia, unspecified: Secondary | ICD-10-CM | POA: Diagnosis not present

## 2020-05-07 DIAGNOSIS — E1159 Type 2 diabetes mellitus with other circulatory complications: Secondary | ICD-10-CM | POA: Diagnosis not present

## 2020-05-07 DIAGNOSIS — I152 Hypertension secondary to endocrine disorders: Secondary | ICD-10-CM | POA: Diagnosis not present

## 2020-07-03 ENCOUNTER — Encounter: Payer: Self-pay | Admitting: Gastroenterology

## 2020-07-15 ENCOUNTER — Encounter: Payer: Self-pay | Admitting: Gastroenterology

## 2020-08-18 LAB — COLOGUARD: COLOGUARD: NEGATIVE

## 2020-08-27 ENCOUNTER — Telehealth: Payer: Self-pay | Admitting: *Deleted

## 2020-08-27 NOTE — Telephone Encounter (Signed)
Pt called back and cancelled PV and colonoscopy.  He states he had a cologuard and it was negative

## 2020-08-27 NOTE — Telephone Encounter (Signed)
1110- attempted to reach pt- he is a NOS to his PV.  LMOM to call back before 5:00 pm to reschedule PV

## 2020-09-10 ENCOUNTER — Encounter: Payer: PPO | Admitting: Gastroenterology

## 2021-03-07 ENCOUNTER — Emergency Department (HOSPITAL_COMMUNITY)
Admission: EM | Admit: 2021-03-07 | Discharge: 2021-03-08 | Disposition: A | Discharge status: Home / Self Care | Payer: Medicare HMO | Attending: Emergency Medicine | Admitting: Emergency Medicine

## 2021-03-07 ENCOUNTER — Emergency Department (HOSPITAL_COMMUNITY): Payer: Medicare HMO

## 2021-03-07 ENCOUNTER — Encounter (HOSPITAL_COMMUNITY): Payer: Self-pay

## 2021-03-07 ENCOUNTER — Other Ambulatory Visit: Payer: Self-pay

## 2021-03-07 DIAGNOSIS — Z7984 Long term (current) use of oral hypoglycemic drugs: Secondary | ICD-10-CM | POA: Insufficient documentation

## 2021-03-07 DIAGNOSIS — Z79899 Other long term (current) drug therapy: Secondary | ICD-10-CM | POA: Insufficient documentation

## 2021-03-07 DIAGNOSIS — Z20822 Contact with and (suspected) exposure to covid-19: Secondary | ICD-10-CM | POA: Insufficient documentation

## 2021-03-07 DIAGNOSIS — I10 Essential (primary) hypertension: Secondary | ICD-10-CM | POA: Insufficient documentation

## 2021-03-07 DIAGNOSIS — R55 Syncope and collapse: Secondary | ICD-10-CM | POA: Insufficient documentation

## 2021-03-07 DIAGNOSIS — R42 Dizziness and giddiness: Secondary | ICD-10-CM | POA: Insufficient documentation

## 2021-03-07 DIAGNOSIS — R531 Weakness: Secondary | ICD-10-CM | POA: Insufficient documentation

## 2021-03-07 DIAGNOSIS — E119 Type 2 diabetes mellitus without complications: Secondary | ICD-10-CM | POA: Diagnosis not present

## 2021-03-07 LAB — CBC WITH DIFFERENTIAL/PLATELET
Abs Immature Granulocytes: 0.03 10*3/uL (ref 0.00–0.07)
Basophils Absolute: 0.1 10*3/uL (ref 0.0–0.1)
Basophils Relative: 1 %
Eosinophils Absolute: 0.3 10*3/uL (ref 0.0–0.5)
Eosinophils Relative: 4 %
HCT: 44.8 % (ref 39.0–52.0)
Hemoglobin: 15.1 g/dL (ref 13.0–17.0)
Immature Granulocytes: 0 %
Lymphocytes Relative: 19 %
Lymphs Abs: 1.7 10*3/uL (ref 0.7–4.0)
MCH: 31.3 pg (ref 26.0–34.0)
MCHC: 33.7 g/dL (ref 30.0–36.0)
MCV: 92.9 fL (ref 80.0–100.0)
Monocytes Absolute: 0.8 10*3/uL (ref 0.1–1.0)
Monocytes Relative: 9 %
Neutro Abs: 5.9 10*3/uL (ref 1.7–7.7)
Neutrophils Relative %: 67 %
Platelets: 239 10*3/uL (ref 150–400)
RBC: 4.82 MIL/uL (ref 4.22–5.81)
RDW: 13 % (ref 11.5–15.5)
WBC: 8.8 10*3/uL (ref 4.0–10.5)
nRBC: 0 % (ref 0.0–0.2)

## 2021-03-07 LAB — COMPREHENSIVE METABOLIC PANEL
ALT: 22 U/L (ref 0–44)
AST: 20 U/L (ref 15–41)
Albumin: 4.4 g/dL (ref 3.5–5.0)
Alkaline Phosphatase: 49 U/L (ref 38–126)
Anion gap: 11 (ref 5–15)
BUN: 15 mg/dL (ref 8–23)
CO2: 24 mmol/L (ref 22–32)
Calcium: 9.4 mg/dL (ref 8.9–10.3)
Chloride: 102 mmol/L (ref 98–111)
Creatinine, Ser: 0.77 mg/dL (ref 0.61–1.24)
GFR, Estimated: >60 mL/min (ref >60)
Glucose, Bld: 120 mg/dL — ABNORMAL HIGH (ref 70–99)
Potassium: 3.9 mmol/L (ref 3.5–5.1)
Sodium: 137 mmol/L (ref 135–145)
Total Bilirubin: 0.9 mg/dL (ref 0.3–1.2)
Total Protein: 8.2 g/dL — ABNORMAL HIGH (ref 6.5–8.1)

## 2021-03-07 NOTE — ED Provider Notes (Signed)
Emergency Medicine Provider Triage Evaluation Note  Martin [name] , a 75 y.o. male  was evaluated in triage.  Pt complains of generalized weakness over the last 2 to 3 days.  Patient reports that he is also been having dizziness when standing over the last 2 to 3 days as well.  Patient states that today he suffered a fall.  Patient is unsure what caused his fall.  Patient believes he hit his head.  Patient believes that he had a loss of consciousness for a few seconds after this fall.  Patient denies taking any blood thinners.  Review of Systems  Positive: Generalized weakness, dizziness Negative: Chest pain, palpitations, shortness of breath, fever, chills  Physical Exam  BP (!) 175/92 (BP Location: Right Arm)    Pulse 63    Temp 97.9 F (36.6 C) (Oral)    Resp 16    Ht 5\' 7"  (1.702 m)    Wt 83.5 kg    SpO2 99%    BMI 28.82 kg/m  Gen:   Awake, no distress   Resp:  Normal effort, speaks in full sentences without difficulty MSK:   Moves extremities without difficulty  Other:  No midline tenderness to cervical, thoracic, or lumbar spine.  Patient moves all limbs equally without difficulty.  No dysarthria.  Medical Decision Making  Medically screening exam initiated at 5:29 PM.  Appropriate orders placed.  Merlene Morse was informed that the remainder of the evaluation will be completed by another provider, this initial triage assessment does not replace that evaluation, and the importance of remaining in the ED until their evaluation is complete.  Due to patient's fall and advanced age will obtain noncontrast head and cervical spine CT.   Loni Beckwith, PA-C 03/07/21 1731    Daleen Bo, MD 03/10/21 [phone]

## 2021-03-07 NOTE — ED Triage Notes (Signed)
Per EMS-coming from an UC-states he has been having weakness with ambulation-ortho static changes negative, having lethargy 2-3 days-intermittent dizziness-denies pain-had a fall this am-unsure if he lost consciousness-has not taken meds in a couple of days

## 2021-03-07 NOTE — ED Notes (Signed)
Patient refused Covid and flu swab. Patient states he had a negative test today while at [facility].

## 2021-03-07 NOTE — ED Notes (Signed)
[phone] can pickup pt when he is ready for d/c

## 2021-03-08 LAB — URINALYSIS, ROUTINE W REFLEX MICROSCOPIC
Bilirubin Urine: NEGATIVE
Glucose, UA: >1000 mg/dL — AB
Hgb urine dipstick: NEGATIVE
Ketones, ur: NEGATIVE mg/dL
Nitrite: NEGATIVE
Protein, ur: NEGATIVE mg/dL
Specific Gravity, Urine: 1.02 (ref 1.005–1.030)
pH: 6 (ref 5.0–8.0)

## 2021-03-08 LAB — RESP PANEL BY RT-PCR (FLU A&B, COVID) ARPGX2
Influenza A by PCR: NEGATIVE
Influenza B by PCR: NEGATIVE
SARS Coronavirus 2 by RT PCR: NEGATIVE

## 2021-03-08 LAB — CBG MONITORING, ED: Glucose-Capillary: 226 mg/dL — ABNORMAL HIGH (ref 70–99)

## 2021-03-08 MED ORDER — SODIUM CHLORIDE 0.9 % IV BOLUS
500.0000 mL | Freq: Once | INTRAVENOUS | Status: AC
  Administered 2021-03-08: 01:00:00 500 mL via INTRAVENOUS

## 2021-03-08 NOTE — ED Provider Notes (Signed)
[facility] DEPT Provider Note   CSN: [id number] Arrival date & time: 03/07/21  1710     History No chief complaint on file.   Martin [name] is a 75 y.o. male.  The history is provided by the patient and medical records.  Martin [name] is a 75 y.o. male who presents to the Emergency Department complaining of weakness.  This morning when he got up to use the bathroom (urinate) he became dizzy.  He tried to sit and ended up falling to the floor.  He did not sustain any significant injury when he fell but he did strike his head.  He is unsure if he passed out or not.  It took him a few minutes to get up after he fell and he had to reach for the doorknob and use that to get up.  No recent medication changes.  He had not eaten the day of the fall but this was when he had just woken up.  He lives alone.  No fevers, chest pain, difficulty breathing, nausea, vomiting, abdominal pain, diarrhea. He normally ambulates with a walker and has been having difficulty walking for a couple of years.    Past Medical History:  Diagnosis Date   Allergic rhinitis due to allergen    Anxiety    BPH (benign prostatic hyperplasia)    Caregiver stress    Cataract    DDD (degenerative disc disease), cervical    Depressed    Diabetes mellitus without complication (HCC)    Dyspnea    Glaucoma    Bil.   Hyperlipidemia    Hypertension    Impaired glucose tolerance 08/16/6946   Metabolic syndrome    Myofascial pain dysfunction syndrome     Patient Active Problem List   Diagnosis Date Noted   Bilateral impacted cerumen 08/21/2016   Conductive hearing loss, bilateral 08/21/2016   Gastroesophageal reflux disease without esophagitis 12/24/2015   Need for hepatitis C screening test 09/23/2015   Diabetes mellitus ([place]) 04/28/2013   Acute kidney injury ([place]) 07/08/2012   Caregiver stress 54/62/7035   Metabolic syndrome 00/93/8182   Dyspnea 12/24/2011   Myofascial pain  dysfunction syndrome 07/24/2011   DDD (degenerative disc disease), cervical 06/24/2011   Anxiety 11/21/2010   Depression 11/21/2010   TRANSAMINASES, SERUM, ELEVATED 04/03/2010   Allergic rhinitis due to other allergen 07/18/2008   HYPERLIPIDEMIA 01/11/2008   HYPERTENSION, BENIGN ESSENTIAL 01/11/2008   HYPERTROPHY PROSTATE W/UR OBST & OTH LUTS 01/11/2008    Past Surgical History:  Procedure Laterality Date   BACK SURGERY     29+years ago   CARPAL TUNNEL RELEASE Right    SHOULDER ARTHROSCOPY   right shoulder   with bone spurs   TONSILECTOMY, ADENOIDECTOMY, BILATERAL MYRINGOTOMY AND TUBES  1959       Family History  Problem Relation Age of Onset   Alcohol abuse Other    Diabetes Other    Hyperlipidemia Other    Kidney disease Other    Coronary artery disease Other    Heart disease Mother    Dementia Mother    Heart disease Father    Coronary artery disease Father     Social History   Tobacco Use   Smoking status: Never   Smokeless tobacco: Never  Vaping Use   Vaping Use: Never used  Substance Use Topics   Alcohol use: No   Drug use: No    Home Medications Prior to Admission medications   Medication Sig Start  Date End Date Taking? Authorizing Provider  carvedilol (COREG) 6.25 MG tablet Take 1 tablet (6.25 mg total) by mouth at bedtime. 11/22/15   Jaynee Eagles, PA-C  hydrochlorothiazide (HYDRODIURIL) 25 MG tablet Take 25 mg by mouth daily.    [provider]  HYDROcodone-acetaminophen (NORCO/VICODIN) 5-325 MG tablet Take 1 tablet by mouth every 4 (four) hours as needed. Patient not taking: Reported on 04/22/2018 04/21/17   Charlann Lange, PA-C  ibuprofen (ADVIL,MOTRIN) 600 MG tablet Take 1 tablet (600 mg total) by mouth every 6 (six) hours as needed. Patient not taking: Reported on 04/22/2018 04/21/17   Charlann Lange, PA-C  levocetirizine (XYZAL) 5 MG tablet TAKE ONE TABLET BY MOUTH ONCE DAILY IN THE EVENING 05/16/16   Wardell Honour, MD  losartan (COZAAR) 100 MG  tablet Take 100 mg by mouth daily.     [provider]  metFORMIN (GLUCOPHAGE) 500 MG tablet Take 1 tablet (500 mg total) by mouth daily with breakfast. Patient not taking: Reported on 04/22/2018 03/05/15   Ezekiel Slocumb, PA-C  sertraline (ZOLOFT) 50 MG tablet Take 50 mg by mouth every evening. 03/31/17   [provider]  simvastatin (ZOCOR) 40 MG tablet Take 1 tablet (40 mg total) by mouth at bedtime. 08/14/15   Wardell Honour, MD  tamsulosin (FLOMAX) 0.4 MG CAPS capsule TAKE ONE CAPSULE BY MOUTH ONCE DAILY *NEED  OFFICE  VISIT* 12/24/15   Gale Journey, Damaris Hippo, PA-C    Allergies    Enalapril maleate  Review of Systems   Review of Systems  All other systems reviewed and are negative.  Physical Exam Updated Vital Signs BP (!) 162/82    Pulse 68    Temp 98.1 F (36.7 C) (Oral)    Resp 16    Ht 5\' 7"  (1.702 m)    Wt 83.5 kg    SpO2 97%    BMI 28.82 kg/m   Physical Exam Vitals and nursing note reviewed.  Constitutional:      Appearance: He is well-developed.  HENT:     Head: Normocephalic and atraumatic.  Cardiovascular:     Rate and Rhythm: Normal rate and regular rhythm.     Heart sounds: No murmur heard. Pulmonary:     Effort: Pulmonary effort is normal. No respiratory distress.     Breath sounds: Normal breath sounds.  Abdominal:     Palpations: Abdomen is soft.     Tenderness: There is no abdominal tenderness. There is no guarding or rebound.  Musculoskeletal:        General: No tenderness.  Skin:    General: Skin is warm and dry.  Neurological:     Mental Status: He is alert and oriented to person, place, and time.     Comments: No asymmetry of facial movements.  5 out of 5 strength in all 4 extremities with sensation to light touch intact in all 4 extremities.  Visual fields grossly intact.  No ataxia on finger-to-nose bilaterally.  Stooped and shuffling gait.  Psychiatric:        Behavior: Behavior normal.    ED Results / Procedures / Treatments    Labs (all labs ordered are listed, but only abnormal results are displayed) Labs Reviewed  URINALYSIS, ROUTINE W REFLEX MICROSCOPIC - Abnormal; Notable for the following components:      Result Value   Glucose, UA >1,000 (*)    Leukocytes,Ua SMALL (*)    Bacteria, UA RARE (*)    All other components within normal  limits  COMPREHENSIVE METABOLIC PANEL - Abnormal; Notable for the following components:   Glucose, Bld 120 (*)    Total Protein 8.2 (*)    All other components within normal limits  CBG MONITORING, ED - Abnormal; Notable for the following components:   Glucose-Capillary 226 (*)    All other components within normal limits  RESP PANEL BY RT-PCR (FLU A&B, COVID) [id number]  URINE CULTURE  CBC WITH DIFFERENTIAL/PLATELET  CBG MONITORING, ED    EKG EKG Interpretation  Date/Time:  Friday March 07 2021 17:21:02 EST Ventricular Rate:  62 PR Interval:  166 QRS Duration: 144 QT Interval:  437 QTC Calculation: 444 R Axis:   96 Text Interpretation: Sinus rhythm Right bundle branch block since last tracing no significant change Confirmed by Daleen Bo [phone]) on 03/07/2021 10:06:12 PM  Radiology DG Chest 2 View  Result Date: 03/07/2021 CLINICAL DATA:  cough EXAM: CHEST - 2 VIEW COMPARISON:  Chest x-ray 05/09/2015 FINDINGS: The heart and mediastinal contours are unchanged. No focal consolidation. No pulmonary edema. No pleural effusion. No pneumothorax. No acute osseous abnormality. IMPRESSION: No active cardiopulmonary disease. Electronically Signed   By: Iven Finn M.D.   On: 03/07/2021 18:22   CT Head Wo Contrast  Result Date: 03/07/2021 CLINICAL DATA:  Fall, weakness and dizziness past few days, neck trauma EXAM: CT HEAD WITHOUT CONTRAST CT CERVICAL SPINE WITHOUT CONTRAST TECHNIQUE: Multidetector CT imaging of the head and cervical spine was performed following the standard protocol without intravenous contrast. Multiplanar CT image reconstructions of the cervical  spine were also generated. COMPARISON:  CT head 07/07/2012 FINDINGS: CT HEAD FINDINGS Brain: Generalized atrophy. Normal ventricular morphology. No midline shift or mass effect. Small vessel chronic ischemic changes of deep cerebral white matter. No intracranial hemorrhage, mass lesion, evidence of acute infarction, or extra-axial fluid collection. Vascular: Atherosclerotic calcification of internal carotid arteries at skull base. No hyperdense vessels. Skull: Intact Sinuses/Orbits: Clear Other: N/A CT CERVICAL SPINE FINDINGS Alignment: Normal Skull base and vertebrae: Osseous mineralization normal. Skull base intact. Vertebral body heights maintained. Disc space narrowing and endplate spur formation C4-C5 through C6-C7. Calcified posterior longitudinal ligament at C5-C6. Multilevel facet degenerative changes. No fracture, subluxation, or bone destruction. Soft tissues and spinal canal: Prevertebral soft tissues normal thickness. Scattered atherosclerotic calcifications of vertebral and carotid systems. Disc levels:  No specific abnormalities Upper chest: Lung apices clear Other: N/A IMPRESSION: Atrophy with small vessel chronic ischemic changes of deep cerebral white matter. No acute intracranial abnormalities. Multilevel degenerative disc and facet disease changes of the cervical spine. No acute cervical spine abnormalities. Electronically Signed   By: Lavonia Dana M.D.   On: 03/07/2021 18:04   CT Cervical Spine Wo Contrast  Result Date: 03/07/2021 CLINICAL DATA:  Fall, weakness and dizziness past few days, neck trauma EXAM: CT HEAD WITHOUT CONTRAST CT CERVICAL SPINE WITHOUT CONTRAST TECHNIQUE: Multidetector CT imaging of the head and cervical spine was performed following the standard protocol without intravenous contrast. Multiplanar CT image reconstructions of the cervical spine were also generated. COMPARISON:  CT head 07/07/2012 FINDINGS: CT HEAD FINDINGS Brain: Generalized atrophy. Normal ventricular  morphology. No midline shift or mass effect. Small vessel chronic ischemic changes of deep cerebral white matter. No intracranial hemorrhage, mass lesion, evidence of acute infarction, or extra-axial fluid collection. Vascular: Atherosclerotic calcification of internal carotid arteries at skull base. No hyperdense vessels. Skull: Intact Sinuses/Orbits: Clear Other: N/A CT CERVICAL SPINE FINDINGS Alignment: Normal Skull base and vertebrae: Osseous mineralization normal. Skull base intact. Vertebral  body heights maintained. Disc space narrowing and endplate spur formation C4-C5 through C6-C7. Calcified posterior longitudinal ligament at C5-C6. Multilevel facet degenerative changes. No fracture, subluxation, or bone destruction. Soft tissues and spinal canal: Prevertebral soft tissues normal thickness. Scattered atherosclerotic calcifications of vertebral and carotid systems. Disc levels:  No specific abnormalities Upper chest: Lung apices clear Other: N/A IMPRESSION: Atrophy with small vessel chronic ischemic changes of deep cerebral white matter. No acute intracranial abnormalities. Multilevel degenerative disc and facet disease changes of the cervical spine. No acute cervical spine abnormalities. Electronically Signed   By: Lavonia Dana M.D.   On: 03/07/2021 18:04    Procedures Procedures   Medications Ordered in ED Medications  sodium chloride 0.9 % bolus 500 mL (0 mLs Intravenous Stopped 03/08/21 0150)    ED Course  I have reviewed the triage vital signs and the nursing notes.  Pertinent labs & imaging results that were available during my care of the patient were reviewed by me and considered in my medical decision making (see chart for details).    MDM Rules/Calculators/A&P                         Patient here for evaluation following a fall earlier today with possible syncopal events.  He did have significant weakness when he tried to get up off the floor.  On evaluation he has no focal  neurologic deficits.  He does have a still shuffling and stooped gait but he states that this is at his baseline.  UA is not consistent with UTI.  He was seen in urgent care today and had negative testing for COVID and influenza.  Presentation is not consistent with arrhythmia, PE, subarachnoid hemorrhage.  Discussed with patient syncopal event, possibly in setting of urination and vagal episode.  Plan to discharge home with outpatient follow-up and return precautions.  Discussed sitting while using the commode.    Final Clinical Impression(s) / ED Diagnoses Final diagnoses:  Micturition syncope    Rx / DC Orders ED Discharge Orders     None        Quintella Reichert, MD 03/08/21 [phone]

## 2021-03-10 LAB — URINE CULTURE: Culture: NO GROWTH

## 2021-09-29 ENCOUNTER — Emergency Department (HOSPITAL_COMMUNITY): Payer: Medicare HMO

## 2021-09-29 ENCOUNTER — Other Ambulatory Visit: Payer: Self-pay

## 2021-09-29 ENCOUNTER — Emergency Department (HOSPITAL_COMMUNITY)
Admission: EM | Admit: 2021-09-29 | Discharge: 2021-10-02 | Disposition: A | Discharge status: Home / Self Care | Payer: Medicare HMO | Attending: Emergency Medicine | Admitting: Emergency Medicine

## 2021-09-29 DIAGNOSIS — H538 Other visual disturbances: Secondary | ICD-10-CM | POA: Insufficient documentation

## 2021-09-29 DIAGNOSIS — I10 Essential (primary) hypertension: Secondary | ICD-10-CM | POA: Diagnosis not present

## 2021-09-29 DIAGNOSIS — E1165 Type 2 diabetes mellitus with hyperglycemia: Secondary | ICD-10-CM | POA: Insufficient documentation

## 2021-09-29 DIAGNOSIS — Z833 Family history of diabetes mellitus: Secondary | ICD-10-CM | POA: Insufficient documentation

## 2021-09-29 DIAGNOSIS — R531 Weakness: Secondary | ICD-10-CM | POA: Diagnosis present

## 2021-09-29 DIAGNOSIS — Z7984 Long term (current) use of oral hypoglycemic drugs: Secondary | ICD-10-CM | POA: Insufficient documentation

## 2021-09-29 DIAGNOSIS — R519 Headache, unspecified: Secondary | ICD-10-CM | POA: Diagnosis not present

## 2021-09-29 DIAGNOSIS — Z79899 Other long term (current) drug therapy: Secondary | ICD-10-CM | POA: Diagnosis not present

## 2021-09-29 DIAGNOSIS — I6782 Cerebral ischemia: Secondary | ICD-10-CM | POA: Diagnosis not present

## 2021-09-29 DIAGNOSIS — Z8249 Family history of ischemic heart disease and other diseases of the circulatory system: Secondary | ICD-10-CM | POA: Diagnosis not present

## 2021-09-29 DIAGNOSIS — I700 Atherosclerosis of aorta: Secondary | ICD-10-CM | POA: Diagnosis not present

## 2021-09-29 DIAGNOSIS — Y900 Blood alcohol level of less than 20 mg/100 ml: Secondary | ICD-10-CM | POA: Insufficient documentation

## 2021-09-29 DIAGNOSIS — R2689 Other abnormalities of gait and mobility: Secondary | ICD-10-CM | POA: Insufficient documentation

## 2021-09-29 DIAGNOSIS — Z20822 Contact with and (suspected) exposure to covid-19: Secondary | ICD-10-CM | POA: Diagnosis not present

## 2021-09-29 DIAGNOSIS — F1110 Opioid abuse, uncomplicated: Secondary | ICD-10-CM | POA: Insufficient documentation

## 2021-09-29 LAB — COMPREHENSIVE METABOLIC PANEL
ALT: 18 U/L (ref 0–44)
AST: 16 U/L (ref 15–41)
Albumin: 3.5 g/dL (ref 3.5–5.0)
Alkaline Phosphatase: 54 U/L (ref 38–126)
Anion gap: 12 (ref 5–15)
BUN: 12 mg/dL (ref 8–23)
CO2: 21 mmol/L — ABNORMAL LOW (ref 22–32)
Calcium: 9 mg/dL (ref 8.9–10.3)
Chloride: 101 mmol/L (ref 98–111)
Creatinine, Ser: 0.97 mg/dL (ref 0.61–1.24)
GFR, Estimated: >60 mL/min (ref >60)
Glucose, Bld: 223 mg/dL — ABNORMAL HIGH (ref 70–99)
Potassium: 3.7 mmol/L (ref 3.5–5.1)
Sodium: 134 mmol/L — ABNORMAL LOW (ref 135–145)
Total Bilirubin: 0.6 mg/dL (ref 0.3–1.2)
Total Protein: 7.8 g/dL (ref 6.5–8.1)

## 2021-09-29 LAB — PROTIME-INR
INR: 1.2 (ref 0.8–1.2)
Prothrombin Time: 14.7 seconds (ref 11.4–15.2)

## 2021-09-29 LAB — CBC
HCT: 39.6 % (ref 39.0–52.0)
Hemoglobin: 13.4 g/dL (ref 13.0–17.0)
MCH: 31.5 pg (ref 26.0–34.0)
MCHC: 33.8 g/dL (ref 30.0–36.0)
MCV: 93.2 fL (ref 80.0–100.0)
Platelets: 303 10*3/uL (ref 150–400)
RBC: 4.25 MIL/uL (ref 4.22–5.81)
RDW: 12.7 % (ref 11.5–15.5)
WBC: 12.7 10*3/uL — ABNORMAL HIGH (ref 4.0–10.5)
nRBC: 0 % (ref 0.0–0.2)

## 2021-09-29 LAB — I-STAT CHEM 8, ED
BUN: 12 mg/dL (ref 8–23)
Calcium, Ion: 1.1 mmol/L — ABNORMAL LOW (ref 1.15–1.40)
Chloride: 101 mmol/L (ref 98–111)
Creatinine, Ser: 0.7 mg/dL (ref 0.61–1.24)
Glucose, Bld: 232 mg/dL — ABNORMAL HIGH (ref 70–99)
HCT: 40 % (ref 39.0–52.0)
Hemoglobin: 13.6 g/dL (ref 13.0–17.0)
Potassium: 3.7 mmol/L (ref 3.5–5.1)
Sodium: 135 mmol/L (ref 135–145)
TCO2: 20 mmol/L — ABNORMAL LOW (ref 22–32)

## 2021-09-29 LAB — DIFFERENTIAL
Abs Immature Granulocytes: 0.05 10*3/uL (ref 0.00–0.07)
Basophils Absolute: 0.1 10*3/uL (ref 0.0–0.1)
Basophils Relative: 1 %
Eosinophils Absolute: 0.1 10*3/uL (ref 0.0–0.5)
Eosinophils Relative: 1 %
Immature Granulocytes: 0 %
Lymphocytes Relative: 11 %
Lymphs Abs: 1.4 10*3/uL (ref 0.7–4.0)
Monocytes Absolute: 1.2 10*3/uL — ABNORMAL HIGH (ref 0.1–1.0)
Monocytes Relative: 10 %
Neutro Abs: 9.8 10*3/uL — ABNORMAL HIGH (ref 1.7–7.7)
Neutrophils Relative %: 77 %

## 2021-09-29 LAB — APTT: aPTT: 29 seconds (ref 24–36)

## 2021-09-29 LAB — RESP PANEL BY RT-PCR (FLU A&B, COVID) ARPGX2
Influenza A by PCR: NEGATIVE
Influenza B by PCR: NEGATIVE
SARS Coronavirus 2 by RT PCR: NEGATIVE

## 2021-09-29 LAB — CBG MONITORING, ED: Glucose-Capillary: 206 mg/dL — ABNORMAL HIGH (ref 70–99)

## 2021-09-29 LAB — ETHANOL: Alcohol, Ethyl (B): <10 mg/dL (ref <10)

## 2021-09-29 MED ORDER — TETRACAINE HCL 0.5 % OP SOLN
2.0000 [drp] | Freq: Once | OPHTHALMIC | Status: AC
  Administered 2021-09-29: 2 [drp] via OPHTHALMIC
  Filled 2021-09-29: qty 4

## 2021-09-29 MED ORDER — LORAZEPAM 2 MG/ML IJ SOLN
INTRAMUSCULAR | Status: AC
  Filled 2021-09-29: qty 1

## 2021-09-29 MED ORDER — LORAZEPAM 2 MG/ML IJ SOLN
1.0000 mg | Freq: Once | INTRAMUSCULAR | Status: AC
Start: 2021-09-29 — End: 2021-09-29
  Administered 2021-09-29: 1 mg via INTRAVENOUS

## 2021-09-29 MED ORDER — DIPHENHYDRAMINE HCL 50 MG/ML IJ SOLN
25.0000 mg | Freq: Once | INTRAMUSCULAR | Status: AC
  Administered 2021-09-29: 25 mg via INTRAVENOUS
  Filled 2021-09-29: qty 1

## 2021-09-29 MED ORDER — IOHEXOL 350 MG/ML SOLN
65.0000 mL | Freq: Once | INTRAVENOUS | Status: AC | PRN
  Administered 2021-09-29: 65 mL via INTRAVENOUS

## 2021-09-29 MED ORDER — METOCLOPRAMIDE HCL 5 MG/ML IJ SOLN
10.0000 mg | Freq: Once | INTRAMUSCULAR | Status: AC
  Administered 2021-09-29: 10 mg via INTRAVENOUS
  Filled 2021-09-29: qty 2

## 2021-09-29 MED ORDER — MORPHINE SULFATE (PF) 4 MG/ML IV SOLN
4.0000 mg | Freq: Once | INTRAVENOUS | Status: AC
  Administered 2021-09-29: 4 mg via INTRAVENOUS
  Filled 2021-09-29: qty 1

## 2021-09-29 NOTE — ED Provider Notes (Signed)
[facility]PART[facility] Provider Note   CSN: [id number] Arrival date & time: 09/29/21  2106  An emergency department physician performed an initial assessment on this suspected stroke patient at 2122.  History  Chief Complaint  Patient presents with   Weakness    Martin [name] is a 76 y.o. male hx of HTN, glaucoma here with headache, right blurry vision, weakness.  Patient states that Martin has been having right eye pain for the last 5 days. Patient has history of glaucoma.  Patient has been using eyedrops with minimal relief.  Patient states that around 7 PM today Martin said sudden onset of headache and had trouble getting up.  Patient has worsening headache at that time.  Code stroke was activated.   The history is provided by the patient.       Home Medications Prior to Admission medications   Medication Sig Start Date End Date Taking? Authorizing Provider  carvedilol (COREG) 6.25 MG tablet Take 1 tablet (6.25 mg total) by mouth at bedtime. 11/22/15   Jaynee Eagles, PA-C  hydrochlorothiazide (HYDRODIURIL) 25 MG tablet Take 25 mg by mouth daily.    [provider]  HYDROcodone-acetaminophen (NORCO/VICODIN) 5-325 MG tablet Take 1 tablet by mouth every 4 (four) hours as needed. Patient not taking: Reported on 04/22/2018 04/21/17   Charlann Lange, PA-C  ibuprofen (ADVIL,MOTRIN) 600 MG tablet Take 1 tablet (600 mg total) by mouth every 6 (six) hours as needed. Patient not taking: Reported on 04/22/2018 04/21/17   Charlann Lange, PA-C  levocetirizine (XYZAL) 5 MG tablet TAKE ONE TABLET BY MOUTH ONCE DAILY IN THE EVENING 05/16/16   Wardell Honour, MD  losartan (COZAAR) 100 MG tablet Take 100 mg by mouth daily.     [provider]  metFORMIN (GLUCOPHAGE) 500 MG tablet Take 1 tablet (500 mg total) by mouth daily with breakfast. Patient not taking: Reported on 04/22/2018 03/05/15   Ezekiel Slocumb, PA-C  sertraline (ZOLOFT) 50 MG tablet Take 50 mg by mouth every  evening. 03/31/17   [provider]  simvastatin (ZOCOR) 40 MG tablet Take 1 tablet (40 mg total) by mouth at bedtime. 08/14/15   Wardell Honour, MD  tamsulosin (FLOMAX) 0.4 MG CAPS capsule TAKE ONE CAPSULE BY MOUTH ONCE DAILY *NEED  OFFICE  VISIT* 12/24/15   Gale Journey, Damaris Hippo, PA-C      Allergies    Enalapril maleate    Review of Systems   Review of Systems  Neurological:  Positive for weakness.  All other systems reviewed and are negative.   Physical Exam Updated Vital Signs BP (!) 142/74   Pulse 68   Temp 99.8 F (37.7 C) (Oral)   Resp 15   Wt 79.7 kg   SpO2 98%   BMI 27.50 kg/m  Physical Exam Vitals and nursing note reviewed.  Constitutional:      Comments: Uncomfortable  HENT:     Head: Normocephalic.     Nose: Nose normal.     Mouth/Throat:     Mouth: Mucous membranes are moist.  Eyes:     Extraocular Movements: Extraocular movements intact.     Pupils: Pupils are equal, round, and reactive to light.     Comments: Patient's eye pressure is 15 on the right and 14 on the left  Cardiovascular:     Rate and Rhythm: Normal rate and regular rhythm.     Pulses: Normal pulses.     Heart sounds: Normal heart sounds.  Pulmonary:     Effort: Pulmonary effort is normal.     Breath sounds: Normal breath sounds.  Abdominal:     General: Abdomen is flat.     Palpations: Abdomen is soft.  Musculoskeletal:        General: Normal range of motion.     Cervical back: Normal range of motion and neck supple.  Skin:    General: Skin is warm.     Capillary Refill: Capillary refill takes less than 2 seconds.  Neurological:     General: No focal deficit present.     Mental Status: Martin [name].     Comments: Cranial nerves II to XII intact.  Patient has no obvious visual field cut.  Questionable right facial droop  Psychiatric:        Mood and Affect: Mood normal.        Behavior: Behavior normal.     ED Results / Procedures / Treatments   Labs (all labs ordered are  listed, but only abnormal results are displayed) Labs Reviewed  CBC - Abnormal; Notable for the following components:      Result Value   WBC 12.7 (*)    All other components within normal limits  DIFFERENTIAL - Abnormal; Notable for the following components:   Neutro Abs 9.8 (*)    Monocytes Absolute 1.2 (*)    All other components within normal limits  COMPREHENSIVE METABOLIC PANEL - Abnormal; Notable for the following components:   Sodium 134 (*)    CO2 21 (*)    Glucose, Bld 223 (*)    All other components within normal limits  CBG MONITORING, ED - Abnormal; Notable for the following components:   Glucose-Capillary 206 (*)    All other components within normal limits  I-STAT CHEM 8, ED - Abnormal; Notable for the following components:   Glucose, Bld 232 (*)    Calcium, Ion 1.10 (*)    TCO2 20 (*)    All other components within normal limits  RESP PANEL BY RT-PCR (FLU A&B, COVID) ARPGX2  ETHANOL  PROTIME-INR  APTT  URINALYSIS, ROUTINE W REFLEX MICROSCOPIC  RAPID URINE DRUG SCREEN, HOSP PERFORMED    EKG EKG Interpretation  Date/Time:  Monday September 29 2021 21:15:56 EDT Ventricular Rate:  79 PR Interval:  148 QRS Duration: 130 QT Interval:  400 QTC Calculation: 458 R Axis:   76 Text Interpretation: Normal sinus rhythm Right bundle branch block Abnormal ECG When compared with ECG of 07-Mar-2021 17:21, PREVIOUS ECG IS PRESENT Confirmed by Wandra Arthurs [phone]) on 09/29/2021 9:42:36 PM  Radiology CT ANGIO HEAD NECK W WO CM  Result Date: 09/29/2021 CLINICAL DATA:  Acute neurologic deficit EXAM: CT ANGIOGRAPHY HEAD AND NECK TECHNIQUE: Multidetector CT imaging of the head and neck was performed using the standard protocol during bolus administration of intravenous contrast. Multiplanar CT image reconstructions and MIPs were obtained to evaluate the vascular anatomy. Carotid stenosis measurements (when applicable) are obtained utilizing NASCET criteria, using the distal internal  carotid diameter as the denominator. RADIATION DOSE REDUCTION: This exam was performed according to the departmental dose-optimization program which includes automated exposure control, adjustment of the mA and/or kV according to patient size and/or use of iterative reconstruction technique. CONTRAST:  68m OMNIPAQUE IOHEXOL 350 MG/ML SOLN COMPARISON:  Brain MRI 06/09/2010 FINDINGS: CTA NECK FINDINGS SKELETON: There is no bony spinal canal stenosis. No lytic or blastic lesion. OTHER NECK: Normal pharynx, larynx and major salivary glands. No cervical lymphadenopathy. Unremarkable thyroid  gland. UPPER CHEST: No pneumothorax or pleural effusion. No nodules or masses. AORTIC ARCH: There is calcific atherosclerosis of the aortic arch. There is no aneurysm, dissection or hemodynamically significant stenosis of the visualized portion of the aorta. Conventional 3 vessel aortic branching pattern. The visualized proximal subclavian arteries are widely patent. RIGHT CAROTID SYSTEM: Normal without aneurysm, dissection or stenosis. LEFT CAROTID SYSTEM: Normal without aneurysm, dissection or stenosis. VERTEBRAL ARTERIES: Left dominant configuration. Both origins are clearly patent. There is no dissection, occlusion or flow-limiting stenosis to the skull base (V1-V3 segments). CTA HEAD FINDINGS POSTERIOR CIRCULATION: --Vertebral arteries: Normal V4 segments. --Inferior cerebellar arteries: Normal. --Basilar artery: Normal. --Superior cerebellar arteries: Normal. --Posterior cerebral arteries (PCA): Normal. ANTERIOR CIRCULATION: --Intracranial internal carotid arteries: Left ICA is poorly --Anterior cerebral arteries (ACA): Normal. Both A1 segments are present. Patent anterior communicating artery (a-comm). --Middle cerebral arteries (MCA): The left MCA is occluded at its origin, unchanged since 06/09/2010 VENOUS SINUSES: As permitted by contrast timing, patent. ANATOMIC VARIANTS: None Review of the MIP images confirms the above  findings. IMPRESSION: 1. Chronic occlusion of the left MCA at its origin, unchanged since 06/09/2010. 2. No emergent large vessel occlusion. Aortic atherosclerosis (ICD10-I70.0). Electronically Signed   By: Ulyses Jarred M.D.   On: 09/29/2021 22:17   CT HEAD CODE STROKE WO CONTRAST  Result Date: 09/29/2021 CLINICAL DATA:  Code stroke.  Transient ischemic attack. EXAM: CT HEAD WITHOUT CONTRAST TECHNIQUE: Contiguous axial images were obtained from the base of the skull through the vertex without intravenous contrast. RADIATION DOSE REDUCTION: This exam was performed according to the departmental dose-optimization program which includes automated exposure control, adjustment of the mA and/or kV according to patient size and/or use of iterative reconstruction technique. COMPARISON:  03/07/2021 FINDINGS: Brain: Chronic small-vessel ischemic changes of the cerebral hemispheric white matter. No sign of acute infarction, mass lesion, hemorrhage, hydrocephalus or extra-axial collection. Vascular: Question relative hyperdensity of the right MCA compared to the left. Skull: Negative Sinuses/Orbits: Inflammatory change of the paranasal sinuses including complete opacification of the right maxillary sinus. Orbits negative. Other: None ASPECTS ([place] Stroke Program Early CT Score) - Ganglionic level infarction (caudate, lentiform nuclei, internal capsule, insula, M1-M3 cortex): 7 - Supraganglionic infarction (M4-M6 cortex): 3 Total score (0-10 with 10 being normal): 10 IMPRESSION: 1. No acute parenchymal head CT finding. Chronic small-vessel ischemic changes affecting the cerebral hemispheric white matter. 2. Question relative hyperdensity of the right MCA. 3. ASPECTS is 10 4. These results were communicated to Dr. Lorrin Goodell at 9:39 pm on 09/29/2021 by text page via the [facility] messaging system. Electronically Signed   By: Nelson Chimes M.D.   On: 09/29/2021 21:41    Procedures Procedures    Medications Ordered in  ED Medications  iohexol (OMNIPAQUE) 350 MG/ML injection 65 mL (65 mLs Intravenous Contrast Given 09/29/21 2153)  tetracaine (PONTOCAINE) 0.5 % ophthalmic solution 2 drop (2 drops Both Eyes Given by Other 09/29/21 2332)  morphine (PF) 4 MG/ML injection 4 mg (4 mg Intravenous Given 09/29/21 2224)  metoCLOPramide (REGLAN) injection 10 mg (10 mg Intravenous Given 09/29/21 2222)  diphenhydrAMINE (BENADRYL) injection 25 mg (25 mg Intravenous Given 09/29/21 2221)  LORazepam (ATIVAN) injection 1 mg (1 mg Intravenous Given 09/29/21 2332)    ED Course/ Medical Decision Making/ A&P                           Medical Decision Making BEULAH MATUSEK is a 76 y.o. male here presenting  with right eye blurry vision and headache and weakness.  Patient has normal eye pressure.  Initial concern for possible angle-closure glaucoma versus stroke versus bleed versus complex migraine.  Since patient has normal eye pressure I do not think Martin has acute angle-closure glaucoma.  Code stroke was activated in triage.  Neurology recommend CTA head and neck and MRI brain and migraine cocktail  11:59 PM CTA unremarkable.  MRI brain pending.  Signed out to Dr. Roxanne Mins in the ED    Amount and/or Complexity of Data Reviewed Labs: ordered. Radiology: ordered.  Risk Prescription drug management.    Final Clinical Impression(s) / ED Diagnoses Final diagnoses:  None    Rx / DC Orders ED Discharge Orders     None         Drenda Freeze, MD 09/29/21 2359

## 2021-09-29 NOTE — ED Triage Notes (Addendum)
Pt states right eye pain for about 5 days, pt was given lubricant drops by his eye doctor. Pt having difficulty telling me intiailly how he is feeling, says that he does not have a headache, but hurts in his right eye. Mild right sided facial droop, weakness in the RLE. (Pt says he feels weak in both of his feet)  PA in triage alerted to eval pt, appears to answer her questions better. Pt is unable to discern timeframe.[name] reporting symptoms occurred around 1830-1900. Pt take to CT.

## 2021-09-29 NOTE — ED Provider Notes (Signed)
Care assumed from Dr. Darl Householder, patient with history of glaucoma coming in with headache and blurry vision and weakness.  Initially was a code stroke, felt not to be a stroke.  Ocular pressures are appropriate.  He is getting a migraine cocktail and MRI and will need to be reassessed.  MRI shows no evidence of stroke or other acute process.  I have independently viewed the images, and agree with the radiologist's interpretation.  Patient is feeling much better following migraine cocktail and is felt to be safe for discharge.  Results for orders placed or performed during the hospital encounter of 09/29/21  Resp Panel by RT-PCR (Flu A&B, Covid) Anterior Nasal Swab   Specimen: Anterior Nasal Swab  Result Value Ref Range   SARS Coronavirus 2 by RT PCR NEGATIVE NEGATIVE   Influenza A by PCR NEGATIVE NEGATIVE   Influenza B by PCR NEGATIVE NEGATIVE  CBC  Result Value Ref Range   WBC 12.7 (H) 4.0 - 10.5 K/uL   RBC 4.25 4.22 - 5.81 MIL/uL   Hemoglobin 13.4 13.0 - 17.0 g/dL   HCT 39.6 39.0 - 52.0 %   MCV 93.2 80.0 - 100.0 fL   MCH 31.5 26.0 - 34.0 pg   MCHC 33.8 30.0 - 36.0 g/dL   RDW 12.7 11.5 - 15.5 %   Platelets 303 150 - 400 K/uL   nRBC 0.0 0.0 - 0.2 %  Ethanol  Result Value Ref Range   Alcohol, Ethyl (B) <10 <10 mg/dL  Protime-INR  Result Value Ref Range   Prothrombin Time 14.7 11.4 - 15.2 seconds   INR 1.2 0.8 - 1.2  APTT  Result Value Ref Range   aPTT 29 24 - 36 seconds  Differential  Result Value Ref Range   Neutrophils Relative % 77 %   Neutro Abs 9.8 (H) 1.7 - 7.7 K/uL   Lymphocytes Relative 11 %   Lymphs Abs 1.4 0.7 - 4.0 K/uL   Monocytes Relative 10 %   Monocytes Absolute 1.2 (H) 0.1 - 1.0 K/uL   Eosinophils Relative 1 %   Eosinophils Absolute 0.1 0.0 - 0.5 K/uL   Basophils Relative 1 %   Basophils Absolute 0.1 0.0 - 0.1 K/uL   Immature Granulocytes 0 %   Abs Immature Granulocytes 0.05 0.00 - 0.07 K/uL  Comprehensive metabolic panel  Result Value Ref Range   Sodium 134  (L) 135 - 145 mmol/L   Potassium 3.7 3.5 - 5.1 mmol/L   Chloride 101 98 - 111 mmol/L   CO2 21 (L) 22 - 32 mmol/L   Glucose, Bld 223 (H) 70 - 99 mg/dL   BUN 12 8 - 23 mg/dL   Creatinine, Ser 0.97 0.61 - 1.24 mg/dL   Calcium 9.0 8.9 - 10.3 mg/dL   Total Protein 7.8 6.5 - 8.1 g/dL   Albumin 3.5 3.5 - 5.0 g/dL   AST 16 15 - 41 U/L   ALT 18 0 - 44 U/L   Alkaline Phosphatase 54 38 - 126 U/L   Total Bilirubin 0.6 0.3 - 1.2 mg/dL   GFR, Estimated >60 >60 mL/min   Anion gap 12 5 - 15  CBG monitoring, ED  Result Value Ref Range   Glucose-Capillary 206 (H) 70 - 99 mg/dL  I-stat chem 8, ED  Result Value Ref Range   Sodium 135 135 - 145 mmol/L   Potassium 3.7 3.5 - 5.1 mmol/L   Chloride 101 98 - 111 mmol/L   BUN 12 8 - 23 mg/dL  Creatinine, Ser 0.70 0.61 - 1.24 mg/dL   Glucose, Bld 232 (H) 70 - 99 mg/dL   Calcium, Ion 1.10 (L) 1.15 - 1.40 mmol/L   TCO2 20 (L) 22 - 32 mmol/L   Hemoglobin 13.6 13.0 - 17.0 g/dL   HCT 40.0 39.0 - 52.0 %   MR BRAIN WO CONTRAST  Result Date: 09/30/2021 CLINICAL DATA:  Transient ischemic attack and headache. EXAM: MRI HEAD WITHOUT CONTRAST TECHNIQUE: Multiplanar, multiecho pulse sequences of the brain and surrounding structures were obtained without intravenous contrast. COMPARISON:  None Available. FINDINGS: Brain: No acute infarct, mass effect or extra-axial collection. No acute or chronic hemorrhage. There is multifocal hyperintense T2-weighted signal within the white matter. Generalized volume loss. The midline structures are normal. Vascular: Major flow voids are preserved. Skull and upper cervical spine: Normal calvarium and skull base. Visualized upper cervical spine and soft tissues are normal. Sinuses/Orbits:Complete opacification of the right maxillary sinus. Normal orbits. IMPRESSION: 1. No acute intracranial abnormality. 2. Findings of chronic small vessel ischemia and volume loss. Electronically Signed   By: Ulyses Jarred M.D.   On: 09/30/2021 00:46    CT ANGIO HEAD NECK W WO CM  Result Date: 09/29/2021 CLINICAL DATA:  Acute neurologic deficit EXAM: CT ANGIOGRAPHY HEAD AND NECK TECHNIQUE: Multidetector CT imaging of the head and neck was performed using the standard protocol during bolus administration of intravenous contrast. Multiplanar CT image reconstructions and MIPs were obtained to evaluate the vascular anatomy. Carotid stenosis measurements (when applicable) are obtained utilizing NASCET criteria, using the distal internal carotid diameter as the denominator. RADIATION DOSE REDUCTION: This exam was performed according to the departmental dose-optimization program which includes automated exposure control, adjustment of the mA and/or kV according to patient size and/or use of iterative reconstruction technique. CONTRAST:  32m OMNIPAQUE IOHEXOL 350 MG/ML SOLN COMPARISON:  Brain MRI 06/09/2010 FINDINGS: CTA NECK FINDINGS SKELETON: There is no bony spinal canal stenosis. No lytic or blastic lesion. OTHER NECK: Normal pharynx, larynx and major salivary glands. No cervical lymphadenopathy. Unremarkable thyroid gland. UPPER CHEST: No pneumothorax or pleural effusion. No nodules or masses. AORTIC ARCH: There is calcific atherosclerosis of the aortic arch. There is no aneurysm, dissection or hemodynamically significant stenosis of the visualized portion of the aorta. Conventional 3 vessel aortic branching pattern. The visualized proximal subclavian arteries are widely patent. RIGHT CAROTID SYSTEM: Normal without aneurysm, dissection or stenosis. LEFT CAROTID SYSTEM: Normal without aneurysm, dissection or stenosis. VERTEBRAL ARTERIES: Left dominant configuration. Both origins are clearly patent. There is no dissection, occlusion or flow-limiting stenosis to the skull base (V1-V3 segments). CTA HEAD FINDINGS POSTERIOR CIRCULATION: --Vertebral arteries: Normal V4 segments. --Inferior cerebellar arteries: Normal. --Basilar artery: Normal. --Superior cerebellar  arteries: Normal. --Posterior cerebral arteries (PCA): Normal. ANTERIOR CIRCULATION: --Intracranial internal carotid arteries: Left ICA is poorly --Anterior cerebral arteries (ACA): Normal. Both A1 segments are present. Patent anterior communicating artery (a-comm). --Middle cerebral arteries (MCA): The left MCA is occluded at its origin, unchanged since 06/09/2010 VENOUS SINUSES: As permitted by contrast timing, patent. ANATOMIC VARIANTS: None Review of the MIP images confirms the above findings. IMPRESSION: 1. Chronic occlusion of the left MCA at its origin, unchanged since 06/09/2010. 2. No emergent large vessel occlusion. Aortic atherosclerosis (ICD10-I70.0). Electronically Signed   By: KUlyses JarredM.D.   On: 09/29/2021 22:17   CT HEAD CODE STROKE WO CONTRAST  Result Date: 09/29/2021 CLINICAL DATA:  Code stroke.  Transient ischemic attack. EXAM: CT HEAD WITHOUT CONTRAST TECHNIQUE: Contiguous axial images were obtained from  the base of the skull through the vertex without intravenous contrast. RADIATION DOSE REDUCTION: This exam was performed according to the departmental dose-optimization program which includes automated exposure control, adjustment of the mA and/or kV according to patient size and/or use of iterative reconstruction technique. COMPARISON:  03/07/2021 FINDINGS: Brain: Chronic small-vessel ischemic changes of the cerebral hemispheric white matter. No sign of acute infarction, mass lesion, hemorrhage, hydrocephalus or extra-axial collection. Vascular: Question relative hyperdensity of the right MCA compared to the left. Skull: Negative Sinuses/Orbits: Inflammatory change of the paranasal sinuses including complete opacification of the right maxillary sinus. Orbits negative. Other: None ASPECTS ([place] Stroke Program Early CT Score) - Ganglionic level infarction (caudate, lentiform nuclei, internal capsule, insula, M1-M3 cortex): 7 - Supraganglionic infarction (M4-M6 cortex): 3 Total score  (0-10 with 10 being normal): 10 IMPRESSION: 1. No acute parenchymal head CT finding. Chronic small-vessel ischemic changes affecting the cerebral hemispheric white matter. 2. Question relative hyperdensity of the right MCA. 3. ASPECTS is 10 4. These results were communicated to Dr. Lorrin Goodell at 9:39 pm on 09/29/2021 by text page via the [facility] messaging system. Electronically Signed   By: Nelson Chimes M.D.   On: 09/62/8366 29:47       Delora Fuel, MD 65/46/50 [phone]

## 2021-09-29 NOTE — Consult Note (Signed)
NEUROLOGY CONSULTATION NOTE   Date of service: September 29, 2021 Patient Name: Martin [name] MRN:  [id number] DOB:  1945/11/12 Reason for consult: "stroke code for concern for R facial droop" Requesting Provider: Drenda Freeze, MD _ _ _   _ __   _ __ _ _  __ __   _ __   __ _  History of Present Illness  Martin [name] is a 76 y.o. male with PMH significant for anxiety, BPH, DM2, HTN, HLD, Glaucoma, myofascial pain who presents with BL lower ext weakness and pain.  He reports that he was trying to get out of his chair around 1830 to go to the kitchen and was weak in both legs and helped himself to the ground. He reports that he could not get up and called EMS. He was brought in to the ED where there was some concern about a mild R facial droop and Right sided weakness a code stroke was activated.  On my evaluation, patient reports right frontal dull ache, reports pain in bilateral hips and feels that both of his feet are weak.  I do not notice any facial asymmetry or facial droop. He does seem to have blurred vision in R eye and difficulty accurately counting my finger with his R eye. Reports eye pain has been going on since Wednesday. Just opening and closing his eye hurts.  LKW: 09/24/21 for eye pain and blurred vision. mRS: 0 tNKASE: not offered, low suspicion for stroke Thrombectomy: not offered, low suspicion for stroke NIHSS components Score: Comment  1a Level of Conscious 0'[x]'$  1'[]'$  2'[]'$  3'[]'$      1b LOC Questions 0'[x]'$  1'[]'$  2'[]'$       1c LOC Commands 0'[x]'$  1'[]'$  2'[]'$       2 Best Gaze 0'[x]'$  1'[]'$  2'[]'$       3 Visual 0'[x]'$  1'[]'$  2'[]'$  3'[]'$    R mono-occular blurred vision but no hemianopsia.  4 Facial Palsy 0'[x]'$  1'[]'$  2'[]'$  3'[]'$      5a Motor Arm - left 0'[x]'$  1'[]'$  2'[]'$  3'[]'$  4'[]'$  UN'[]'$    5b Motor Arm - Right 0'[x]'$  1'[]'$  2'[]'$  3'[]'$  4'[]'$  UN'[]'$    6a Motor Leg - Left 0'[x]'$  1'[]'$  2'[]'$  3'[]'$  4'[]'$  UN'[]'$    6b Motor Leg - Right 0'[x]'$  1'[]'$  2'[]'$  3'[]'$  4'[]'$  UN'[]'$    7 Limb Ataxia 0'[x]'$  1'[]'$  2'[]'$  3'[]'$  UN'[]'$     8 Sensory 0'[x]'$  1'[]'$  2'[]'$  UN'[]'$      9 Best  Language 0'[x]'$  1'[]'$  2'[]'$  3'[]'$      10 Dysarthria 0'[x]'$  1'[]'$  2'[]'$  UN'[]'$      11 Extinct. and Inattention 0'[x]'$  1'[]'$  2'[]'$       TOTAL: 0         ROS   Constitutional Denies weight loss, fever and chills.   HEENT + changes in vision but intact hearing.   Respiratory Denies SOB and cough.   CV Denies palpitations and CP   GI Denies abdominal pain, nausea, vomiting and diarrhea.   GU Denies dysuria and urinary frequency.   MSK Denies myalgia and joint pain.   Skin Denies rash and pruritus.   Neurological Denies headache and syncope.   Psychiatric Denies recent changes in mood. Denies anxiety and depression.   Past History   Past Medical History:  Diagnosis Date   Allergic rhinitis due to allergen    Anxiety    BPH (benign prostatic hyperplasia)    Caregiver stress    Cataract    DDD (degenerative disc disease), cervical    Depressed    Diabetes mellitus  without complication (HCC)    Dyspnea    Glaucoma    Bil.   Hyperlipidemia    Hypertension    Impaired glucose tolerance 04/19/5359   Metabolic syndrome    Myofascial pain dysfunction syndrome    Past Surgical History:  Procedure Laterality Date   BACK SURGERY     29+years ago   CARPAL TUNNEL RELEASE Right    SHOULDER ARTHROSCOPY   right shoulder   with bone spurs   TONSILECTOMY, ADENOIDECTOMY, BILATERAL MYRINGOTOMY AND TUBES  1959   Family History  Problem Relation Age of Onset   Alcohol abuse Other    Diabetes Other    Hyperlipidemia Other    Kidney disease Other    Coronary artery disease Other    Heart disease Mother    Dementia Mother    Heart disease Father    Coronary artery disease Father    Social History   Socioeconomic History   Marital status: Single    Spouse name: Not on file   Number of children: Not on file   Years of education: Not on file   Highest education level: Not on file  Occupational History   Occupation: retired  Tobacco Use   Smoking status: Never   Smokeless tobacco: Never  Vaping Use    Vaping Use: Never used  Substance and Sexual Activity   Alcohol use: No   Drug use: No   Sexual activity: Not Currently  Other Topics Concern   Not on file  Social History Narrative   No regular exercise.   Lives with his mother, with dementia.   Social Determinants of Health   Financial Resource Strain: Not on file  Food Insecurity: Not on file  Transportation Needs: Not on file  Physical Activity: Not on file  Stress: Not on file  Social Connections: Not on file   Allergies  Allergen Reactions   Enalapril Maleate     REACTION: rash    Medications  (Not in a hospital admission)    Vitals   Vitals:   09/29/21 2230 09/29/21 2300 09/29/21 2315 09/29/21 2330  BP: 131/64 138/64 140/70 (!) 142/74  Pulse: 66 64 63 68  Resp: 15 (!) '27 19 15  '$ Temp:      TempSrc:      SpO2: 91% 96% 97% 98%  Weight:         Body mass index is 27.5 kg/m.  Physical Exam   General: Laying comfortably in bed; in no acute distress.  HENT: Normal oropharynx and mucosa. Normal external appearance of ears and nose.  Neck: Supple, no pain or tenderness  CV: No JVD. No peripheral edema.  Pulmonary: Symmetric Chest rise. Normal respiratory effort.  Abdomen: Soft to touch, non-tender.  Ext: No cyanosis, edema, or deformity  Skin: No rash. Normal palpation of skin.   Musculoskeletal: Normal digits and nails by inspection. No clubbing.   Neurologic Examination  Mental status/Cognition: Alert, oriented to self, place, month and year, good attention.  Speech/language: Fluent, comprehension intact, object naming intact, repetition intact.  Cranial nerves:   CN II Pupils equal and reactive to light, R eye mono-occular blurred vision   CN III,IV,VI EOM intact, no gaze preference or deviation, no nystagmus    CN V normal sensation in V1, V2, and V3 segments bilaterally    CN VII no asymmetry, no nasolabial fold flattening    CN VIII normal hearing to speech    CN IX & X normal palatal  elevation, no  uvular deviation    CN XI 5/5 head turn and 5/5 shoulder shrug bilaterally    CN XII midline tongue protrusion    Motor:  Muscle bulk: poor, tone normal, pronator drift none tremor none Mvmt Root Nerve  Muscle Right Left Comments  SA C5/6 Ax Deltoid 5 5   EF C5/6 Mc Biceps 5 5   EE C6/7/8 Rad Triceps 5 5   WF C6/7 Med FCR     WE C7/8 PIN ECU     F Ab C8/T1 U ADM/FDI 5 5 Left 4th and 5th digit are flexed and have been since 1990s.  HF L1/2/3 Fem Illopsoas 5 5   KE L2/3/4 Fem Quad 4 4   DF L4/5 D Peron Tib Ant 4+ 4+   PF S1/2 Tibial Grc/Sol 4+ 4+    Reflexes:  Right Left Comments  Pectoralis      Biceps (C5/6) 1 1   Brachioradialis (C5/6) 1 1    Triceps (C6/7) 1 1    Patellar (L3/4) 1 1    Achilles (S1)      Hoffman      Plantar     Jaw jerk    Sensation:  Light touch Intact throughout   Pin prick    Temperature    Vibration   Proprioception    Coordination/Complex Motor:  - Finger to Nose intact BL - Heel to shin with mild ataxia but largely due to pain and resulting poor effort - Rapid alternating movement are normal - Gait: Deferred.  Labs   CBC:  Recent Labs  Lab 09/29/21 2120 09/29/21 2129  WBC 12.7*  --   NEUTROABS 9.8*  --   HGB 13.4 13.6  HCT 39.6 40.0  MCV 93.2  --   PLT 303  --     Basic Metabolic Panel:  Lab Results  Component Value Date   NA 135 09/29/2021   K 3.7 09/29/2021   CO2 21 (L) 09/29/2021   GLUCOSE 232 (H) 09/29/2021   BUN 12 09/29/2021   CREATININE 0.70 09/29/2021   CALCIUM 9.0 09/29/2021   GFRNONAA >60 09/29/2021   GFRAA >60 04/21/2017   Lipid Panel:  Lab Results  Component Value Date   LDLCALC 52 10/08/2014   HgbA1c:  Lab Results  Component Value Date   HGBA1C 6.1 03/05/2015   Urine Drug Screen: No results found for: "LABOPIA", "COCAINSCRNUR", "LABBENZ", "AMPHETMU", "THCU", "LABBARB"  Alcohol Level     Component Value Date/Time   ETH <10 09/29/2021 2120    CT Head without contrast(Personally  reviewed): CTH was negative for a large hypodensity concerning for a large territory infarct or hyperdensity concerning for an ICH  MRI Brain: pending    Impression   Martin [name] is a 76 y.o. male with PMH significant for anxiety, BPH, DM2, HTN, HLD, Glaucoma, myofascial pain who presents with BL lower ext weakness and pain. The weaknes in his legs does not appear to be out of proportion to pain. Also unlikely for a stroke to cause BL symptoms. As for his R eye vision deficit, this seems to be mono-occular rather than hemianopsia. I would not expect stroke to cause painful vision loss. Symptoms also seem to be going on for more than 5 days and thus outside the window for intervention. Thrombectomy or tnkASE was thus not offered.  Recommendations  - MRI Brain without contrast. No further workup if MRI Brain is negative. Follow up with neurology outpatient. ______________________________________________________________________   Thank you for the  opportunity to take part in the care of this patient. If you have any further questions, please contact the neurology consultation attending.  Signed,  [place] Pager Number [phone] _ _ _   _ __   _ __ _ _  __ __   _ __   __ _

## 2021-09-29 NOTE — ED Triage Notes (Signed)
Pt is coming from home, sitting in chair, went to get up and started feeling weak around 1900 tonight. Unable to stand fully, lowered himself to the ground, crawled to phone to call EMS. Cbg 224, en route 136/66, 73, 18rr, 95% ra. EKG complete

## 2021-09-29 NOTE — ED Provider Triage Note (Signed)
Emergency Medicine Provider Triage Evaluation Note  Martin [name] , a 76 y.o. male  was evaluated in triage.  Pt complains of generalized weakness.  Patient reports he went to go stand up and around 1830/1900 tonight he was unable to stand up fully and lowered himself to the ground and called EMS.  CBG 224.  Vital signs intact.  Patient reports that he just overall feels fatigued and weak.  He reports he has also been having right-sided eye pain for the past 5 days and was given lubricant drops by his eye doctor.  Review of Systems  Positive:  Negative:   Physical Exam  BP 128/63   Pulse 80   Temp 99.8 F (37.7 C) (Oral)   Resp 18   SpO2 99%  Gen:   Awake, no distress   Resp:  Normal effort  MSK:   Moves extremities without difficulty  Other:  Right-sided slight facial droop noted near the mouth.  Patient's pupils are sluggish.  He is having some trouble word finding.  Some possible decreased strength in his right lower extremity although very mild if present.  Sensation intact.  No pronator drift.  Medical Decision Making  Medically screening exam initiated at 9:17 PM.  Appropriate orders placed.  Merlene Morse was informed that the remainder of the evaluation will be completed by another provider, this initial triage assessment does not replace that evaluation, and the importance of remaining in the ED until their evaluation is complete.  Patient is not ill-appearing however is in ED stroke window.  Given his right-sided facial droop as well as his trouble word finding, will place code stroke orders.  Last known well around 1830.   Sherrell Puller, PA-C 09/29/21 2119

## 2021-09-30 LAB — RAPID URINE DRUG SCREEN, HOSP PERFORMED
Amphetamines: NOT DETECTED
Barbiturates: NOT DETECTED
Benzodiazepines: NOT DETECTED
Cocaine: NOT DETECTED
Opiates: POSITIVE — AB
Tetrahydrocannabinol: NOT DETECTED

## 2021-09-30 LAB — URINALYSIS, ROUTINE W REFLEX MICROSCOPIC
Bacteria, UA: NONE SEEN
Bilirubin Urine: NEGATIVE
Glucose, UA: 50 mg/dL — AB
Hgb urine dipstick: NEGATIVE
Ketones, ur: NEGATIVE mg/dL
Leukocytes,Ua: NEGATIVE
Nitrite: NEGATIVE
Protein, ur: 30 mg/dL — AB
Specific Gravity, Urine: >1.046 — ABNORMAL HIGH (ref 1.005–1.030)
pH: 5 (ref 5.0–8.0)

## 2021-09-30 LAB — CBG MONITORING, ED: Glucose-Capillary: 225 mg/dL — ABNORMAL HIGH (ref 70–99)

## 2021-09-30 MED ORDER — INSULIN ASPART 100 UNIT/ML IJ SOLN
0.0000 [IU] | Freq: Every day | INTRAMUSCULAR | Status: DC
  Administered 2021-09-30: 2 [IU] via SUBCUTANEOUS
  Administered 2021-10-01: 4 [IU] via SUBCUTANEOUS

## 2021-09-30 MED ORDER — LOSARTAN POTASSIUM 50 MG PO TABS
100.0000 mg | ORAL_TABLET | Freq: Every day | ORAL | Status: DC
  Administered 2021-09-30 – 2021-10-02 (×3): 100 mg via ORAL
  Filled 2021-09-30 (×3): qty 2

## 2021-09-30 MED ORDER — CARVEDILOL 3.125 MG PO TABS
6.2500 mg | ORAL_TABLET | Freq: Every day | ORAL | Status: DC
  Administered 2021-09-30 – 2021-10-01 (×2): 6.25 mg via ORAL
  Filled 2021-09-30 (×2): qty 2

## 2021-09-30 MED ORDER — ATORVASTATIN CALCIUM 40 MG PO TABS
40.0000 mg | ORAL_TABLET | Freq: Every day | ORAL | Status: DC
  Administered 2021-09-30 – 2021-10-02 (×3): 40 mg via ORAL
  Filled 2021-09-30 (×3): qty 1

## 2021-09-30 MED ORDER — LOSARTAN POTASSIUM-HCTZ 100-25 MG PO TABS: 1.0000 | ORAL_TABLET | Freq: Every day | ORAL | Status: DC

## 2021-09-30 MED ORDER — SERTRALINE HCL 100 MG PO TABS
100.0000 mg | ORAL_TABLET | Freq: Every day | ORAL | Status: DC
  Administered 2021-09-30 – 2021-10-02 (×3): 100 mg via ORAL
  Filled 2021-09-30 (×3): qty 1

## 2021-09-30 MED ORDER — TAMSULOSIN HCL 0.4 MG PO CAPS
0.4000 mg | ORAL_CAPSULE | Freq: Every day | ORAL | Status: DC
  Administered 2021-09-30 – 2021-10-02 (×3): 0.4 mg via ORAL
  Filled 2021-09-30 (×3): qty 1

## 2021-09-30 MED ORDER — HYDROCHLOROTHIAZIDE 25 MG PO TABS
25.0000 mg | ORAL_TABLET | Freq: Every day | ORAL | Status: DC
  Administered 2021-09-30 – 2021-10-02 (×3): 25 mg via ORAL
  Filled 2021-09-30 (×3): qty 1

## 2021-09-30 MED ORDER — INSULIN ASPART 100 UNIT/ML IJ SOLN
0.0000 [IU] | Freq: Three times a day (TID) | INTRAMUSCULAR | Status: DC
  Administered 2021-10-01 (×2): 5 [IU] via SUBCUTANEOUS
  Administered 2021-10-01 – 2021-10-02 (×3): 3 [IU] via SUBCUTANEOUS

## 2021-09-30 NOTE — Evaluation (Signed)
Occupational Therapy Evaluation Patient Details Name: Martin [name] MRN: [id number] DOB: 11-06-1945 Today's Date: 09/30/2021   History of Present Illness 76 y/o male admitted secondary to increased weakness and inability to stand. PMH includes HTN, DM, glaucoma, and back surgery.   Clinical Impression   PTA, pt was living alone and performing ADLs and IADLs; has someone come once a month for cleaning. Reports that recently he has been using RW for balance, has had falls, and difficulty with ADLs. Pt presenting with lethargy, decreased balance, and limited activity tolerance. Pt would benefit from further acute OT to facilitate safe dc. Recommend dc to SNF for further OT to optimize safety, independence with ADLs, and return to PLOF.    Recommendations for follow up therapy are one component of a multi-disciplinary discharge planning process, led by the attending physician.  Recommendations may be updated based on patient status, additional functional criteria and insurance authorization.   Follow Up Recommendations  Skilled nursing-short term rehab (<3 hours/day)    Assistance Recommended at Discharge Frequent or constant Supervision/Assistance  Patient can return home with the following      Functional Status Assessment  Patient has had a recent decline in their functional status and demonstrates the ability to make significant improvements in function in a reasonable and predictable amount of time.  Equipment Recommendations  None recommended by OT    Recommendations for Other Services       Precautions / Restrictions Precautions Precautions: Fall Precaution Comments: Reports hx of falls Restrictions Weight Bearing Restrictions: No      Mobility Bed Mobility Overal bed mobility: Needs Assistance Bed Mobility: Supine to Sit, Sit to Supine     Supine to sit: Min guard Sit to supine: Min guard   General bed mobility comments: Min guard for safety    Transfers Overall  transfer level: Needs assistance Equipment used: 2 person hand held assist Transfers: Sit to/from Stand Sit to Stand: Min assist, +2 physical assistance, +2 safety/equipment           General transfer comment: Assist for lift assist and steadying.      Balance Overall balance assessment: Needs assistance Sitting-balance support: No upper extremity supported, Feet supported Sitting balance-Leahy Scale: Fair     Standing balance support: Bilateral upper extremity supported, During functional activity Standing balance-Leahy Scale: Poor Standing balance comment: Reliant on UE support                           ADL either performed or assessed with clinical judgement   ADL Overall ADL's : Needs assistance/impaired Eating/Feeding: NPO   Grooming: Wash/dry face;Min guard;Sitting   Upper Body Bathing: Minimal assistance;Sitting   Lower Body Bathing: Moderate assistance;Sit to/from stand   Upper Body Dressing : Minimal assistance;Sitting   Lower Body Dressing: Moderate assistance;Sit to/from stand   Toilet Transfer: Minimal assistance;+2 for physical assistance;Rolling walker (2 wheels);Ambulation (simulated in room)           Functional mobility during ADLs: Minimal assistance;+2 for physical assistance;Rolling walker (2 wheels) General ADL Comments: Decreased balance and acitvity tolerance. Also lethargic and difficulty engaging     Vision         Perception     Praxis      Pertinent Vitals/Pain Pain Assessment Pain Assessment: No/denies pain     Hand Dominance     Extremity/Trunk Assessment Upper Extremity Assessment Upper Extremity Assessment: Generalized weakness;LUE deficits/detail LUE Deficits / Details: Baseline injury at  L fifth and fourth digit - in flexed position at baseline LUE Coordination: decreased fine motor   Lower Extremity Assessment Lower Extremity Assessment: Defer to PT evaluation LLE Deficits / Details: Increased weakness  noted in LLE compared to R. Grossly 3/5 at hip flexors and 4/5 in knee extensors   Cervical / Trunk Assessment Cervical / Trunk Assessment: Kyphotic   Communication Communication Communication: No difficulties   Cognition Arousal/Alertness: Lethargic Behavior During Therapy: Flat affect Overall Cognitive Status: No family/caregiver present to determine baseline cognitive functioning                                 General Comments: Pt very sleepy throughout. Slowed processing noted as well and requiring increased time throughout. Could be possibly due to medications - ativan given around midnight last night     General Comments  VSS    Exercises     Shoulder Instructions      Home Living Family/patient expects to be discharged to:: Private residence Living Arrangements: Alone Available Help at Discharge: Friend(s);Available PRN/intermittently Type of Home: House Home Access: Stairs to enter [employer] of Steps: 2 Entrance Stairs-Rails: Left Home Layout: One level     Bathroom Shower/Tub: Teacher, early years/pre: Standard     Home Equipment: Advice worker (2 wheels)          Prior Functioning/Environment Prior Level of Function : Independent/Modified Independent             Mobility Comments: Uses RW for mobility for the last 2-3 weeks ADLs Comments: Has someone coming 1X/month for cleaning. Reports independence with ADLs, but reports increased difficulty.        OT Problem List: Decreased strength;Decreased range of motion;Decreased activity tolerance;Impaired balance (sitting and/or standing);Decreased safety awareness;Decreased knowledge of use of DME or AE;Decreased knowledge of precautions      OT Treatment/Interventions: Self-care/ADL training;Therapeutic exercise;Energy conservation;DME and/or AE instruction;Therapeutic activities;Patient/family education    OT Goals(Current goals can be found in the  care plan section) Acute Rehab OT Goals Patient Stated Goal: Go home OT Goal Formulation: With patient Time For Goal Achievement: 10/14/21 Potential to Achieve Goals: Good  OT Frequency: Min 2X/week    Co-evaluation PT/OT/SLP Co-Evaluation/Treatment: Yes Reason for Co-Treatment: For patient/therapist safety;To address functional/ADL transfers PT goals addressed during session: Mobility/safety with mobility;Balance OT goals addressed during session: ADL's and self-care      AM-PAC OT "6 Clicks" Daily Activity     Outcome Measure Help from another person eating meals?: Total Help from another person taking care of personal grooming?: A Little Help from another person toileting, which includes using toliet, bedpan, or urinal?: A Little Help from another person bathing (including washing, rinsing, drying)?: A Lot Help from another person to put on and taking off regular upper body clothing?: A Little Help from another person to put on and taking off regular lower body clothing?: A Lot 6 Click Score: 14   End of Session Equipment Utilized During Treatment: Rolling walker (2 wheels);Gait belt Nurse Communication: Mobility status  Activity Tolerance: Patient tolerated treatment well Patient left: in bed;with call bell/phone within reach  OT Visit Diagnosis: Unsteadiness on feet (R26.81);Other abnormalities of gait and mobility (R26.89);Muscle weakness (generalized) (M62.81)                Time: 8101-7510 OT Time Calculation (min): 14 min Charges:  OT General Charges $OT Visit: 1 Visit OT Evaluation $  OT Eval Moderate Complexity: 1 Mod  Michall Noffke MSOT, OTR/L Acute Rehab Office: [place] 09/30/2021, 12:41 PM

## 2021-09-30 NOTE — ED Notes (Signed)
Pt eating dinner. Pt very weak in appearance, right facial drop noted especially in eye area. Pt alert and oriented, reporting that he feels better than he did before.

## 2021-09-30 NOTE — Progress Notes (Signed)
Transition of Care [facility]) - Emergency Department Mini Assessment   Patient Details  Name: Martin [name] MRN: [id number] Date of Birth: 09/07/1945  Transition of [facility]) CM/SW Contact:    Tajanae Guilbault C Tarpley-Carter, [place] Phone Number: 09/30/2021, 9:37 PM   Clinical Narrative: [facility] CSW consulted with pt and pts friend, Jeanie Cooks at bedside.  Pt is in agreeance with SNF and gave CSW permission to send information out for bed offer.  Zonie Crutcher Tarpley-Carter, MSW, LCSW-A Pronouns:  She/Her/Hers [facility] HealthTransitions of Care Clinical Social Worker Direct Number:  [phone] [email or web address]   ED Mini Assessment: What brought you to the Emergency Department? : Weakness  Barriers to Discharge: No Barriers Identified     Means of departure: Not know  Interventions which prevented an admission or readmission: SNF Placement    Patient Contact and Communications Key Contact 1: Jeanie Cooks   Spoke with: Jeanie Cooks Contact Date: 09/30/21,     Contact Phone Number: [phone] Call outcome: Jackelyn Poling wants pt to go to SNF  Patient states their goals for this hospitalization and ongoing recovery are:: Wants to go to SNF CMS Medicare.gov Compare Post Acute Care list provided to:: Patient Choice offered to / list presented to : Patient, NA [place] Rana Snare)  Admission diagnosis:  Weakness Patient Active Problem List   Diagnosis Date Noted   Bilateral impacted cerumen 08/21/2016   Conductive hearing loss, bilateral 08/21/2016   Gastroesophageal reflux disease without esophagitis 12/24/2015   Need for hepatitis C screening test 09/23/2015   Diabetes mellitus ([place]) 04/28/2013   Acute kidney injury ([place]) 07/08/2012   Caregiver stress 70/03/7492   Metabolic syndrome 49/67/5916   Dyspnea 12/24/2011   Myofascial pain dysfunction syndrome 07/24/2011   DDD (degenerative disc disease), cervical 06/24/2011   Anxiety 11/21/2010    Depression 11/21/2010   TRANSAMINASES, SERUM, ELEVATED 04/03/2010   Allergic rhinitis due to other allergen 07/18/2008   HYPERLIPIDEMIA 01/11/2008   HYPERTENSION, BENIGN ESSENTIAL 01/11/2008   HYPERTROPHY PROSTATE W/UR OBST & OTH LUTS 01/11/2008   PCP:  Bernerd Limbo, MD Pharmacy:   [facility] [place], [place] [address] [place] [address] Phone: [phone] Fax: [phone]  [facility] [place], [place] [address] Phone: [phone] Fax: [phone]  [place], [place] [address] Phone: [phone] Fax: [phone]

## 2021-09-30 NOTE — ED Notes (Signed)
Pt states he uses Nurse, mental health for transport, and they can help him home in the morning if he calls. Pt initially wanted to call an uber but is unable to ambulate independently needing assistance to stand up by staff. Pt given sandwich, drink, and will rest until am until he can call his typical transport.

## 2021-09-30 NOTE — ED Notes (Signed)
Pt is in MRI  

## 2021-09-30 NOTE — NC FL2 (Signed)
[place] LEVEL OF CARE SCREENING TOOL     IDENTIFICATION  Patient Name: Martin [name] Birthdate: [birth year] Sex: male Admission Date (Current Location): 09/29/2021  [facility]  [employer] and [place] Number:  Herbalist and Address:  [facility] [place], [place] [address] [place], [place] [address]      Provider Number: [id number]  Attending Physician Name and Address:  Default, Provider, MD  Relative Name and Phone Number:  Deggie Rana Snare [phone]    Current Level of Care:   Recommended Level of Care: [place] Prior Approval Number:    Date Approved/Denied:   PASRR Number: [id number]  Discharge Plan: SNF    Current Diagnoses: Patient Active Problem List   Diagnosis Date Noted   Bilateral impacted cerumen 08/21/2016   Conductive hearing loss, bilateral 08/21/2016   Gastroesophageal reflux disease without esophagitis 12/24/2015   Need for hepatitis C screening test 09/23/2015   Diabetes mellitus ([place]) 04/28/2013   Acute kidney injury ([place]) 07/08/2012   Caregiver stress 39/05/90   Metabolic syndrome 33/00/7622   Dyspnea 12/24/2011   Myofascial pain dysfunction syndrome 07/24/2011   DDD (degenerative disc disease), cervical 06/24/2011   Anxiety 11/21/2010   Depression 11/21/2010   TRANSAMINASES, SERUM, ELEVATED 04/03/2010   Allergic rhinitis due to other allergen 07/18/2008   HYPERLIPIDEMIA 01/11/2008   HYPERTENSION, BENIGN ESSENTIAL 01/11/2008   HYPERTROPHY PROSTATE W/UR OBST & OTH LUTS 01/11/2008    Orientation RESPIRATION BLADDER Height & Weight     Self, Place  Normal Continent Weight: 175 lb 9.6 oz (79.7 kg) Height:  '5\' 8"'$  (172.7 cm)  BEHAVIORAL SYMPTOMS/MOOD NEUROLOGICAL BOWEL NUTRITION STATUS      Continent Diet (Regular)  AMBULATORY STATUS COMMUNICATION OF NEEDS Skin   Limited Assist Verbally Normal                       Personal Care Assistance Level of Assistance  Bathing, Feeding, Dressing  Bathing Assistance: Limited assistance Feeding assistance: Independent (Nothing by mouth) Dressing Assistance: Limited assistance     Functional Limitations Info  Sight, Hearing, Speech Sight Info: Adequate Hearing Info: Adequate Speech Info: Adequate    SPECIAL CARE FACTORS FREQUENCY  PT (By licensed PT), OT (By licensed OT)     PT Frequency: 5 times a week OT Frequency: 5 times a week            Contractures Contractures Info: Not present    Additional Factors Info  Code Status, Allergies, Insulin Sliding Scale Code Status Info: Full Code Allergies Info: Enalapril Maleate   Insulin Sliding Scale Info: Novolog       Current Medications (09/30/2021):  This is the current hospital active medication list Current Facility-Administered Medications  Medication Dose Route Frequency Provider Last Rate Last Admin   atorvastatin (LIPITOR) tablet 40 mg  40 mg Oral Daily Godfrey Pick, MD   40 mg at 09/30/21 2057   carvedilol (COREG) tablet 6.25 mg  6.25 mg Oral QHS Godfrey Pick, MD   6.25 mg at 09/30/21 2100   losartan (COZAAR) tablet 100 mg  100 mg Oral Daily Godfrey Pick, MD   100 mg at 09/30/21 2057   And   hydrochlorothiazide (HYDRODIURIL) tablet 25 mg  25 mg Oral Daily Godfrey Pick, MD   25 mg at 09/30/21 2057   insulin aspart (novoLOG) injection 0-5 Units  0-5 Units Subcutaneous QHS Godfrey Pick, MD       [START ON 10/01/2021] insulin aspart (novoLOG) injection  0-9 Units  0-9 Units Subcutaneous TID WC Godfrey Pick, MD       sertraline (ZOLOFT) tablet 100 mg  100 mg Oral Daily Godfrey Pick, MD   100 mg at 09/30/21 2057   tamsulosin (FLOMAX) capsule 0.4 mg  0.4 mg Oral Daily Godfrey Pick, MD   0.4 mg at 09/30/21 2057   Current Outpatient Medications  Medication Sig Dispense Refill   atorvastatin (LIPITOR) 40 MG tablet Take 40 mg by mouth daily.     carvedilol (COREG) 6.25 MG tablet Take 1 tablet (6.25 mg total) by mouth at bedtime. 30 tablet 0   latanoprost (XALATAN) 0.005 %  ophthalmic solution Apply 1 drop to eye at bedtime.     losartan-hydrochlorothiazide (HYZAAR) 100-25 MG tablet Take 1 tablet by mouth daily.     metFORMIN (GLUCOPHAGE) 500 MG tablet Take 1 tablet (500 mg total) by mouth daily with breakfast. 90 tablet 1   sertraline (ZOLOFT) 100 MG tablet Take 100 mg by mouth daily.     tamsulosin (FLOMAX) 0.4 MG CAPS capsule TAKE ONE CAPSULE BY MOUTH ONCE DAILY *NEED  OFFICE  VISIT* 30 capsule 0   hydrochlorothiazide (HYDRODIURIL) 25 MG tablet Take 25 mg by mouth daily. (Patient not taking: Reported on 09/30/2021)     HYDROcodone-acetaminophen (NORCO/VICODIN) 5-325 MG tablet Take 1 tablet by mouth every 4 (four) hours as needed. (Patient not taking: Reported on 04/22/2018) 5 tablet 0   ibuprofen (ADVIL,MOTRIN) 600 MG tablet Take 1 tablet (600 mg total) by mouth every 6 (six) hours as needed. (Patient not taking: Reported on 04/22/2018) 30 tablet 0   levocetirizine (XYZAL) 5 MG tablet TAKE ONE TABLET BY MOUTH ONCE DAILY IN THE EVENING (Patient not taking: Reported on 09/30/2021) 90 tablet 0   losartan (COZAAR) 100 MG tablet Take 100 mg by mouth daily.  (Patient not taking: Reported on 09/30/2021)     sertraline (ZOLOFT) 50 MG tablet Take 50 mg by mouth every evening. (Patient not taking: Reported on 09/30/2021)     simvastatin (ZOCOR) 40 MG tablet Take 1 tablet (40 mg total) by mouth at bedtime. (Patient not taking: Reported on 09/30/2021) 30 tablet 0     Discharge Medications: Please see discharge summary for a list of discharge medications.  Relevant Imaging Results:  Relevant Lab Results:   Additional Information SSN:  [id number]  Ht:  5'8"     Wt:  175 lbs      BMI:  26.70kg/m2  Ronnetta Currington C Tarpley-Carter, LCSWA

## 2021-09-30 NOTE — ED Notes (Signed)
Pt returned from MRI °

## 2021-09-30 NOTE — ED Notes (Signed)
EDT tried to ambulate pt w/ assistance and noted pt is too weak to stand up. EDT notified this RN who then notified Kommor MD. Verbal order obtained for PT and OT eval and treat.

## 2021-09-30 NOTE — Evaluation (Signed)
Physical Therapy Evaluation Patient Details Name: Martin [name] MRN: [id number] DOB: 1946/01/16 Today's Date: 09/30/2021  History of Present Illness  Pt is a 76 y/o male admitted secondary to increased weakness and inability to stand. PMH includes HTN, DM, glaucoma, and back surgery.  Clinical Impression  Pt admitted secondary to problem above with deficits below. Pt requiring min A +2 to stand and min to min guard to take a few steps at EOB. Pt with increased weakness noted in LLE and increased fatigue which limited ambulation. Pt at increased risk for falls and currently lives alone. Recommencing SNF level therapies to increase independence and safety. Will continue to follow acutely.        Recommendations for follow up therapy are one component of a multi-disciplinary discharge planning process, led by the attending physician.  Recommendations may be updated based on patient status, additional functional criteria and insurance authorization.  Follow Up Recommendations Skilled nursing-short term rehab (<3 hours/day) Can patient physically be transported by private vehicle: Yes    Assistance Recommended at Discharge Frequent or constant Supervision/Assistance  Patient can return home with the following  A little help with walking and/or transfers;A little help with bathing/dressing/bathroom;Assistance with cooking/housework;Assist for transportation;Help with stairs or ramp for entrance    Equipment Recommendations BSC/3in1  Recommendations for Other Services       Functional Status Assessment Patient has had a recent decline in their functional status and demonstrates the ability to make significant improvements in function in a reasonable and predictable amount of time.     Precautions / Restrictions Precautions Precautions: Fall Precaution Comments: Reports hx of falls Restrictions Weight Bearing Restrictions: No      Mobility  Bed Mobility Overal bed mobility: Needs  Assistance Bed Mobility: Supine to Sit, Sit to Supine     Supine to sit: Min guard Sit to supine: Min guard   General bed mobility comments: Min guard for safety    Transfers Overall transfer level: Needs assistance Equipment used: 2 person hand held assist Transfers: Sit to/from Stand Sit to Stand: Min assist, +2 physical assistance, +2 safety/equipment           General transfer comment: Assist for lift assist and steadying.    Ambulation/Gait Ambulation/Gait assistance: Min assist, Min guard Gait Distance (Feet): 5 Feet Assistive device: Rolling walker (2 wheels) Gait Pattern/deviations: Step-through pattern, Decreased weight shift to left Gait velocity: Decreased     General Gait Details: Pt with functional weakness noted in LLE and difficulty taking steps. Min guard to min A for steadying throughout using RW. Further distance limited secondary to weakness and fatigue.  Stairs            Wheelchair Mobility    Modified Rankin (Stroke Patients Only)       Balance Overall balance assessment: Needs assistance Sitting-balance support: No upper extremity supported, Feet supported Sitting balance-Leahy Scale: Fair     Standing balance support: Bilateral upper extremity supported, During functional activity Standing balance-Leahy Scale: Poor Standing balance comment: Reliant on UE support                             Pertinent Vitals/Pain Pain Assessment Pain Assessment: No/denies pain    Home Living Family/patient expects to be discharged to:: Private residence Living Arrangements: Alone Available Help at Discharge: Friend(s);Available PRN/intermittently Type of Home: House Home Access: Stairs to enter Entrance Stairs-Rails: Left Entrance Stairs-Number of Steps: 2   Home Layout:  One level Home Equipment: Advice worker (2 wheels)      Prior Function Prior Level of Function : Independent/Modified Independent              Mobility Comments: Uses RW for mobility for the last 2-3 weeks ADLs Comments: Has someone coming 1X/month for cleaning. Reports independence with ADLs, but reports increased difficulty.     Hand Dominance        Extremity/Trunk Assessment   Upper Extremity Assessment Upper Extremity Assessment: Defer to OT evaluation    Lower Extremity Assessment Lower Extremity Assessment: Generalized weakness;LLE deficits/detail LLE Deficits / Details: Increased weakness noted in LLE compared to R. Grossly 3/5 at hip flexors and 4/5 in knee extensors    Cervical / Trunk Assessment Cervical / Trunk Assessment: Kyphotic  Communication   Communication: No difficulties  Cognition Arousal/Alertness: Lethargic Behavior During Therapy: Flat affect Overall Cognitive Status: No family/caregiver present to determine baseline cognitive functioning                                 General Comments: Pt very sleepy throughout. Slowed processing noted as well. Could be possibly due to medications?        General Comments      Exercises     Assessment/Plan    PT Assessment Patient needs continued PT services  PT Problem List Decreased strength;Decreased activity tolerance;Decreased balance;Decreased mobility;Decreased knowledge of use of DME;Decreased knowledge of precautions;Decreased cognition       PT Treatment Interventions Gait training;DME instruction;Stair training;Functional mobility training;Therapeutic activities;Therapeutic exercise;Balance training;Patient/family education    PT Goals (Current goals can be found in the Care Plan section)  Acute Rehab PT Goals Patient Stated Goal: to feel better PT Goal Formulation: With patient Time For Goal Achievement: 10/14/21 Potential to Achieve Goals: Good    Frequency Min 2X/week     Co-evaluation PT/OT/SLP Co-Evaluation/Treatment: Yes Reason for Co-Treatment: For patient/therapist safety;To address functional/ADL  transfers PT goals addressed during session: Mobility/safety with mobility;Balance         AM-PAC PT "6 Clicks" Mobility  Outcome Measure Help needed turning from your back to your side while in a flat bed without using bedrails?: A Little Help needed moving from lying on your back to sitting on the side of a flat bed without using bedrails?: A Little Help needed moving to and from a bed to a chair (including a wheelchair)?: A Little Help needed standing up from a chair using your arms (e.g., wheelchair or bedside chair)?: A Little Help needed to walk in hospital room?: A Lot Help needed climbing 3-5 steps with a railing? : Total 6 Click Score: 15    End of Session Equipment Utilized During Treatment: Gait belt Activity Tolerance: Patient limited by fatigue Patient left: in bed;with call bell/phone within reach (on stretcher in ED) Nurse Communication: Mobility status;Other (comment) (Pt asking for food) PT Visit Diagnosis: Unsteadiness on feet (R26.81);History of falling (Z91.81);Muscle weakness (generalized) (M62.81)    Time: 2703-5009 PT Time Calculation (min) (ACUTE ONLY): 14 min   Charges:   PT Evaluation $PT Eval Moderate Complexity: 1 Mod          Reuel Derby, PT, DPT  Acute Rehabilitation Services  Office: [phone]   Rudean Hitt 09/30/2021, 12:09 PM

## 2021-09-30 NOTE — ED Notes (Signed)
Pt eating lunch

## 2021-09-30 NOTE — ED Provider Notes (Signed)
  Physical Exam  BP (!) 133/53   Pulse 76   Temp 98.3 F (36.8 C) (Oral)   Resp 18   Wt 79.7 kg   SpO2 97%   BMI 27.50 kg/m   Physical Exam Constitutional:      General: He is not in acute distress.    Appearance: Normal appearance.  HENT:     Head: Normocephalic and atraumatic.     Nose: No congestion or rhinorrhea.  Eyes:     General:        Right eye: No discharge.        Left eye: No discharge.     Extraocular Movements: Extraocular movements intact.     Pupils: Pupils are equal, round, and reactive to light.  Cardiovascular:     Rate and Rhythm: Normal rate and regular rhythm.     Heart sounds: No murmur heard. Pulmonary:     Effort: No respiratory distress.     Breath sounds: No wheezing or rales.  Abdominal:     General: There is no distension.     Tenderness: There is no abdominal tenderness.  Musculoskeletal:        General: Normal range of motion.     Cervical back: Normal range of motion.  Skin:    General: Skin is warm and dry.  Neurological:     General: No focal deficit present.     Mental Status: He is alert.     Motor: Weakness present.     Procedures  Procedures  ED Course / MDM    Medical Decision Making Amount and/or Complexity of Data Reviewed Labs: ordered. Radiology: ordered.  Risk Prescription drug management.   Patient received in handoff.  Initially code stroke but with negative imaging, patient ultimately treated with headache cocktail.  Initial plans were for patient to attempt to ambulate this morning and if unable to do so, will require PT OT consult.  Patient was unable to stand on his own and thus a PT OT consult was placed.  Physical therapy and Occupational Therapy did evaluate the patient at bedside and are recommending SNF placement.  TOC consult placed and disposition pending TOC.       Teressa Lower, MD 09/30/21 [phone]

## 2021-09-30 NOTE — ED Notes (Signed)
PT/OT at bedside.

## 2021-09-30 NOTE — Progress Notes (Addendum)
TOC CSW obtained PASSAR #:  [id number] A  Cinch Ormond Tarpley-Carter, MSW, LCSW-A Pronouns:  She/Her/Hers [facility] HealthTransitions of Care Clinical Social Worker Direct Number:  [phone] [email or web address]

## 2021-09-30 NOTE — ED Notes (Signed)
Received verbal report from Jordan M RN at this time 

## 2021-09-30 NOTE — Discharge Instructions (Signed)
Your evaluation showed no evidence of stroke.  Please follow-up with your primary care provider, return to the emergency department if you have any new or concerning symptoms.

## 2021-09-30 NOTE — ED Notes (Signed)
Kommor MD at bedside

## 2021-10-01 LAB — CBG MONITORING, ED
Glucose-Capillary: 222 mg/dL — ABNORMAL HIGH (ref 70–99)
Glucose-Capillary: 271 mg/dL — ABNORMAL HIGH (ref 70–99)
Glucose-Capillary: 259 mg/dL — ABNORMAL HIGH (ref 70–99)
Glucose-Capillary: 301 mg/dL — ABNORMAL HIGH (ref 70–99)

## 2021-10-01 NOTE — Progress Notes (Signed)
CSW confirmed with Lorenza Chick in admissions at [facility] that they can accept patient when insurance authorization is approved.

## 2021-10-01 NOTE — ED Notes (Signed)
SW at bedside this time talking to pt.

## 2021-10-01 NOTE — ED Notes (Signed)
Fredia Sorrow relative [phone] requesting an update

## 2021-10-01 NOTE — Progress Notes (Signed)
No SNF bed offers at this time.  

## 2021-10-01 NOTE — Progress Notes (Signed)
Patient chose [facility] for SNF placement. Patient did not want to go with [facility] due to the $41 fee daily not covered by insurance. CSW left a message with admissions at [facility]. Patient is [facility] and CSW will start authorization.

## 2021-10-02 LAB — CBG MONITORING, ED
Glucose-Capillary: 238 mg/dL — ABNORMAL HIGH (ref 70–99)
Glucose-Capillary: 226 mg/dL — ABNORMAL HIGH (ref 70–99)
Glucose-Capillary: 202 mg/dL — ABNORMAL HIGH (ref 70–99)

## 2021-10-02 LAB — HEMOGLOBIN A1C
Hgb A1c MFr Bld: 8 % — ABNORMAL HIGH (ref 4.8–5.6)
Mean Plasma Glucose: 182.9 mg/dL

## 2021-10-02 MED ORDER — METFORMIN HCL 500 MG PO TABS: 500.0000 mg | ORAL_TABLET | Freq: Every day | ORAL | Status: DC

## 2021-10-02 MED ORDER — METFORMIN HCL 500 MG PO TABS
500.0000 mg | ORAL_TABLET | Freq: Every day | ORAL | Status: DC
Start: 2021-10-02 — End: 2021-10-02
  Administered 2021-10-02: 500 mg via ORAL
  Filled 2021-10-02: qty 1

## 2021-10-02 NOTE — ED Notes (Signed)
No changes. Out with PTAR.

## 2021-10-02 NOTE — ED Notes (Signed)
Pt alert, NAD, calm, interactive, resps e/u, speaking in clear complete sentences, Speaking on phone. Pending acceptance to [facility].

## 2021-10-02 NOTE — ED Notes (Signed)
[facility] here, preparing for transport, pt's lunch has arrived, pt eating.

## 2021-10-02 NOTE — Progress Notes (Signed)
Ship broker approved. Candace Cruise number [id number]. CSW will speak with admissions this morning in regards to when patient can discharge to [facility].

## 2021-10-02 NOTE — Progress Notes (Signed)
Patient is going to room 501 B at [facility], number for report is [phone]. Starr with admissions stated [facility] can be called at 12:00 PM. Report information given to patients nurse RN, Lacinda Axon.

## 2021-10-02 NOTE — ED Notes (Signed)
ED SW in to see, at [facility].

## 2021-10-02 NOTE — Inpatient Diabetes Management (Signed)
Inpatient Diabetes Program Recommendations  AACE/ADA: New Consensus Statement on Inpatient Glycemic Control (2015)  Target Ranges:  Prepandial:   less than 140 mg/dL      Peak postprandial:   less than 180 mg/dL (1-2 hours)      Critically ill patients:  140 - 180 mg/dL   Lab Results  Component Value Date   GLUCAP 226 (H) 10/02/2021   HGBA1C 6.1 03/05/2015    Review of Glycemic Control  Latest Reference Range & Units 10/01/21 07:37 10/01/21 11:50 10/01/21 16:26 10/01/21 21:57 10/02/21 07:43 10/02/21 10:42  Glucose-Capillary 70 - 99 mg/dL 222 (H) 271 (H) 259 (H) 301 (H) 238 (H) 226 (H)  (H): Data is abnormally high  Diabetes history: type 2 Outpatient Diabetes medications: Metformin 500 mg daily Current orders for Inpatient glycemic control: Novolog 0-9 units correction scale TID, 0-5 units HS scale  Inpatient Diabetes Program Recommendations:   Received diabetes coordinator consult. Recommend adding Semglee 10 units daily (79.7 kg X 1.5 units/kg = 11.9 units) if blood sugars continue to be greater than 180 mg/dl.   Harvel Ricks RN BSN CDE Diabetes Coordinator Pager: [phone]  8am-5pm

## 2022-05-15 ENCOUNTER — Emergency Department (HOSPITAL_COMMUNITY)
Admission: EM | Admit: 2022-05-15 | Discharge: 2022-05-15 | Disposition: A | Discharge status: Home / Self Care | Payer: Medicare HMO | Attending: Student | Admitting: Student

## 2022-05-15 DIAGNOSIS — I10 Essential (primary) hypertension: Secondary | ICD-10-CM | POA: Diagnosis not present

## 2022-05-15 DIAGNOSIS — N390 Urinary tract infection, site not specified: Secondary | ICD-10-CM | POA: Diagnosis not present

## 2022-05-15 DIAGNOSIS — Z7984 Long term (current) use of oral hypoglycemic drugs: Secondary | ICD-10-CM | POA: Diagnosis not present

## 2022-05-15 DIAGNOSIS — Z79899 Other long term (current) drug therapy: Secondary | ICD-10-CM | POA: Diagnosis not present

## 2022-05-15 DIAGNOSIS — E860 Dehydration: Secondary | ICD-10-CM | POA: Diagnosis not present

## 2022-05-15 DIAGNOSIS — R55 Syncope and collapse: Secondary | ICD-10-CM

## 2022-05-15 DIAGNOSIS — E119 Type 2 diabetes mellitus without complications: Secondary | ICD-10-CM | POA: Insufficient documentation

## 2022-05-15 DIAGNOSIS — B9689 Other specified bacterial agents as the cause of diseases classified elsewhere: Secondary | ICD-10-CM | POA: Insufficient documentation

## 2022-05-15 DIAGNOSIS — R531 Weakness: Secondary | ICD-10-CM | POA: Insufficient documentation

## 2022-05-15 LAB — CBC WITH DIFFERENTIAL/PLATELET
Abs Immature Granulocytes: 0.03 10*3/uL (ref 0.00–0.07)
Basophils Absolute: 0.1 10*3/uL (ref 0.0–0.1)
Basophils Relative: 1 %
Eosinophils Absolute: 0.3 10*3/uL (ref 0.0–0.5)
Eosinophils Relative: 4 %
HCT: 40.5 % (ref 39.0–52.0)
Hemoglobin: 13.9 g/dL (ref 13.0–17.0)
Immature Granulocytes: 0 %
Lymphocytes Relative: 18 %
Lymphs Abs: 1.4 10*3/uL (ref 0.7–4.0)
MCH: 32.1 pg (ref 26.0–34.0)
MCHC: 34.3 g/dL (ref 30.0–36.0)
MCV: 93.5 fL (ref 80.0–100.0)
Monocytes Absolute: 0.7 10*3/uL (ref 0.1–1.0)
Monocytes Relative: 9 %
Neutro Abs: 5.4 10*3/uL (ref 1.7–7.7)
Neutrophils Relative %: 68 %
Platelets: 224 10*3/uL (ref 150–400)
RBC: 4.33 MIL/uL (ref 4.22–5.81)
RDW: 12.7 % (ref 11.5–15.5)
WBC: 7.9 10*3/uL (ref 4.0–10.5)
nRBC: 0 % (ref 0.0–0.2)

## 2022-05-15 LAB — COMPREHENSIVE METABOLIC PANEL
ALT: 14 U/L (ref 0–44)
AST: 19 U/L (ref 15–41)
Albumin: 3.6 g/dL (ref 3.5–5.0)
Alkaline Phosphatase: 52 U/L (ref 38–126)
Anion gap: 8 (ref 5–15)
BUN: 32 mg/dL — ABNORMAL HIGH (ref 8–23)
CO2: 26 mmol/L (ref 22–32)
Calcium: 8.6 mg/dL — ABNORMAL LOW (ref 8.9–10.3)
Chloride: 100 mmol/L (ref 98–111)
Creatinine, Ser: 1.29 mg/dL — ABNORMAL HIGH (ref 0.61–1.24)
GFR, Estimated: 57 mL/min — ABNORMAL LOW (ref >60)
Glucose, Bld: 160 mg/dL — ABNORMAL HIGH (ref 70–99)
Potassium: 4.3 mmol/L (ref 3.5–5.1)
Sodium: 134 mmol/L — ABNORMAL LOW (ref 135–145)
Total Bilirubin: 0.4 mg/dL (ref 0.3–1.2)
Total Protein: 7 g/dL (ref 6.5–8.1)

## 2022-05-15 LAB — URINALYSIS, ROUTINE W REFLEX MICROSCOPIC
Bilirubin Urine: NEGATIVE
Glucose, UA: NEGATIVE mg/dL
Hgb urine dipstick: NEGATIVE
Ketones, ur: NEGATIVE mg/dL
Nitrite: NEGATIVE
Protein, ur: NEGATIVE mg/dL
Specific Gravity, Urine: 1.017 (ref 1.005–1.030)
pH: 5 (ref 5.0–8.0)

## 2022-05-15 LAB — TROPONIN I (HIGH SENSITIVITY)
Troponin I (High Sensitivity): 5 ng/L (ref <18)
Troponin I (High Sensitivity): 5 ng/L (ref <18)

## 2022-05-15 MED ORDER — CEFADROXIL 500 MG PO CAPS
500.0000 mg | ORAL_CAPSULE | Freq: Two times a day (BID) | ORAL | Status: DC
  Administered 2022-05-15: 500 mg via ORAL
  Filled 2022-05-15: qty 1

## 2022-05-15 MED ORDER — LACTATED RINGERS IV BOLUS
1000.0000 mL | Freq: Once | INTRAVENOUS | Status: AC
  Administered 2022-05-15: 1000 mL via INTRAVENOUS

## 2022-05-15 MED ORDER — CEFADROXIL 500 MG PO CAPS: 500.0000 mg | ORAL_CAPSULE | Freq: Two times a day (BID) | ORAL | 0 refills | Status: AC

## 2022-05-15 MED ORDER — CEFADROXIL 500 MG PO CAPS: 500.0000 mg | ORAL_CAPSULE | Freq: Two times a day (BID) | ORAL | 0 refills | Status: DC

## 2022-05-15 NOTE — ED Provider Notes (Signed)
[facility] Provider Note  CSN: [id number] Arrival date & time: 05/15/22 1459  Chief Complaint(s) Loss of Consciousness  HPI Martin [name] is a 77 y.o. male with PMH T2DM, HTN, HLD, previous syncopal episodes who presents emergency department for evaluation of an episode of syncope.  Patient states he was at the bank when he had a sudden episode lower extremity weakness bilaterally, lightheadedness and was slowly lowered to the floor by a bank bystander.  He states that he probably had a 2 to 3-minute loss of consciousness but has since returned to normal mental status baseline.  No involuntary loss of bowel or bladder, no shaking seen.  No postictal state.  On arrival to the emergency department he states that he is currently asymptomatic with no numbness, tingling, weakness or other neurologic complaints.  He states that he currently manages his home medications and thinks that he may have taken too many of his blood pressure medications this morning.   Past Medical History Past Medical History:  Diagnosis Date   Allergic rhinitis due to allergen    Anxiety    BPH (benign prostatic hyperplasia)    Caregiver stress    Cataract    DDD (degenerative disc disease), cervical    Depressed    Diabetes mellitus without complication (HCC)    Dyspnea    Glaucoma    Bil.   Hyperlipidemia    Hypertension    Impaired glucose tolerance [id number]   Metabolic syndrome    Myofascial pain dysfunction syndrome    Patient Active Problem List   Diagnosis Date Noted   Bilateral impacted cerumen 08/21/2016   Conductive hearing loss, bilateral 08/21/2016   Gastroesophageal reflux disease without esophagitis 12/24/2015   Need for hepatitis C screening test 09/23/2015   Diabetes mellitus ([place]) 04/28/2013   Acute kidney injury ([place]) 07/08/2012   Caregiver stress [id number]   Metabolic syndrome [id number]   Dyspnea 12/24/2011   Myofascial pain  dysfunction syndrome 07/24/2011   DDD (degenerative disc disease), cervical 06/24/2011   Anxiety 11/21/2010   Depression 11/21/2010   TRANSAMINASES, SERUM, ELEVATED 04/03/2010   Allergic rhinitis due to other allergen 07/18/2008   HYPERLIPIDEMIA 01/11/2008   HYPERTENSION, BENIGN ESSENTIAL 01/11/2008   HYPERTROPHY PROSTATE W/UR OBST & OTH LUTS 01/11/2008   Home Medication(s) Prior to Admission medications   Medication Sig Start Date End Date Taking? Authorizing Provider  atorvastatin (LIPITOR) 40 MG tablet Take 40 mg by mouth daily. 05/29/21   [provider]  carvedilol (COREG) 6.25 MG tablet Take 1 tablet (6.25 mg total) by mouth at bedtime. 11/22/15   Jaynee Eagles, PA-C  hydrochlorothiazide (HYDRODIURIL) 25 MG tablet Take 25 mg by mouth daily. Patient not taking: Reported on 09/30/2021    [provider]  HYDROcodone-acetaminophen (NORCO/VICODIN) 5-325 MG tablet Take 1 tablet by mouth every 4 (four) hours as needed. Patient not taking: Reported on 04/22/2018 04/21/17   Charlann Lange, PA-C  ibuprofen (ADVIL,MOTRIN) 600 MG tablet Take 1 tablet (600 mg total) by mouth every 6 (six) hours as needed. Patient not taking: Reported on 04/22/2018 04/21/17   Charlann Lange, PA-C  latanoprost (XALATAN) 0.005 % ophthalmic solution Apply 1 drop to eye at bedtime.    [provider]  levocetirizine (XYZAL) 5 MG tablet TAKE ONE TABLET BY MOUTH ONCE DAILY IN THE EVENING Patient not taking: Reported on 09/30/2021 05/16/16   Wardell Honour, MD  losartan (COZAAR) 100 MG tablet Take 100 mg by mouth daily.  Patient not taking: Reported on 09/30/2021    [provider]  losartan-hydrochlorothiazide (HYZAAR) 100-25 MG tablet Take 1 tablet by mouth daily. 08/25/21   [provider]  metFORMIN (GLUCOPHAGE) 500 MG tablet Take 1 tablet (500 mg total) by mouth daily with breakfast. 03/05/15   Ezekiel Slocumb, PA-C  sertraline (ZOLOFT) 100 MG tablet Take 100 mg by mouth daily. 06/01/21    [provider]  sertraline (ZOLOFT) 50 MG tablet Take 50 mg by mouth every evening. Patient not taking: Reported on 09/30/2021 03/31/17   [provider]  simvastatin (ZOCOR) 40 MG tablet Take 1 tablet (40 mg total) by mouth at bedtime. Patient not taking: Reported on 09/30/2021 08/14/15   Wardell Honour, MD  tamsulosin (FLOMAX) 0.4 MG CAPS capsule TAKE ONE CAPSULE BY MOUTH ONCE DAILY *NEED  OFFICE  VISIT* 12/24/15   Gale Journey Damaris Hippo, PA-C                                                                                                                                    Past Surgical History Past Surgical History:  Procedure Laterality Date   BACK SURGERY     29+years ago   CARPAL TUNNEL RELEASE Right    SHOULDER ARTHROSCOPY   right shoulder   with bone spurs   TONSILECTOMY, ADENOIDECTOMY, BILATERAL MYRINGOTOMY AND TUBES  1959   Family History Family History  Problem Relation Age of Onset   Alcohol abuse Other    Diabetes Other    Hyperlipidemia Other    Kidney disease Other    Coronary artery disease Other    Heart disease Mother    Dementia Mother    Heart disease Father    Coronary artery disease Father     Social History Social History   Tobacco Use   Smoking status: Never   Smokeless tobacco: Never  Vaping Use   Vaping Use: Never used  Substance Use Topics   Alcohol use: No   Drug use: No   Allergies Enalapril maleate  Review of Systems Review of Systems  Neurological:  Positive for syncope and light-headedness.    Physical Exam Vital Signs  I have reviewed the triage vital signs BP 127/65 (BP Location: Right Arm)   Pulse 60   Temp 97.9 F (36.6 C) (Oral)   Resp 20   Ht '5\' 8"'$  (1.727 m)   Wt 72.6 kg   SpO2 100%   BMI 24.33 kg/m   Physical Exam Constitutional:      General: He is not in acute distress.    Appearance: Normal appearance.  HENT:     Head: Normocephalic and atraumatic.     Nose: No congestion or rhinorrhea.  Eyes:      General:        Right eye: No discharge.        Left eye: No discharge.     Extraocular Movements: Extraocular  movements intact.     Pupils: Pupils are equal, round, and reactive to light.  Cardiovascular:     Rate and Rhythm: Normal rate and regular rhythm.     Heart sounds: No murmur heard. Pulmonary:     Effort: No respiratory distress.     Breath sounds: No wheezing or rales.  Abdominal:     General: There is no distension.     Tenderness: There is no abdominal tenderness.  Musculoskeletal:        General: Normal range of motion.     Cervical back: Normal range of motion.  Skin:    General: Skin is warm and dry.  Neurological:     General: No focal deficit present.     Mental Status: He is alert.     ED Results and Treatments Labs (all labs ordered are listed, but only abnormal results are displayed) Labs Reviewed  COMPREHENSIVE METABOLIC PANEL  CBC WITH DIFFERENTIAL/PLATELET  URINALYSIS, ROUTINE W REFLEX MICROSCOPIC  TROPONIN I (HIGH SENSITIVITY)                                                                                                                          Radiology No results found.  Pertinent labs & imaging results that were available during my care of the patient were reviewed by me and considered in my medical decision making (see MDM for details).  Medications Ordered in ED Medications  lactated ringers bolus 1,000 mL (has no administration in time range)                                                                                                                                     Procedures Procedures  (including critical care time)  Medical Decision Making / ED Course   This patient presents to the ED for concern of ***, this involves an extensive number of treatment options, and is a complaint that carries with it a high risk of complications and morbidity.  The differential diagnosis includes ***  MDM: ***   Additional history  obtained: -Additional history obtained from *** -External records from outside source obtained and reviewed including: Chart review including previous notes, labs, imaging, consultation notes   Lab Tests: -I ordered, reviewed, and interpreted labs.   The pertinent results include:   Labs Reviewed  COMPREHENSIVE METABOLIC PANEL  CBC WITH DIFFERENTIAL/PLATELET  URINALYSIS, ROUTINE W REFLEX MICROSCOPIC  TROPONIN I (HIGH SENSITIVITY)      EKG ***  EKG Interpretation  Date/Time:  Friday May 15 2022 15:06:05 EST Ventricular Rate:  60 PR Interval:  170 QRS Duration: 147 QT Interval:  445 QTC Calculation: 445 R Axis:   115 Text Interpretation: Sinus rhythm Nonspecific intraventricular conduction delay Confirmed by Fredderick Swanger ([id number]) on 05/15/2022 3:12:48 PM         Imaging Studies ordered: I ordered imaging studies including *** I independently visualized and interpreted imaging. I agree with the radiologist interpretation   Medicines ordered and prescription drug management: Meds ordered this encounter  Medications   lactated ringers bolus 1,000 mL    -I have reviewed the patients home medicines and have made adjustments as needed  Critical interventions ***  Consultations Obtained: I requested consultation with the ***,  and discussed lab and imaging findings as well as pertinent plan - they recommend: ***   Cardiac Monitoring: The patient was maintained on a cardiac monitor.  I personally viewed and interpreted the cardiac monitored which showed an underlying rhythm of: ***  Social Determinants of Health:  Factors impacting patients care include: ***   Reevaluation: After the interventions noted above, I reevaluated the patient and found that they have :{resolved/improved/worsened:23923::"improved"}  Co morbidities that complicate the patient evaluation  Past Medical History:  Diagnosis Date   Allergic rhinitis due to allergen    Anxiety    BPH  (benign prostatic hyperplasia)    Caregiver stress    Cataract    DDD (degenerative disc disease), cervical    Depressed    Diabetes mellitus without complication (HCC)    Dyspnea    Glaucoma    Bil.   Hyperlipidemia    Hypertension    Impaired glucose tolerance [id number]   Metabolic syndrome    Myofascial pain dysfunction syndrome       Dispostion: I considered admission for this patient, ***     Final Clinical Impression(s) / ED Diagnoses Final diagnoses:  None     '@PCDICTATION'$ @

## 2022-05-15 NOTE — ED Triage Notes (Signed)
Patient arrived to ED via EMS due to syncopal episode. Patient states he was at the bank talking to the teller when all of sudden he felt weak and as if he couldn't make words. Patient states he knows he got caught and did not hit his head, but that's it. EMS reports patient was out for 3 minutes. Patient is A&Ox4 at this time.

## 2022-05-15 NOTE — ED Notes (Signed)
Called lab to request add on for urine culture to pt existing specimen. Add on completed by lab

## 2022-05-17 LAB — URINE CULTURE: Culture: 20000 — AB

## 2022-05-18 ENCOUNTER — Telehealth (HOSPITAL_BASED_OUTPATIENT_CLINIC_OR_DEPARTMENT_OTHER): Payer: Self-pay | Admitting: *Deleted

## 2022-05-18 NOTE — Telephone Encounter (Signed)
Post ED Visit - Positive Culture Follow-up  Culture report reviewed by antimicrobial stewardship pharmacist: Gate Team '[]'$  Elenor Quinones, Pharm.D. '[]'$  Heide Guile, Pharm.D., BCPS AQ-ID '[]'$  Parks Neptune, Pharm.D., BCPS '[]'$  Alycia Rossetti, Pharm.D., BCPS '[]'$  [place], Pharm.D., BCPS, AAHIVP '[]'$  Legrand Como, Pharm.D., BCPS, AAHIVP '[]'$  Salome Arnt, PharmD, BCPS '[]'$  Johnnette Gourd, PharmD, BCPS '[]'$  Hughes Better, PharmD, BCPS '[]'$  Leeroy Cha, PharmD '[]'$  Laqueta Linden, PharmD, BCPS '[x]'$   Erskine Speed, PharmD  [facility] Team '[]'$  Leodis Sias, PharmD '[]'$  Lindell Spar, PharmD '[]'$  Royetta Asal, PharmD '[]'$  Graylin Shiver, Rph '[]'$  Rema Fendt) Glennon Mac, PharmD '[]'$  Arlyn Dunning, PharmD '[]'$  Netta Cedars, PharmD '[]'$  Dia Sitter, PharmD '[]'$  Leone Haven, PharmD '[]'$  Gretta Arab, PharmD '[]'$  Theodis Shove, PharmD '[]'$  Peggyann Juba, PharmD '[]'$  Reuel Boom, PharmD   Positive urine culture Treated with Cefadroxil, organism sensitive to the same and no further patient follow-up is required at this time.  Martin [name] 05/18/2022, 11:06 AM

## 2022-11-08 ENCOUNTER — Encounter (HOSPITAL_COMMUNITY): Payer: Self-pay

## 2022-11-08 ENCOUNTER — Emergency Department (HOSPITAL_COMMUNITY): Payer: Medicare HMO

## 2022-11-08 ENCOUNTER — Inpatient Hospital Stay (HOSPITAL_COMMUNITY)
Admission: EM | Admit: 2022-11-08 | Discharge: 2022-11-13 | DRG: 243 | Disposition: A | Discharge status: Skilled Nursing Facility | Payer: Medicare HMO | Attending: Cardiology | Admitting: Cardiology

## 2022-11-08 ENCOUNTER — Other Ambulatory Visit: Payer: Self-pay

## 2022-11-08 DIAGNOSIS — I10 Essential (primary) hypertension: Secondary | ICD-10-CM | POA: Diagnosis present

## 2022-11-08 DIAGNOSIS — Z79899 Other long term (current) drug therapy: Secondary | ICD-10-CM

## 2022-11-08 DIAGNOSIS — N400 Enlarged prostate without lower urinary tract symptoms: Secondary | ICD-10-CM | POA: Diagnosis present

## 2022-11-08 DIAGNOSIS — E119 Type 2 diabetes mellitus without complications: Secondary | ICD-10-CM | POA: Diagnosis present

## 2022-11-08 DIAGNOSIS — R5381 Other malaise: Secondary | ICD-10-CM | POA: Diagnosis present

## 2022-11-08 DIAGNOSIS — Z888 Allergy status to other drugs, medicaments and biological substances status: Secondary | ICD-10-CM

## 2022-11-08 DIAGNOSIS — R001 Bradycardia, unspecified: Secondary | ICD-10-CM | POA: Diagnosis present

## 2022-11-08 DIAGNOSIS — I452 Bifascicular block: Secondary | ICD-10-CM | POA: Diagnosis present

## 2022-11-08 DIAGNOSIS — F32A Depression, unspecified: Secondary | ICD-10-CM | POA: Diagnosis present

## 2022-11-08 DIAGNOSIS — E785 Hyperlipidemia, unspecified: Secondary | ICD-10-CM | POA: Diagnosis present

## 2022-11-08 DIAGNOSIS — I959 Hypotension, unspecified: Secondary | ICD-10-CM | POA: Diagnosis not present

## 2022-11-08 DIAGNOSIS — I442 Atrioventricular block, complete: Secondary | ICD-10-CM | POA: Diagnosis present

## 2022-11-08 DIAGNOSIS — J90 Pleural effusion, not elsewhere classified: Secondary | ICD-10-CM | POA: Diagnosis present

## 2022-11-08 DIAGNOSIS — Z751 Person awaiting admission to adequate facility elsewhere: Secondary | ICD-10-CM

## 2022-11-08 DIAGNOSIS — J9811 Atelectasis: Secondary | ICD-10-CM | POA: Diagnosis present

## 2022-11-08 DIAGNOSIS — Z7984 Long term (current) use of oral hypoglycemic drugs: Secondary | ICD-10-CM

## 2022-11-08 DIAGNOSIS — F419 Anxiety disorder, unspecified: Secondary | ICD-10-CM | POA: Diagnosis present

## 2022-11-08 LAB — HEPATIC FUNCTION PANEL
ALT: 18 U/L (ref 0–44)
AST: 20 U/L (ref 15–41)
Albumin: 3.7 g/dL (ref 3.5–5.0)
Alkaline Phosphatase: 47 U/L (ref 38–126)
Bilirubin, Direct: 0.1 mg/dL (ref 0.0–0.2)
Indirect Bilirubin: 0.9 mg/dL (ref 0.3–0.9)
Total Bilirubin: 1 mg/dL (ref 0.3–1.2)
Total Protein: 7.5 g/dL (ref 6.5–8.1)

## 2022-11-08 LAB — TROPONIN I (HIGH SENSITIVITY)
Troponin I (High Sensitivity): 16 ng/L (ref <18)
Troponin I (High Sensitivity): 17 ng/L (ref <18)

## 2022-11-08 LAB — BASIC METABOLIC PANEL
Anion gap: 13 (ref 5–15)
BUN: 17 mg/dL (ref 8–23)
CO2: 22 mmol/L (ref 22–32)
Calcium: 9 mg/dL (ref 8.9–10.3)
Chloride: 103 mmol/L (ref 98–111)
Creatinine, Ser: 1.09 mg/dL (ref 0.61–1.24)
GFR, Estimated: >60 mL/min (ref >60)
Glucose, Bld: 150 mg/dL — ABNORMAL HIGH (ref 70–99)
Potassium: 3.8 mmol/L (ref 3.5–5.1)
Sodium: 138 mmol/L (ref 135–145)

## 2022-11-08 LAB — CBC
HCT: 39.3 % (ref 39.0–52.0)
Hemoglobin: 13.1 g/dL (ref 13.0–17.0)
MCH: 31.4 pg (ref 26.0–34.0)
MCHC: 33.3 g/dL (ref 30.0–36.0)
MCV: 94.2 fL (ref 80.0–100.0)
Platelets: 200 10*3/uL (ref 150–400)
RBC: 4.17 MIL/uL — ABNORMAL LOW (ref 4.22–5.81)
RDW: 13.5 % (ref 11.5–15.5)
WBC: 10.3 10*3/uL (ref 4.0–10.5)
nRBC: 0 % (ref 0.0–0.2)

## 2022-11-08 LAB — SURGICAL PCR SCREEN
MRSA, PCR: NEGATIVE
Staphylococcus aureus: POSITIVE — AB

## 2022-11-08 LAB — HEMOGLOBIN A1C
Hgb A1c MFr Bld: 6.6 % — ABNORMAL HIGH (ref 4.8–5.6)
Mean Plasma Glucose: 142.72 mg/dL

## 2022-11-08 LAB — GLUCOSE, CAPILLARY
Glucose-Capillary: 162 mg/dL — ABNORMAL HIGH (ref 70–99)
Glucose-Capillary: 120 mg/dL — ABNORMAL HIGH (ref 70–99)

## 2022-11-08 LAB — CBG MONITORING, ED: Glucose-Capillary: 123 mg/dL — ABNORMAL HIGH (ref 70–99)

## 2022-11-08 LAB — BRAIN NATRIURETIC PEPTIDE: B Natriuretic Peptide: 499.7 pg/mL — ABNORMAL HIGH (ref 0.0–100.0)

## 2022-11-08 LAB — TSH: TSH: 5.348 u[IU]/mL — ABNORMAL HIGH (ref 0.350–4.500)

## 2022-11-08 MED ORDER — SODIUM CHLORIDE 0.9% FLUSH: 3.0000 mL | INTRAVENOUS | Status: DC | PRN

## 2022-11-08 MED ORDER — INSULIN ASPART 100 UNIT/ML IJ SOLN
0.0000 [IU] | Freq: Three times a day (TID) | INTRAMUSCULAR | Status: DC
  Administered 2022-11-08: 3 [IU] via SUBCUTANEOUS
  Administered 2022-11-09 – 2022-11-12 (×7): 2 [IU] via SUBCUTANEOUS

## 2022-11-08 MED ORDER — ACETAMINOPHEN 325 MG PO TABS
650.0000 mg | ORAL_TABLET | ORAL | Status: DC | PRN
  Administered 2022-11-10: 650 mg via ORAL
  Filled 2022-11-08: qty 2

## 2022-11-08 MED ORDER — SODIUM CHLORIDE 0.9 % IV SOLN
250.0000 mL | INTRAVENOUS | Status: DC | PRN
  Administered 2022-11-09: 250 mL via INTRAVENOUS

## 2022-11-08 MED ORDER — ATORVASTATIN CALCIUM 40 MG PO TABS
40.0000 mg | ORAL_TABLET | Freq: Every day | ORAL | Status: DC
  Administered 2022-11-08 – 2022-11-13 (×6): 40 mg via ORAL
  Filled 2022-11-08 (×6): qty 1

## 2022-11-08 MED ORDER — TAMSULOSIN HCL 0.4 MG PO CAPS
0.4000 mg | ORAL_CAPSULE | Freq: Every day | ORAL | Status: DC
  Administered 2022-11-08 – 2022-11-12 (×5): 0.4 mg via ORAL
  Filled 2022-11-08 (×5): qty 1

## 2022-11-08 MED ORDER — ENOXAPARIN SODIUM 40 MG/0.4ML IJ SOSY
40.0000 mg | PREFILLED_SYRINGE | INTRAMUSCULAR | Status: DC
  Administered 2022-11-08: 40 mg via SUBCUTANEOUS
  Filled 2022-11-08: qty 0.4

## 2022-11-08 MED ORDER — LOSARTAN POTASSIUM-HCTZ 100-25 MG PO TABS: 1.0000 | ORAL_TABLET | Freq: Every day | ORAL | Status: DC

## 2022-11-08 MED ORDER — SODIUM CHLORIDE 0.9 % IV SOLN
250.0000 mL | INTRAVENOUS | Status: DC
  Administered 2022-11-09: 250 mL via INTRAVENOUS

## 2022-11-08 MED ORDER — ALPRAZOLAM 0.25 MG PO TABS
0.2500 mg | ORAL_TABLET | Freq: Two times a day (BID) | ORAL | Status: DC | PRN
  Administered 2022-11-10: 0.25 mg via ORAL
  Filled 2022-11-08: qty 1

## 2022-11-08 MED ORDER — INSULIN ASPART 100 UNIT/ML IJ SOLN: 0.0000 [IU] | Freq: Every day | INTRAMUSCULAR | Status: DC

## 2022-11-08 MED ORDER — SODIUM CHLORIDE 0.9% FLUSH
3.0000 mL | Freq: Two times a day (BID) | INTRAVENOUS | Status: DC
  Administered 2022-11-08 – 2022-11-11 (×5): 3 mL via INTRAVENOUS

## 2022-11-08 MED ORDER — SERTRALINE HCL 100 MG PO TABS
100.0000 mg | ORAL_TABLET | Freq: Every day | ORAL | Status: DC
  Administered 2022-11-08 – 2022-11-13 (×6): 100 mg via ORAL
  Filled 2022-11-08 (×6): qty 1

## 2022-11-08 MED ORDER — LOSARTAN POTASSIUM 50 MG PO TABS
100.0000 mg | ORAL_TABLET | Freq: Every day | ORAL | Status: DC
  Administered 2022-11-08 – 2022-11-11 (×4): 100 mg via ORAL
  Filled 2022-11-08 (×4): qty 2

## 2022-11-08 MED ORDER — HYDROCHLOROTHIAZIDE 25 MG PO TABS
25.0000 mg | ORAL_TABLET | Freq: Every day | ORAL | Status: DC
  Administered 2022-11-08 – 2022-11-13 (×6): 25 mg via ORAL
  Filled 2022-11-08 (×6): qty 1

## 2022-11-08 MED ORDER — ONDANSETRON HCL 4 MG/2ML IJ SOLN: 4.0000 mg | Freq: Four times a day (QID) | INTRAMUSCULAR | Status: DC | PRN

## 2022-11-08 MED ORDER — SODIUM CHLORIDE 0.9% FLUSH
3.0000 mL | Freq: Two times a day (BID) | INTRAVENOUS | Status: DC
  Administered 2022-11-08 – 2022-11-09 (×2): 3 mL via INTRAVENOUS

## 2022-11-08 MED ORDER — NITROGLYCERIN 0.4 MG SL SUBL: 0.4000 mg | SUBLINGUAL_TABLET | SUBLINGUAL | Status: DC | PRN

## 2022-11-08 MED ORDER — SODIUM CHLORIDE 0.9 % IV SOLN: INTRAVENOUS | Status: DC

## 2022-11-08 MED ORDER — LATANOPROST 0.005 % OP SOLN
1.0000 [drp] | Freq: Every day | OPHTHALMIC | Status: DC
  Administered 2022-11-08 – 2022-11-12 (×5): 1 [drp] via OPHTHALMIC
  Filled 2022-11-08 (×2): qty 2.5

## 2022-11-08 MED ORDER — ZOLPIDEM TARTRATE 5 MG PO TABS
5.0000 mg | ORAL_TABLET | Freq: Every evening | ORAL | Status: DC | PRN
  Administered 2022-11-09: 5 mg via ORAL
  Filled 2022-11-08: qty 1

## 2022-11-08 NOTE — ED Notes (Signed)
ED TO INPATIENT HANDOFF REPORT  ED Nurse Name and Phone #: Einar Grad [phone]  S Name/Age/Gender Martin [name] 77 y.o. male Room/Bed: 004C/004C  Code Status   Code Status: Full Code  Home/SNF/Other Home Patient oriented to: self, place, time, and situation Is this baseline? Yes   Triage Complete: Triage complete  Chief Complaint CHB (complete heart block) (HCC) [I44.2]  Triage Note Pt [facility] from home c/o headache, weakness, and gait instability been going on for months but worse today. Went to bathroom and could not get up, legs feel heavy bilaterally. Issues with memory for a few weeks but is progressing.   Hx DM   CBG 142 181/79 48 hr    Allergies Allergies  Allergen Reactions   Enalapril Maleate     Patient unsure of allergy.     Level of Care/Admitting Diagnosis ED Disposition     ED Disposition  Admit   Condition  --   Comment  Hospital Area: [facility] [[id number]]  Level of Care: Telemetry Cardiac [[id number]]  May admit patient to [facility] or [facility] if equivalent level of care is available:: No  Covid Evaluation: Asymptomatic - no recent exposure (last 10 days) testing not required  Diagnosis: CHB (complete heart block) [facility]) [[id number]]  Admitting Physician: Lanier Prude [[id number]]  Attending Physician: Lanier Prude [phone]  Certification:: I certify this patient will need inpatient services for at least 2 midnights  Expected Medical Readiness: 11/11/2022          B Medical/Surgery History Past Medical History:  Diagnosis Date   Allergic rhinitis due to allergen    Anxiety    BPH (benign prostatic hyperplasia)    Caregiver stress    Cataract    DDD (degenerative disc disease), cervical    Depressed    Diabetes mellitus without complication (HCC)    Dyspnea    Glaucoma    Bil.   Hyperlipidemia    Hypertension    Impaired glucose tolerance 11/20/2010   Metabolic syndrome    Myofascial pain dysfunction  syndrome    Past Surgical History:  Procedure Laterality Date   BACK SURGERY     29+years ago   CARPAL TUNNEL RELEASE Right    SHOULDER ARTHROSCOPY   right shoulder   with bone spurs   TONSILECTOMY, ADENOIDECTOMY, BILATERAL MYRINGOTOMY AND TUBES  1959     A IV Location/Drains/Wounds Patient Lines/Drains/Airways Status     Active Line/Drains/Airways     Name Placement date Placement time Site Days   Peripheral IV 11/08/22 20 G 1" Posterior;Right Hand 11/08/22  0708  Hand  less than 1   Peripheral IV 11/08/22 Left Antecubital 11/08/22  --  Antecubital  less than 1            Intake/Output Last 24 hours No intake or output data in the 24 hours ending 11/08/22 1340  Labs/Imaging Results for orders placed or performed during the hospital encounter of 11/08/22 (from the past 48 hour(s))  Basic metabolic panel     Status: Abnormal   Collection Time: 11/08/22  7:26 AM  Result Value Ref Range   Sodium 138 135 - 145 mmol/L   Potassium 3.8 3.5 - 5.1 mmol/L   Chloride 103 98 - 111 mmol/L   CO2 22 22 - 32 mmol/L   Glucose, Bld 150 (H) 70 - 99 mg/dL    Comment: Glucose reference range applies only to samples taken after fasting for at least 8 hours.  BUN 17 8 - 23 mg/dL   Creatinine, Ser 4.74 0.61 - 1.24 mg/dL   Calcium 9.0 8.9 - 25.9 mg/dL   GFR, Estimated >56 >38 mL/min    Comment: (NOTE) Calculated using the CKD-EPI Creatinine Equation (2021)    Anion gap 13 5 - 15    Comment: Performed at [facility], [address], [place], [place] [address]  CBC     Status: Abnormal   Collection Time: 11/08/22  7:26 AM  Result Value Ref Range   WBC 10.3 4.0 - 10.5 K/uL   RBC 4.17 (L) 4.22 - 5.81 MIL/uL   Hemoglobin 13.1 13.0 - 17.0 g/dL   HCT 32.9 51.8 - 84.1 %   MCV 94.2 80.0 - 100.0 fL   MCH 31.4 26.0 - 34.0 pg   MCHC 33.3 30.0 - 36.0 g/dL   RDW 66.0 63.0 - 16.0 %   Platelets 200 150 - 400 K/uL   nRBC 0.0 0.0 - 0.2 %    Comment: Performed at [facility],  [address], [place], [place] [address]  Troponin I (High Sensitivity)     Status: None   Collection Time: 11/08/22  7:26 AM  Result Value Ref Range   Troponin I (High Sensitivity) 16 <18 ng/L    Comment: (NOTE) Elevated high sensitivity troponin I (hsTnI) values and significant  changes across serial measurements may suggest ACS but many other  chronic and acute conditions are known to elevate hsTnI results.  Refer to the "Links" section for chest pain algorithms and additional  guidance. Performed at [facility], [address], [place], [place] [address]   Brain natriuretic peptide     Status: Abnormal   Collection Time: 11/08/22  7:26 AM  Result Value Ref Range   B Natriuretic Peptide 499.7 (H) 0.0 - 100.0 pg/mL    Comment: Performed at [facility], [address] [place], [place] [address]  Troponin I (High Sensitivity)     Status: None   Collection Time: 11/08/22  9:32 AM  Result Value Ref Range   Troponin I (High Sensitivity) 17 <18 ng/L    Comment: (NOTE) Elevated high sensitivity troponin I (hsTnI) values and significant  changes across serial measurements may suggest ACS but many other  chronic and acute conditions are known to elevate hsTnI results.  Refer to the "Links" section for chest pain algorithms and additional  guidance. Performed at [facility], [address] [place], [place] [address]   Hemoglobin A1c     Status: Abnormal   Collection Time: 11/08/22  9:32 AM  Result Value Ref Range   Hgb A1c MFr Bld 6.6 (H) 4.8 - 5.6 %    Comment: (NOTE) Pre diabetes:          5.7%-6.4%  Diabetes:              >6.4%  Glycemic control for   <7.0% adults with diabetes    Mean Plasma Glucose 142.72 mg/dL    Comment: Performed at [facility], [address], [place], [place] [address]  CBG monitoring, ED     Status: Abnormal   Collection Time: 11/08/22 11:53 AM  Result Value Ref Range   Glucose-Capillary 123 (H) 70 - 99 mg/dL    Comment:  Glucose reference range applies only to samples taken after fasting for at least 8 hours.   DG Chest Port 1 View  Result Date: 11/08/2022 CLINICAL DATA:  77 year old male with history of chest pain. EXAM: PORTABLE CHEST 1 VIEW COMPARISON:  Chest x-ray 03/13/2021. FINDINGS: Transcutaneous defibrillator pads project over the central aspect of the chest. Lung volumes are normal. Poorly defined opacity in the left lung base may reflect atelectasis and/or consolidation, likely with small left pleural effusion. Right lung appears clear. No appreciable pneumothorax. No evidence of pulmonary edema. Heart size is normal. Mediastinal contours are obscured. IMPRESSION: 1. Atelectasis and/or consolidation in the left lung base with small left pleural effusion. Electronically Signed   By: Trudie Reed M.D.   On: 11/08/2022 08:04    Pending Labs Unresulted Labs (From admission, onward)     Start     Ordered   11/15/22 0500  Creatinine, serum  (enoxaparin (LOVENOX)    CrCl >/= 30 ml/min)  Weekly,   R     Comments: while on enoxaparin therapy    11/08/22 1320   11/08/22 1321  TSH  Once,   R        11/08/22 1320   11/08/22 1321  Hepatic function panel  Once,   R        11/08/22 1320   Signed and Held  Surgical PCR screen  (Screening)  Once,   R        Signed and Held            Vitals/Pain Today's Vitals   11/08/22 1130 11/08/22 1145 11/08/22 1215 11/08/22 1315  BP: (!) 154/57 (!) 159/53 (!) 161/66 (!) 176/66  Pulse: (!) 44 (!) 44 (!) 45 (!) 46  Resp: 13 19 15 19   Temp:      TempSrc:      SpO2: 99% 98% 98% 99%  Weight:      Height:      PainSc:        Isolation Precautions No active isolations  Medications Medications  nitroGLYCERIN (NITROSTAT) SL tablet 0.4 mg (has no administration in time range)  acetaminophen (TYLENOL) tablet 650 mg (has no administration in time range)  ondansetron (ZOFRAN) injection 4 mg (has no administration in time range)  zolpidem (AMBIEN) tablet 5 mg  (has no administration in time range)  enoxaparin (LOVENOX) injection 40 mg (40 mg Subcutaneous Given 11/08/22 1337)  sodium chloride flush (NS) 0.9 % injection 3 mL (3 mLs Intravenous Given 11/08/22 1338)  sodium chloride flush (NS) 0.9 % injection 3 mL (has no administration in time range)  0.9 %  sodium chloride infusion (has no administration in time range)  ALPRAZolam (XANAX) tablet 0.25 mg (has no administration in time range)  insulin aspart (novoLOG) injection 0-15 Units ( Subcutaneous Patient Refused/Not Given 11/08/22 1155)  insulin aspart (novoLOG) injection 0-5 Units (has no administration in time range)  atorvastatin (LIPITOR) tablet 40 mg (40 mg Oral Given 11/08/22 1059)  sertraline (ZOLOFT) tablet 100 mg (100 mg Oral Given 11/08/22 1059)  tamsulosin (FLOMAX) capsule 0.4 mg (has no administration in time range)  latanoprost (XALATAN) 0.005 % ophthalmic solution 1 drop (has no administration in time range)  losartan (COZAAR) tablet 100 mg (100 mg Oral Given 11/08/22 1059)    And  hydrochlorothiazide (HYDRODIURIL) tablet 25 mg (25 mg Oral Given 11/08/22 1059)    Mobility walks with device (cane)     Focused Assessments Cardiac Assessment Handoff:  Cardiac Rhythm: Heart block Lab Results  Component Value Date   TROPONINI <0.03 12/21/2014   No results found for: "DDIMER" Does the Patient currently have chest pain? No    R Recommendations:  See Admitting Provider Note  Report given to:   Additional Notes: presented for weakness, difficulty ambulating; admitted for complete heart block (bradycardic in the 40s); AAOx4

## 2022-11-08 NOTE — ED Notes (Signed)
Spoke with lab able to add on, BNP

## 2022-11-08 NOTE — ED Triage Notes (Signed)
Pt [facility] from home c/o headache, weakness, and gait instability been going on for months but worse today. Went to bathroom and could not get up, legs feel heavy bilaterally. Issues with memory for a few weeks but is progressing.   Hx DM   CBG 142 181/79 48 hr

## 2022-11-08 NOTE — ED Provider Notes (Signed)
[place] EMERGENCY DEPARTMENT AT [facility] Provider Note   CSN: [id number] Arrival date & time: 11/08/22  0707     History  Chief Complaint  Patient presents with   Weakness   Headache    Martin [name] is a 77 y.o. male.  Pt is a 77 yo male with pmhx significant for hld, htn, bph, anxiety, depression and DM.  Pt said he has not been feeling well for months.  He woke up this am and felt unsteady and did not feel right.  He denies any pain.       Home Medications Prior to Admission medications   Medication Sig Start Date End Date Taking? Authorizing Provider  carvedilol (COREG) 6.25 MG tablet Take 1 tablet (6.25 mg total) by mouth at bedtime. 11/22/15  Yes Wallis Bamberg, PA-C  hydrochlorothiazide (HYDRODIURIL) 25 MG tablet Take 25 mg by mouth daily.   Yes [provider]  latanoprost (XALATAN) 0.005 % ophthalmic solution Place 1 drop into both eyes at bedtime.   Yes [provider]  losartan (COZAAR) 100 MG tablet Take 100 mg by mouth daily.   Yes [provider]  metFORMIN (GLUCOPHAGE) 500 MG tablet Take 1 tablet (500 mg total) by mouth daily with breakfast. 03/05/15  Yes Dorna Leitz, PA-C  sertraline (ZOLOFT) 100 MG tablet Take 100 mg by mouth daily. 06/01/21  Yes [provider]  sertraline (ZOLOFT) 50 MG tablet Take 50 mg by mouth every evening. 03/31/17  Yes [provider]  simvastatin (ZOCOR) 40 MG tablet Take 1 tablet (40 mg total) by mouth at bedtime. 08/14/15  Yes Ethelda Chick, MD      Allergies    Enalapril maleate    Review of Systems   Review of Systems  Neurological:  Positive for weakness.  All other systems reviewed and are negative.   Physical Exam Updated Vital Signs BP (!) 148/60 (BP Location: Right Arm)   Pulse (!) 53   Temp 98.3 F (36.8 C) (Axillary)   Resp (!) 28   Ht 5\' 8"  (1.727 m)   Wt 70.3 kg   SpO2 99%   BMI 23.57 kg/m  Physical Exam Vitals and nursing note reviewed.   Constitutional:      Appearance: He is well-developed.  HENT:     Head: Normocephalic and atraumatic.     Mouth/Throat:     Mouth: Mucous membranes are moist.     Pharynx: Oropharynx is clear.  Eyes:     Extraocular Movements: Extraocular movements intact.     Pupils: Pupils are equal, round, and reactive to light.  Cardiovascular:     Rate and Rhythm: Bradycardia present.     Heart sounds: Normal heart sounds.  Pulmonary:     Effort: Pulmonary effort is normal.  Abdominal:     Palpations: Abdomen is soft.  Musculoskeletal:        General: Normal range of motion.     Cervical back: Normal range of motion and neck supple.  Skin:    General: Skin is warm.  Neurological:     Mental Status: He is alert and oriented to person, place, and time.  Psychiatric:        Mood and Affect: Mood normal.        Speech: Speech normal.        Behavior: Behavior normal.     ED Results / Procedures / Treatments   Labs (all labs ordered are listed, but only abnormal results  are displayed) Labs Reviewed  BASIC METABOLIC PANEL - Abnormal; Notable for the following components:      Result Value   Glucose, Bld 150 (*)    All other components within normal limits  CBC - Abnormal; Notable for the following components:   RBC 4.17 (*)    All other components within normal limits  BRAIN NATRIURETIC PEPTIDE - Abnormal; Notable for the following components:   B Natriuretic Peptide 499.7 (*)    All other components within normal limits  HEMOGLOBIN A1C - Abnormal; Notable for the following components:   Hgb A1c MFr Bld 6.6 (*)    All other components within normal limits  TSH - Abnormal; Notable for the following components:   TSH 5.348 (*)    All other components within normal limits  CBG MONITORING, ED - Abnormal; Notable for the following components:   Glucose-Capillary 123 (*)    All other components within normal limits  SURGICAL PCR SCREEN  HEPATIC FUNCTION PANEL  TROPONIN I (HIGH  SENSITIVITY)  TROPONIN I (HIGH SENSITIVITY)    EKG EKG Interpretation Date/Time:  Sunday November 08 2022 07:27:49 EDT Ventricular Rate:  47 PR Interval:    QRS Duration:  144 QT Interval:  501 QTC Calculation: 443 R Axis:   -74  Text Interpretation: Complete AV block with wide QRS complex RBBB and LAFB EKG repeated to verify and it does look like CHB Confirmed by Jacalyn Lefevre [phone]) on 11/08/2022 7:52:19 AM  Radiology DG Chest Port 1 View  Result Date: 11/08/2022 CLINICAL DATA:  77 year old male with history of chest pain. EXAM: PORTABLE CHEST 1 VIEW COMPARISON:  Chest x-ray 03/13/2021. FINDINGS: Transcutaneous defibrillator pads project over the central aspect of the chest. Lung volumes are normal. Poorly defined opacity in the left lung base may reflect atelectasis and/or consolidation, likely with small left pleural effusion. Right lung appears clear. No appreciable pneumothorax. No evidence of pulmonary edema. Heart size is normal. Mediastinal contours are obscured. IMPRESSION: 1. Atelectasis and/or consolidation in the left lung base with small left pleural effusion. Electronically Signed   By: Trudie Reed M.D.   On: 11/08/2022 08:04    Procedures Procedures    Medications Ordered in ED Medications  nitroGLYCERIN (NITROSTAT) SL tablet 0.4 mg (has no administration in time range)  acetaminophen (TYLENOL) tablet 650 mg (has no administration in time range)  ondansetron (ZOFRAN) injection 4 mg (has no administration in time range)  zolpidem (AMBIEN) tablet 5 mg (has no administration in time range)  enoxaparin (LOVENOX) injection 40 mg (40 mg Subcutaneous Given 11/08/22 1337)  sodium chloride flush (NS) 0.9 % injection 3 mL (3 mLs Intravenous Given 11/08/22 1338)  sodium chloride flush (NS) 0.9 % injection 3 mL (has no administration in time range)  0.9 %  sodium chloride infusion (has no administration in time range)  ALPRAZolam (XANAX) tablet 0.25 mg (has no administration  in time range)  insulin aspart (novoLOG) injection 0-15 Units ( Subcutaneous Patient Refused/Not Given 11/08/22 1155)  insulin aspart (novoLOG) injection 0-5 Units (has no administration in time range)  0.9 %  sodium chloride infusion (has no administration in time range)  sodium chloride flush (NS) 0.9 % injection 3 mL (has no administration in time range)  sodium chloride flush (NS) 0.9 % injection 3 mL (has no administration in time range)  0.9 %  sodium chloride infusion (has no administration in time range)  atorvastatin (LIPITOR) tablet 40 mg (40 mg Oral Given 11/08/22 1059)  sertraline (ZOLOFT) tablet  100 mg (100 mg Oral Given 11/08/22 1059)  tamsulosin (FLOMAX) capsule 0.4 mg (has no administration in time range)  latanoprost (XALATAN) 0.005 % ophthalmic solution 1 drop (has no administration in time range)  losartan (COZAAR) tablet 100 mg (100 mg Oral Given 11/08/22 1059)    And  hydrochlorothiazide (HYDRODIURIL) tablet 25 mg (25 mg Oral Given 11/08/22 1059)    ED Course/ Medical Decision Making/ A&P                                 Medical Decision Making Amount and/or Complexity of Data Reviewed Labs: ordered. Radiology: ordered.  Risk Prescription drug management. Decision regarding hospitalization.   This patient presents to the ED for concern of weakness, this involves an extensive number of treatment options, and is a complaint that carries with it a high risk of complications and morbidity.  The differential diagnosis includes infection, cardiac abn, electrolyte abn   Co morbidities that complicate the patient evaluation  hld, htn, bph, anxiety, depression and DM   Additional history obtained:  Additional history obtained from epic chart review External records from outside source obtained and reviewed including EMS report   Lab Tests:  I Ordered, and personally interpreted labs.  The pertinent results include:  cbc nl, bmp nl, trop nl   Imaging Studies  ordered:  I ordered imaging studies including cxr  I independently visualized and interpreted imaging which showed  Atelectasis and/or consolidation in the left lung base with small  left pleural effusion.   I agree with the radiologist interpretation   Cardiac Monitoring:  The patient was maintained on a cardiac monitor.  I personally viewed and interpreted the cardiac monitored which showed an underlying rhythm of: CHB   Medicines ordered and prescription drug management:   I have reviewed the patients home medicines and have made adjustments as needed   Critical Interventions:  Cards consult   Consultations Obtained:  I requested consultation with the cardiologist (Dr. Lalla Brothers) ,  and discussed lab and imaging findings as well as pertinent plan - he will see pt   Problem List / ED Course:  CHB:  Cards will admit.  BB washout.  Possible pacemaker placement.   Reevaluation:  After the interventions noted above, I reevaluated the patient and found that they have :improved   Social Determinants of Health:  Lives at home   Dispostion:  After consideration of the diagnostic results and the patients response to treatment, I feel that the patent would benefit from admission.          Final Clinical Impression(s) / ED Diagnoses Final diagnoses:  Complete heart block [facility])    Rx / DC Orders ED Discharge Orders     None         Jacalyn Lefevre, MD 11/08/22 1547

## 2022-11-08 NOTE — Progress Notes (Signed)
  Patient Name: Martin [name] Date of Encounter: 11/09/2022   Primary Cardiologist: None Electrophysiologist: None  Interval Summary   The patient is doing well today.  At this time, the patient denies chest pain, shortness of breath, or any new concerns.  Vital Signs    Vitals:   11/08/22 2300 11/09/22 0330 11/09/22 0500 11/09/22 0752  BP: (!) 137/50 (!) 135/49  (!) 140/53  Pulse: (!) 57 (!) 50  (!) 48  Resp: 20 16  19   Temp: 98.2 F (36.8 C) 97.6 F (36.4 C)  98.1 F (36.7 C)  TempSrc: Oral Oral  Oral  SpO2: 97% 97%  98%  Weight:  80.2 kg 80.4 kg   Height:        Intake/Output Summary (Last 24 hours) at 11/09/2022 0824 Last data filed at 11/09/2022 0500 Gross per 24 hour  Intake 426 ml  Output 1350 ml  Net -924 ml   Filed Weights   11/08/22 1500 11/09/22 0330 11/09/22 0500  Weight: 76.4 kg 80.2 kg 80.4 kg    Physical Exam    GEN- The patient is well appearing, alert and oriented x 3 today.   Lungs- Clear to ausculation bilaterally, normal work of breathing Cardiac- Regular rate and rhythm, no murmurs, rubs or gallops GI- soft, NT, ND, + BS Extremities- no clubbing or cyanosis. No edema  Telemetry    CHB 40-50s (personally reviewed)  Hospital Course    Martin [name] is a 77 year old man with a history of diabetes, hypertension, hyperlipidemia who is being admitted today for general malaise.  He was found to be in complete heart block with a ventricular escape in the 40s in the emergency department.  He has a history of syncope and was recently admitted in March with a syncopal episode.  [place] was at the bank when he suddenly became lightheaded and had to be lowered to the ground by another Counsellor.   Assessment & Plan    CHB Conduction system disease Echo pending to assess LV function Pt is not sure last dose of coreg, but thinks it was Saturday night.   Explained risks, benefits, and alternatives to PPM implantation, including but not limited to  bleeding, infection, pneumothorax, pericardial effusion, lead dislodgement, heart attack, stroke, or death.  Pt verbalized understanding and agrees to proceed.      For questions or updates, please contact [facility] Please consult [email or web address] for contact info under Cardiology/STEMI.  Signed, Graciella Freer, PA-C  11/09/2022, 8:24 AM

## 2022-11-08 NOTE — ED Notes (Signed)
Pt found sitting at end of bed because he needed to pee.  I assisted him to stand at end of bed and use urinal then we carefully moved around bed to reposition with 1-assist

## 2022-11-08 NOTE — H&P (Signed)
   Electrophysiology H&P   Patient ID: JAYMASON COLQUHOUN MRN: [id number]; DOB: 1946-01-22  Admit date: 11/08/2022 Date of Consult: 11/08/2022  PCP:  Tracey Harries, MD   History of Present Illness:   Mr. Doolittle is a 77 year old man with a history of diabetes, hypertension, hyperlipidemia who is being admitted today for general malaise.  He was found to be in complete heart block with a ventricular escape in the 40s in the emergency department.  He has a history of syncope and was recently admitted in March with a syncopal episode.  He was at the bank when he suddenly became lightheaded and had to be lowered to the ground by another Counsellor.  Today he tells me that he lives alone.  He is active and walks at [facility].  He tells me that he generally has felt poorly over the last few days or weeks.  No recent illnesses or changes in medications.  He did not have a syncopal episode.  He is a poor historian.  Past medical, surgical, social and family history reviewed.  ROS:  Please see the history of present illness.  All other ROS reviewed and negative.     Physical Exam/Data:   Vitals:   11/08/22 0717 11/08/22 0730  BP: (!) 164/71 (!) 164/73  Pulse: (!) 47 (!) 47  Resp: 13 11  Temp: 98.1 F (36.7 C)   SpO2: 100% 99%    General: Elderly, anxious.  No distress. Cardiac: Bradycardic, regular rhythm.  Warm on exam. Lungs:  clear to auscultation bilaterally, no wheezing, rhonchi or rales  Psych:  Normal affect   EKG:  The EKG was personally reviewed and demonstrates: Sinus rhythm, complete heart block.  Right bundle branch block.  Left anterior fascicular block.  May 15, 2022 EKG shows sinus rhythm, incomplete right bundle branch block  Chest x-ray personally reviewed.  No prior sternotomy, pacer pads in position, prominence of the interstitial vascular structures  Telemetry:  Telemetry was personally reviewed and demonstrates: Complete heart block with a ventricular  escape in the 40s    Assessment and Plan:   Mr. Hopkin is a 77 year old man with a history of hypertension, hyperlipidemia and baseline conduction system disease who presents to the hospital with malaise and was found to be in complete heart block with a ventricular escape in the 40s.  He has a history of multiple syncopal episodes that sound arrhythmic in nature.  #Complete heart block #Conduction system disease -Hold Coreg, monitor telemetry for 24 hours before finalizing decision for pacemaker.  If no improvement, favor implant given baseline conduction system disease with multiple syncopal episodes -Echo to assess LV function - Keep n.p.o. after midnight  Risks, benefits, alternatives to PPM implantation were discussed in detail with the patient today. The patient understands that the risks include but are not limited to bleeding, infection, pneumothorax, perforation, tamponade, vascular damage, renal failure, MI, stroke, death, and lead dislodgement and wishes to proceed.  We will therefore schedule device implantation at the next available time.  #Hypertension Follow blood pressures off Coreg    Aisea Bouldin T. Lalla Brothers, MD, [facility], [facility] Cardiac Electrophysiology

## 2022-11-09 ENCOUNTER — Inpatient Hospital Stay (HOSPITAL_COMMUNITY): Payer: Medicare HMO

## 2022-11-09 ENCOUNTER — Encounter (HOSPITAL_COMMUNITY)
Admission: EM | Disposition: A | Discharge status: Skilled Nursing Facility | Payer: Self-pay | Source: Home / Self Care | Attending: Cardiology

## 2022-11-09 ENCOUNTER — Other Ambulatory Visit (HOSPITAL_COMMUNITY): Payer: Medicare HMO

## 2022-11-09 DIAGNOSIS — I442 Atrioventricular block, complete: Secondary | ICD-10-CM | POA: Diagnosis not present

## 2022-11-09 HISTORY — PX: PACEMAKER IMPLANT: EP1218

## 2022-11-09 LAB — ECHOCARDIOGRAM COMPLETE
Height: 68 in
S' Lateral: 3 cm
Weight: 2836 oz

## 2022-11-09 LAB — GLUCOSE, CAPILLARY
Glucose-Capillary: 117 mg/dL — ABNORMAL HIGH (ref 70–99)
Glucose-Capillary: 121 mg/dL — ABNORMAL HIGH (ref 70–99)
Glucose-Capillary: 142 mg/dL — ABNORMAL HIGH (ref 70–99)
Glucose-Capillary: 151 mg/dL — ABNORMAL HIGH (ref 70–99)
Glucose-Capillary: 172 mg/dL — ABNORMAL HIGH (ref 70–99)

## 2022-11-09 SURGERY — PACEMAKER IMPLANT

## 2022-11-09 MED ORDER — CEFAZOLIN SODIUM-DEXTROSE 2-4 GM/100ML-% IV SOLN
INTRAVENOUS | Status: AC
  Filled 2022-11-09: qty 100

## 2022-11-09 MED ORDER — HEPARIN (PORCINE) IN NACL 1000-0.9 UT/500ML-% IV SOLN
INTRAVENOUS | Status: DC | PRN
  Administered 2022-11-09: 500 mL

## 2022-11-09 MED ORDER — MUPIROCIN 2 % EX OINT
1.0000 | TOPICAL_OINTMENT | Freq: Two times a day (BID) | CUTANEOUS | Status: DC
  Administered 2022-11-09 – 2022-11-13 (×9): 1 via NASAL
  Filled 2022-11-09 (×3): qty 22

## 2022-11-09 MED ORDER — SODIUM CHLORIDE 0.9 % IV SOLN
INTRAVENOUS | Status: AC
  Administered 2022-11-09: 80 mg
  Filled 2022-11-09: qty 2

## 2022-11-09 MED ORDER — CEFAZOLIN SODIUM-DEXTROSE 2-4 GM/100ML-% IV SOLN
2.0000 g | INTRAVENOUS | Status: AC
  Administered 2022-11-09: 2 g via INTRAVENOUS

## 2022-11-09 MED ORDER — CHLORHEXIDINE GLUCONATE CLOTH 2 % EX PADS
6.0000 | MEDICATED_PAD | Freq: Every day | CUTANEOUS | Status: DC
  Administered 2022-11-09 – 2022-11-12 (×4): 6 via TOPICAL

## 2022-11-09 MED ORDER — LIDOCAINE HCL (PF) 1 % IJ SOLN
INTRAMUSCULAR | Status: DC | PRN
  Administered 2022-11-09: 60 mL

## 2022-11-09 MED ORDER — SODIUM CHLORIDE 0.9 % IV SOLN: INTRAVENOUS | Status: DC

## 2022-11-09 MED ORDER — CEFAZOLIN SODIUM-DEXTROSE 1-4 GM/50ML-% IV SOLN
1.0000 g | Freq: Four times a day (QID) | INTRAVENOUS | Status: AC
  Administered 2022-11-09 – 2022-11-10 (×3): 1 g via INTRAVENOUS
  Filled 2022-11-09 (×4): qty 50

## 2022-11-09 MED ORDER — SODIUM CHLORIDE 0.9 % IV SOLN
80.0000 mg | INTRAVENOUS | Status: AC
  Filled 2022-11-09: qty 2

## 2022-11-09 SURGICAL SUPPLY — 14 items
CABLE SURGICAL S-101-97-12 (CABLE) ×1 IMPLANT
CATH CPS LOCATOR 3D MED (CATHETERS) IMPLANT
HELIX LOCKING TOOL (MISCELLANEOUS) ×1
LEAD ULTIPACE 52 LPA1231/52 (Lead) IMPLANT
LEAD ULTIPACE 65 LPA1231/65 (Lead) IMPLANT
PACEMAKER ASSURITY DR-RF (Pacemaker) IMPLANT
PAD DEFIB RADIO PHYSIO CONN (PAD) ×1 IMPLANT
SHEATH 7FR PRELUDE SNAP 13 (SHEATH) IMPLANT
SHEATH 9FR PRELUDE SNAP 13 (SHEATH) IMPLANT
SHEATH PROBE COVER 6X72 (BAG) IMPLANT
SLITTER AGILIS HISPRO (INSTRUMENTS) IMPLANT
TOOL HELIX LOCKING (MISCELLANEOUS) IMPLANT
TRAY PACEMAKER INSERTION (PACKS) ×1 IMPLANT
WIRE HI TORQ VERSACORE-J 145CM (WIRE) IMPLANT

## 2022-11-09 NOTE — Discharge Summary (Addendum)
ELECTROPHYSIOLOGY PROCEDURE DISCHARGE SUMMARY    Patient ID: Martin [name],  MRN: [id number], DOB/AGE: December 31, 1945 77 y.o.  Admit date: 11/08/2022 Discharge date: 11/13/2022   Primary Care Physician: Tracey Harries, MD  Primary Cardiologist: None  Electrophysiologist: Dr. Lalla Brothers   Primary Discharge Diagnosis:  CHB status post pacemaker implantation this admission  Secondary Discharge Diagnosis:  DM2 HTN HLD Deconditioning  Allergies  Allergen Reactions   Enalapril Maleate     Patient unsure of allergy.      Procedures This Admission:  1.  Implantation of a Abbott Dual Chamber PPM on 11/09/22 by Dr. Lalla Brothers. The patient received a Abbott Assurity [id number] with a Abbott Ultipace 1231-52 right atrial lead and a Abbott Ultipace 1231-65 right ventricular lead.  There were no immediate post procedure complications.   2.  CXR on 11/13/22  demonstrated no pneumothorax status post device implantation.       Brief HPI: Martin [name] is a 77 y.o. male was admitted for general malaise in the setting of CHB and electrophysiology team asked to see for consideration of PPM implantation.  Past medical history includes above.  The patient has had AV block without reversible causes identified.  Risks, benefits, and alternatives to PPM implantation were reviewed with the patient who wished to proceed.   Hospital Course:  The patient was admitted and underwent implantation of a Abbott dual chamber PPM with details as outlined above.  He was monitored on telemetry overnight which demonstrated appropriate pacing.  Left chest was without hematoma or ecchymosis.  The device was interrogated and found to be functioning normally.  CXR was obtained and demonstrated no pneumothorax status post device implantation.  Wound care, arm mobility, and restrictions were reviewed with the patient.    The patient was noted to have mild intermittent confusion requiring re-direction to maintain  restrictions. He was assessed by PT/OT and determined to need significant assistance at home, vs SNF.  With no close family, SNF was recommended.   Anticoagulation resumption This patient is not on anticoagulation     Physical Exam: Vitals:   11/12/22 1955 11/13/22 0007 11/13/22 0300 11/13/22 0357  BP: 138/62 (!) 141/73  125/64  Pulse: 60 66  62  Resp:      Temp: 97.9 F (36.6 C) 98.1 F (36.7 C)  98.3 F (36.8 C)  TempSrc: Oral Oral  Oral  SpO2: 98% 98% 95% 93%  Weight:    73.1 kg  Height:        GEN- NAD. A&O x 3.  HEENT: Normocephalic, atraumatic Lungs- CTAB, Normal effort.  Heart- RRR, No M/G/R.  GI- Soft, NT, ND.  Extremities- No clubbing, cyanosis, or edema;  Skin- warm and dry, no rash or lesion, left chest without hematoma/ecchymosis  Discharge Medications:  Allergies as of 11/13/2022       Reactions   Enalapril Maleate    Patient unsure of allergy.         Medication List     STOP taking these medications    carvedilol 6.25 MG tablet Commonly known as: COREG       TAKE these medications    hydrochlorothiazide 25 MG tablet Commonly known as: HYDRODIURIL Take 25 mg by mouth daily. Notes to patient: Take for excess fluids    latanoprost 0.005 % ophthalmic solution Commonly known as: XALATAN Place 1 drop into both eyes at bedtime.   losartan 25 MG tablet Commonly known as: COZAAR Take 1 tablet (25 mg total) by mouth  daily. What changed:  medication strength how much to take   metFORMIN 500 MG tablet Commonly known as: GLUCOPHAGE Take 1 tablet (500 mg total) by mouth daily with breakfast. Notes to patient: Take for Blood sugar   metoprolol succinate 25 MG 24 hr tablet Commonly known as: Toprol XL Take 0.5 tablets (12.5 mg total) by mouth at bedtime.   sertraline 50 MG tablet Commonly known as: ZOLOFT Take 50 mg by mouth every evening. Notes to patient: Take for Mood stabilization    sertraline 100 MG tablet Commonly known as:  ZOLOFT Take 100 mg by mouth daily.   simvastatin 40 MG tablet Commonly known as: ZOCOR Take 1 tablet (40 mg total) by mouth at bedtime. Notes to patient: Take for Cholesterol          Disposition:    Contact information for follow-up providers     [facility] Follow up.   Specialty: Cardiology Why: on 9/5 at 0840 for post pacemaker follow up Contact information: [address] [place] [address] [phone]        Tracey Harries, MD. Nyra Capes on 11/18/2022.   Specialty: Family Medicine Why: @1 :50pm Contact information: [address] [place] [address] [phone]              Contact information for after-discharge care     Destination     [facility] .   Service: Skilled Nursing Contact information: [address] Test Update Address [place] [address] [phone]                     Duration of Discharge Encounter: Greater than 30 minutes including physician time.  Dustin Flock, PA-C  11/13/2022 7:46 AM

## 2022-11-09 NOTE — Progress Notes (Signed)
   Rounding Note    Patient Name: DUSHAUN NISBET Date of Encounter: 11/09/2022  [facility] Cardiologist: None   Subjective   NAEO. Planning for PPM today.  Vital Signs    Vitals:   11/09/22 0330 11/09/22 0500 11/09/22 0752 11/09/22 1121  BP: (!) 135/49  (!) 140/53 (!) 136/58  Pulse: (!) 50  (!) 48 (!) 46  Resp: 16  19 18   Temp: 97.6 F (36.4 C)  98.1 F (36.7 C) 97.7 F (36.5 C)  TempSrc: Oral  Oral Oral  SpO2: 97%  98% 95%  Weight: 80.2 kg 80.4 kg    Height:        Intake/Output Summary (Last 24 hours) at 11/09/2022 1458 Last data filed at 11/09/2022 1102 Gross per 24 hour  Intake 533.67 ml  Output 1350 ml  Net -816.33 ml      11/09/2022    5:00 AM 11/09/2022    3:30 AM 11/08/2022    3:00 PM  Last 3 Weights  Weight (lbs) 177 lb 4 oz 176 lb 12.9 oz 168 lb 6.9 oz  Weight (kg) 80.4 kg 80.2 kg 76.4 kg      Telemetry    Personally Reviewed  ECG    Personally Reviewed  Physical Exam   GEN: No acute distress.   Cardiac: RRR, no murmurs, rubs, or gallops.  Respiratory: Clear to auscultation bilaterally. Psych: Normal affect   Assessment & Plan    Mr. Disanti is a 77 year old man with a history of hypertension, hyperlipidemia and baseline conduction system disease who presents to the hospital with malaise and was found to be in complete heart block with a ventricular escape in the 40s.  He has a history of multiple syncopal episodes that sound arrhythmic in nature.   #Complete heart block #Conduction system disease Keep NPO Plan for pacemaker this afternoon.   Risks, benefits, alternatives to PPM implantation were discussed in detail with the patient today. The patient understands that the risks include but are not limited to bleeding, infection, pneumothorax, perforation, tamponade, vascular damage, renal failure, MI, stroke, death, and lead dislodgement and wishes to proceed.  We will therefore schedule device implantation at the next available  time.   #Hypertension Follow blood pressures off Coreg    Zeppelin Beckstrand T. Lalla Brothers, MD, [facility], [facility] Cardiac Electrophysiology

## 2022-11-09 NOTE — Progress Notes (Signed)
*  PRELIMINARY RESULTS* Echocardiogram 2D Echocardiogram has been performed.  Leda Roys RDCS 11/09/2022, 8:46 AM

## 2022-11-09 NOTE — Plan of Care (Signed)
  Problem: Cardiac: Goal: Ability to achieve and maintain adequate cardiovascular perfusion will improve Outcome: Progressing   

## 2022-11-09 NOTE — TOC Initial Note (Signed)
Transition of Care [facility]) - Initial/Assessment Note    Patient Details  Name: Martin [name] MRN: [id number] Date of Birth: January 02, 1946  Transition of Care [facility]) CM/SW Contact:    Leone Haven, RN Phone Number: 11/09/2022, 12:13 PM  Clinical Narrative:                 From home alone,  has PCP and insurance on file, states has no HH services in place at this time , he has a cane, walker and walking stick per patient.  States he will have to call a uber to transport him home at [employer] and he has no  support system, states gets medications from [facility] and he prefers to get his medications from there because they deliver to him, but if he is started on any new meds would like TOC to fill the new meds.  Pta ambulatory with cane or walker.  Expected Discharge Plan: Home/Self Care Barriers to Discharge: Continued Medical Work up   Patient Goals and CMS Choice Patient states their goals for this hospitalization and ongoing recovery are:: return home   Choice offered to / list presented to : NA      Expected Discharge Plan and Services In-house Referral: NA Discharge Planning Services: CM Consult Post Acute Care Choice: NA Living arrangements for the past 2 months: Single Family Home                 DME Arranged: N/A DME Agency: NA       HH Arranged: NA          Prior Living Arrangements/Services Living arrangements for the past 2 months: Single Family Home Lives with:: Self Patient language and need for interpreter reviewed:: Yes Do you feel safe going back to the place where you live?: Yes      Need for Family Participation in Patient Care: No (Comment) Care giver support system in place?: No (comment) Current home services: DME (cane, walker, walking stick) Criminal Activity/Legal Involvement Pertinent to Current Situation/Hospitalization: No - Comment as needed  Activities of Daily Living Home Assistive Devices/Equipment: Walker (specify  type), Cane (specify quad or straight) ADL Screening (condition at time of admission) Patient's cognitive ability adequate to safely complete daily activities?: Yes Is the patient deaf or have difficulty hearing?: No Does the patient have difficulty seeing, even when wearing glasses/contacts?: No Does the patient have difficulty concentrating, remembering, or making decisions?: No Patient able to express need for assistance with ADLs?: Yes Does the patient have difficulty dressing or bathing?: Yes Independently performs ADLs?: No Communication: Independent Dressing (OT): Needs assistance Is this a change from baseline?: Pre-admission baseline Grooming: Needs assistance Is this a change from baseline?: Pre-admission baseline Feeding: Independent Bathing: Needs assistance Is this a change from baseline?: Pre-admission baseline Toileting: Needs assistance Is this a change from baseline?: Pre-admission baseline In/Out Bed: Independent Walks in Home: Independent Does the patient have difficulty walking or climbing stairs?: Yes Weakness of Legs: Both Weakness of Arms/Hands: Both  Permission Sought/Granted Permission sought to share information with : Case Manager Permission granted to share information with : Yes, Verbal Permission Granted              Emotional Assessment   Attitude/Demeanor/Rapport: Engaged Affect (typically observed): Appropriate Orientation: : Oriented to Self, Oriented to Place, Oriented to  Time, Oriented to Situation Alcohol / Substance Use: Not Applicable Psych Involvement: No (comment)  Admission diagnosis:  Complete heart block (HCC) [I44.2] CHB (  complete heart block) [facility]) [I44.2] Patient Active Problem List   Diagnosis Date Noted   CHB (complete heart block) (HCC) 11/08/2022   Bilateral impacted cerumen 08/21/2016   Conductive hearing loss, bilateral 08/21/2016   Gastroesophageal reflux disease without esophagitis 12/24/2015   Need for hepatitis  C screening test 09/23/2015   Diabetes mellitus (HCC) 04/28/2013   Acute kidney injury (HCC) 07/08/2012   Caregiver stress 04/24/2012   Metabolic syndrome 12/24/2011   Dyspnea 12/24/2011   Myofascial pain dysfunction syndrome 07/24/2011   DDD (degenerative disc disease), cervical 06/24/2011   Anxiety 11/21/2010   Depression 11/21/2010   TRANSAMINASES, SERUM, ELEVATED 04/03/2010   Allergic rhinitis due to other allergen 07/18/2008   HYPERLIPIDEMIA 01/11/2008   HYPERTENSION, BENIGN ESSENTIAL 01/11/2008   HYPERTROPHY PROSTATE W/UR OBST & OTH LUTS 01/11/2008   PCP:  Tracey Harries, MD Pharmacy:   [facility] [place], [place] - [address] [place] [address] Phone: [phone] Fax: [phone]  [facility] - [place], [place] - [address] [place] [address] Phone: [phone] Fax: [phone]  [facility] [place], [place] - [address] [place] [address] Phone: [phone] Fax: [phone]     Social Determinants of Health (SDOH) Social History: SDOH Screenings   Food Insecurity: No Food Insecurity (11/08/2022)  Housing: Low Risk  (11/08/2022)  Transportation Needs: No Transportation Needs (11/08/2022)  Utilities: Not At Risk (11/08/2022)  Financial Resource Strain: Low Risk  (08/24/2022)   Received from [facility]  Physical Activity: Insufficiently Active (08/24/2022)   Received from [facility]  Social Connections: Somewhat Isolated (08/24/2022)   Received from [facility]  Stress: Stress Concern Present (08/24/2022)   Received from [facility]  Tobacco Use: Low Risk  (11/08/2022)   SDOH Interventions:     Readmission Risk Interventions     No data to display

## 2022-11-09 NOTE — Discharge Instructions (Signed)
After Your Pacemaker   You have a Abbott Pacemaker  ACTIVITY Do not lift your arm above shoulder height for 1 week after your procedure. After 7 days, you may progress as below.  You should remove your sling 24 hours after your procedure, unless otherwise instructed by your provider.     Monday November 16, 2022  Tuesday November 17, 2022 Wednesday November 18, 2022 Thursday November 19, 2022   Do not lift, push, pull, or carry anything over 10 pounds with the affected arm until 6 weeks (Monday December 21, 2022 ) after your procedure.   You may drive AFTER your wound check, unless you have been told otherwise by your provider.   Ask your healthcare provider when you can go back to work   INCISION/Dressing If you are on a blood thinner such as Coumadin, Xarelto, Eliquis, Plavix, or Pradaxa please confirm with your provider when this should be resumed.   If large square, outer bandage is left in place, this can be removed after 24 hours from your procedure. Do not remove steri-strips or glue as below.   If a PRESSURE DRESSING (a bulky dressing that usually goes up over your shoulder) was applied or left in place, please follow instructions given by your provider on when to return to have this removed.   Monitor your Pacemaker site for redness, swelling, and drainage. Call the device clinic at [phone] if you experience these symptoms or fever/chills.  If your incision is sealed with Steri-strips or staples, you may shower 7 days after your procedure or when told by your provider. Do not remove the steri-strips or let the shower hit directly on your site. You may wash around your site with soap and water.    If you were discharged in a sling, please do not wear this during the day more than 48 hours after your surgery unless otherwise instructed. This may increase the risk of stiffness and soreness in your shoulder.   Avoid lotions, ointments, or perfumes over your incision until it  is well-healed.  You may use a hot tub or a pool AFTER your wound check appointment if the incision is completely closed.  Pacemaker Alerts:  Some alerts are vibratory and others beep. These are NOT emergencies. Please call our office to let us know. If this occurs at night or on weekends, it can wait until the next business day. Send a remote transmission.  If your device is capable of reading fluid status (for heart failure), you will be offered monthly monitoring to review this with you.   DEVICE MANAGEMENT Remote monitoring is used to monitor your pacemaker from home. This monitoring is scheduled every 91 days by our office. It allows [place] to keep an eye on the functioning of your device to ensure it is working properly. You will routinely see your Electrophysiologist annually (more often if necessary).   You should receive your ID card for your new device in 4-8 weeks. Keep this card with you at all times once received. Consider wearing a medical alert bracelet or necklace.  Your Pacemaker may be MRI compatible. This will be discussed at your next office visit/wound check.  You should avoid contact with strong electric or magnetic fields.   Do not use amateur (ham) radio equipment or electric (arc) welding torches. MP3 player headphones with magnets should not be used. Some devices are safe to use if held at least 12 inches (30 cm) from your Pacemaker. These include power tools, lawn  mowers, and speakers. If you are unsure if something is safe to use, ask your health care provider.  When using your cell phone, hold it to the ear that is on the opposite side from the Pacemaker. Do not leave your cell phone in a pocket over the Pacemaker.  You may safely use electric blankets, heating pads, computers, and microwave ovens.  Call the office right away if: You have chest pain. You feel more short of breath than you have felt before. You feel more light-headed than you have felt before. Your  incision starts to open up.  This information is not intended to replace advice given to you by your health care provider. Make sure you discuss any questions you have with your health care provider.

## 2022-11-10 ENCOUNTER — Inpatient Hospital Stay (HOSPITAL_COMMUNITY): Payer: Medicare HMO

## 2022-11-10 ENCOUNTER — Encounter (HOSPITAL_COMMUNITY): Payer: Self-pay | Admitting: Cardiology

## 2022-11-10 DIAGNOSIS — I442 Atrioventricular block, complete: Secondary | ICD-10-CM | POA: Diagnosis not present

## 2022-11-10 LAB — GLUCOSE, CAPILLARY
Glucose-Capillary: 130 mg/dL — ABNORMAL HIGH (ref 70–99)
Glucose-Capillary: 114 mg/dL — ABNORMAL HIGH (ref 70–99)
Glucose-Capillary: 116 mg/dL — ABNORMAL HIGH (ref 70–99)
Glucose-Capillary: 138 mg/dL — ABNORMAL HIGH (ref 70–99)

## 2022-11-10 NOTE — TOC Transition Note (Addendum)
Transition of Care [facility]) - CM/SW Discharge Note   Patient Details  Name: Martin [name] MRN: [id number] Date of Birth: Feb 15, 1946  [facility]) CM/SW Contact:  Leone Haven, RN Phone Number: 11/10/2022, 11:25 AM   Clinical Narrative:    Patient is for dc today, he will need a cab voucher for ast to get home.  Physical therapy to see prior to dc. DC cancel. Awaiting pt eval .     Barriers to Discharge: Continued Medical Work up   Patient Goals and CMS Choice   Choice offered to / list presented to : NA  Discharge Placement                         Discharge Plan and Services Additional resources added to the After Visit Summary for   In-house Referral: NA Discharge Planning Services: CM Consult Post Acute Care Choice: NA          DME Arranged: N/A DME Agency: NA       HH Arranged: NA          Social Determinants of Health (SDOH) Interventions SDOH Screenings   Food Insecurity: No Food Insecurity (11/08/2022)  Housing: Low Risk  (11/08/2022)  Transportation Needs: No Transportation Needs (11/08/2022)  Utilities: Not At Risk (11/08/2022)  Financial Resource Strain: Low Risk  (08/24/2022)   Received from [facility]  Physical Activity: Insufficiently Active (08/24/2022)   Received from [facility]  Social Connections: Somewhat Isolated (08/24/2022)   Received from [facility]  Stress: Stress Concern Present (08/24/2022)   Received from [facility]  Tobacco Use: Low Risk  (11/08/2022)     Readmission Risk Interventions     No data to display

## 2022-11-10 NOTE — Progress Notes (Signed)
Physical Therapy Treatment Patient Details Name: Martin [name] MRN: [id number] DOB: June 21, 1945 Today's Date: 11/10/2022   History of Present Illness 77 yo male [facility] with headache, weakness, gait instability. Pt found to have complete heart block, s/p pacemaker placement 8/26. PMH includes DM, HTN, HLD.    PT Comments  Pt presents with generalized weakness, impaired balance, impaired safety awareness s/p pacemaker implantation, unsteady gait, and decreased activity tolerance. Pt to benefit from acute PT to address deficits. Pt ambulated hallway distance with use of RW, overall requiring close guard to light physical assist given weakness and impaired standing given cannot use LUE for pushing/pulling. Pt requires significant cueing for safe mobility s/p pacemaker implantation, pt stating "I have been doing things a certain way for 77 years, it may be difficult" to follow precautions. Pt lives alone, PT concerned for pt safety with mobility at home and would suggest increased support from family as well as HH services.  PT to progress mobility as tolerated, and will continue to follow acutely.      If plan is discharge home, recommend the following: A little help with walking and/or transfers;A little help with bathing/dressing/bathroom;Assist for transportation;Direct supervision/assist for medications management;Help with stairs or ramp for entrance;Supervision due to cognitive status   Can travel by private vehicle        Equipment Recommendations  None recommended by PT    Recommendations for Other Services       Precautions / Restrictions Precautions Precautions: Fall;ICD/Pacemaker Precaution Comments: administered pacemaker precautions handout and reviewed precautions for LUE     Mobility  Bed Mobility Overal bed mobility: Needs Assistance Bed Mobility: Supine to Sit, Sit to Supine     Supine to sit: Supervision, HOB elevated Sit to supine: Supervision, HOB  elevated   General bed mobility comments: cues for sequencing and safe mechanics    Transfers Overall transfer level: Needs assistance Equipment used: Rolling walker (2 wheels) Transfers: Sit to/from Stand Sit to Stand: Min assist           General transfer comment: assist for initial rise from EOB, stand attempts practiced x2 pt requiring AP momentum-building rocking and cues for nonuse LUE    Ambulation/Gait Ambulation/Gait assistance: Contact guard assist Gait Distance (Feet): 150 Feet Assistive device: Rolling walker (2 wheels) Gait Pattern/deviations: Step-through pattern, Decreased stride length, Trunk flexed, Drifts right/left Gait velocity: decr     General Gait Details: close guard for safety, cues for resting LUE on RW and avoid pushing/pulling, needs RW for balance   Stairs             Wheelchair Mobility     Tilt Bed    Modified Rankin (Stroke Patients Only)       Balance Overall balance assessment: Needs assistance Sitting-balance support: No upper extremity supported, Feet supported Sitting balance-Leahy Scale: Good     Standing balance support: During functional activity, Single extremity supported, Reliant on assistive device for balance Standing balance-Leahy Scale: Poor                              Cognition Arousal: Alert Behavior During Therapy: WFL for tasks assessed/performed Overall Cognitive Status: No family/caregiver present to determine baseline cognitive functioning Area of Impairment: Attention, Following commands, Safety/judgement, Problem solving                   Current Attention Level: Sustained   Following Commands: Follows one  step commands with increased time Safety/Judgement: Decreased awareness of deficits, Decreased awareness of safety   Problem Solving: Slow processing, Decreased initiation, Difficulty sequencing, Requires tactile cues, Requires verbal cues General Comments: Pt tangential  and difficult focusing on task at hand. pt acknowledges precautions PT reviews, but has difficulty with application (attempts pushing/pulling with LUE, leaves L hand on RW when moving stand to sit requiring PT cues to correct to prevent breaking precautions). Pt states when he gets home he plans to just lay in his recliner, but lives alone and does not understand needs that will arise (eating, toileting, etc). questionable historian, gives PT and OT different answers for questions        Exercises      General Comments        Pertinent Vitals/Pain Pain Assessment Pain Assessment: No/denies pain    Home Living Family/patient expects to be discharged to:: Private residence Living Arrangements: Alone Available Help at Discharge: Friend(s);Family;Available PRN/intermittently Type of Home: House Home Access: Stairs to enter Entrance Stairs-Rails: Left Entrance Stairs-Number of Steps: 2   Home Layout: One level Home Equipment: Agricultural consultant (2 wheels);Shower seat;Cane - single point      Prior Function            PT Goals (current goals can now be found in the care plan section) Acute Rehab PT Goals PT Goal Formulation: With patient Time For Goal Achievement: 11/24/22 Potential to Achieve Goals: Good    Frequency    Min 1X/week      PT Plan      Co-evaluation              AM-PAC PT "6 Clicks" Mobility   Outcome Measure  Help needed turning from your back to your side while in a flat bed without using bedrails?: A Little Help needed moving from lying on your back to sitting on the side of a flat bed without using bedrails?: A Little Help needed moving to and from a bed to a chair (including a wheelchair)?: A Little Help needed standing up from a chair using your arms (e.g., wheelchair or bedside chair)?: A Little Help needed to walk in hospital room?: A Little Help needed climbing 3-5 steps with a railing? : A Lot 6 Click Score: 17    End of Session  Equipment Utilized During Treatment: Gait belt Activity Tolerance: Patient tolerated treatment well Patient left: in bed;with call bell/phone within reach;with bed alarm set Nurse Communication: Mobility status PT Visit Diagnosis: Other abnormalities of gait and mobility (R26.89);Muscle weakness (generalized) (M62.81)     Time: 4132-4401 PT Time Calculation (min) (ACUTE ONLY): 31 min  Charges:    $Therapeutic Activity: 8-22 mins PT General Charges $$ ACUTE PT VISIT: 1 Visit                     Marye Round, PT DPT Acute Rehabilitation Services Secure Chat Preferred  Office [phone]    Juhi Lagrange E Christain [place] 11/10/2022, 3:20 PM

## 2022-11-10 NOTE — Plan of Care (Signed)
  Problem: Activity: Goal: Ability to tolerate increased activity will improve Outcome: Progressing   Problem: Cardiac: Goal: Ability to achieve and maintain adequate cardiovascular perfusion will improve Outcome: Progressing   

## 2022-11-10 NOTE — Evaluation (Addendum)
Occupational Therapy Evaluation Patient Details Name: Martin [name] MRN: [id number] DOB: [birth year] Today's Date: 11/10/2022   History of Present Illness 77 yo male presents to [facility] on 8/25 with headache, weakness, gait instability. Pt found to have complete heart block, s/p pacemaker placement 8/26. PMH includes DM, HTN, HLD.   Clinical Impression   Pt s/p above diagnosis. Pt states no pain at rest, A/Ox4, but some inconsistency with answers, context, and timeframe for PLOF. Pt at baseline lives alone, independent using cane, states he uses uber to ride to restaurants for meals or microwaves meals. Pt states he does not clean his house and it is very messy, no family/friends to assist. Pt has new pacemaker with LUE shoulder ROM restrictions, able to follow restrictions throughout session. Pt demonstrates ability to complete dressing with set up, CGA-min A for balance with STS, 1X LOB initially. Pt would benefit from increased support at home due to decreased safety awareness and fall risk, Rollator recommended to improve safety in home with transporting items. HHOT follow up for safety with ADLs/IADLs around home, to find further assistance for cleaning services. Pt to be seen acutely during stay to maximize safety/functional mobility.      If plan is discharge home, recommend the following: A little help with walking and/or transfers;A little help with bathing/dressing/bathroom;Assistance with cooking/housework;Assist for transportation;Help with stairs or ramp for entrance    Functional Status Assessment  Patient has had a recent decline in their functional status and demonstrates the ability to make significant improvements in function in a reasonable and predictable amount of time.  Equipment Recommendations  Other (comment) (rollator)    Recommendations for Other Services       Precautions / Restrictions Precautions Precautions: Fall;ICD/Pacemaker Precaution Comments: administered  pacemaker precautions handout and reviewed precautions for LUE      Mobility Bed Mobility Overal bed mobility: Needs Assistance Bed Mobility: Supine to Sit, Sit to Supine     Supine to sit: Supervision Sit to supine: Supervision   General bed mobility comments: increased time, verbal cueing for initiation/safety    Transfers Overall transfer level: Needs assistance Equipment used: Rolling walker (2 wheels) Transfers: Sit to/from Stand, Bed to chair/wheelchair/BSC Sit to Stand: Contact guard assist, Min assist     Step pivot transfers: Contact guard assist, Min assist     General transfer comment: needed min A for LOB with transfer once, able to transfer CGA following attempts.      Balance Overall balance assessment: Needs assistance Sitting-balance support: No upper extremity supported, Feet supported Sitting balance-Leahy Scale: Good Sitting balance - Comments: EOB ADLs   Standing balance support: During functional activity, Single extremity supported Standing balance-Leahy Scale: Fair Standing balance comment: standing ADLs, LOBx1 during first STS, following attempts were good, no LOB.                           ADL either performed or assessed with clinical judgement   ADL Overall ADL's : Needs assistance/impaired Eating/Feeding: Independent   Grooming: Contact guard assist;Standing   Upper Body Bathing: Contact guard assist;Sitting   Lower Body Bathing: Contact guard assist;Sitting/lateral leans   Upper Body Dressing : Set up;Sitting   Lower Body Dressing: Contact guard assist;Sit to/from stand   Toilet Transfer: Contact guard assist;Minimal assistance   Toileting- Clothing Manipulation and Hygiene: Contact guard assist;Sit to/from stand       Functional mobility during ADLs: Contact guard assist;Rolling walker (2 wheels) General ADL Comments:  Pt overall good BUE strength, limited with LUE ROM due to new pacemaker. Pt able to don/doff  clothes with set up/CGA for standing ADLs. Did need min A for balance for  LOB with STS once during session     Vision Baseline Vision/History: 1 Wears glasses;4 Cataracts Ability to See in Adequate Light: 0 Adequate Patient Visual Report: No change from baseline       Perception         Praxis         Pertinent Vitals/Pain Pain Assessment Pain Assessment: No/denies pain     Extremity/Trunk Assessment Upper Extremity Assessment Upper Extremity Assessment: Defer to OT evaluation LUE Deficits / Details: new pacemaker, shoulder ROM limitations. 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 LUE Sensation: WNL LUE Coordination: decreased gross motor   Lower Extremity Assessment Lower Extremity Assessment: Generalized weakness       Communication Communication Communication: No apparent difficulties   Cognition Arousal: Alert Behavior During Therapy: WFL for tasks assessed/performed, Impulsive Overall Cognitive Status: No family/caregiver present to determine baseline cognitive functioning Area of Impairment: Attention, Following commands, Safety/judgement, Problem solving                   Current Attention Level: Sustained   Following Commands: Follows one step commands with increased time Safety/Judgement: Decreased awareness of safety, Decreased awareness of deficits   Problem Solving: Slow processing, Difficulty sequencing, Requires verbal cues General Comments: Pt confused with answering questions with timeframe and context. Pt gaves different responses to PT/OT, and inconsistent with answers. Verbose     General Comments       Exercises     Shoulder Instructions      Home Living Family/patient expects to be discharged to:: Private residence Living Arrangements: Alone Available Help at Discharge: Friend(s);Family;Available PRN/intermittently Type of Home: House Home Access: Stairs to enter [employer] of  Steps: 2 Entrance Stairs-Rails: Left Home Layout: One level     Bathroom Shower/Tub: Chief Strategy Officer: Standard     Home Equipment: Agricultural consultant (2 wheels);Shower seat;Cane - single point          Prior Functioning/Environment Prior Level of Function : Independent/Modified Independent;History of Falls (last six months)             Mobility Comments: Pt states uses cane and uber for community. ADLs Comments: Pt states he handles his own finances, needs assistance for cleaning, doesn't clean at home at all.        OT Problem List: Decreased range of motion;Decreased activity tolerance;Impaired balance (sitting and/or standing);Decreased cognition;Decreased safety awareness;Impaired UE functional use;Pain      OT Treatment/Interventions: Self-care/ADL training;Therapeutic exercise;Energy conservation;DME and/or AE instruction;Therapeutic activities;Patient/family education    OT Goals(Current goals can be found in the care plan section) Acute Rehab OT Goals Patient Stated Goal: to return home OT Goal Formulation: With patient Time For Goal Achievement: 11/24/22 Potential to Achieve Goals: Good ADL Goals Pt Will Perform Lower Body Dressing: with modified independence;sit to/from stand Pt Will Transfer to Toilet: with modified independence;ambulating;regular height toilet Pt Will Perform Toileting - Clothing Manipulation and hygiene: with modified independence;sit to/from stand Additional ADL Goal #1: Pt will demonstrate proper safety awareness with transfer/ambulation to toilet using RW without need for verbal cueing, using proper hand placement for STS to improve balance/stability.  OT Frequency: Min 1X/week    Co-evaluation              AM-PAC OT "6  Clicks" Daily Activity     Outcome Measure Help from another person eating meals?: None Help from another person taking care of personal grooming?: A Little Help from another person toileting,  which includes using toliet, bedpan, or urinal?: A Little Help from another person bathing (including washing, rinsing, drying)?: A Little Help from another person to put on and taking off regular upper body clothing?: A Little Help from another person to put on and taking off regular lower body clothing?: A Little 6 Click Score: 19   End of Session Equipment Utilized During Treatment: Gait belt;Rolling walker (2 wheels) Nurse Communication: Mobility status  Activity Tolerance: Patient tolerated treatment well Patient left: in bed;with call bell/phone within reach;with bed alarm set  OT Visit Diagnosis: Unsteadiness on feet (R26.81);Other abnormalities of gait and mobility (R26.89);Repeated falls (R29.6);Muscle weakness (generalized) (M62.81);Other symptoms and signs involving cognitive function;Pain Pain - Right/Left: Left Pain - part of body: Shoulder                Time: 4098-1191 OT Time Calculation (min): 46 min Charges:  OT General Charges $OT Visit: 1 Visit OT Evaluation $OT Eval Low Complexity: 1 Low OT Treatments $Self Care/Home Management : 23-37 mins  [address] OTR/L   Alexis Goodell 11/10/2022, 4:17 PM

## 2022-11-10 NOTE — Progress Notes (Signed)
  Patient Name: Martin [name] Date of Encounter: 11/10/2022  Primary Cardiologist: None Electrophysiologist: Dr. Lalla Brothers  Interval Summary   Feeling ok, mildly sore. Worried about being able to remember the restrictions.   Vital Signs    Vitals:   11/09/22 2355 11/10/22 0500 11/10/22 0745 11/10/22 1045  BP: (!) 158/78 (!) 168/90  (!) 156/68  Pulse: 74 73  68  Resp:  20 18 19   Temp: 98.2 F (36.8 C) 98.3 F (36.8 C) 97.7 F (36.5 C) 98.6 F (37 C)  TempSrc: Oral Oral Oral Oral  SpO2: 98% 97% 97% 96%  Weight:  76.4 kg    Height:        Intake/Output Summary (Last 24 hours) at 11/10/2022 1459 Last data filed at 11/10/2022 1452 Gross per 24 hour  Intake 1458.54 ml  Output 1375 ml  Net 83.54 ml   Filed Weights   11/09/22 0330 11/09/22 0500 11/10/22 0500  Weight: 80.2 kg 80.4 kg 76.4 kg    Physical Exam    GEN- The patient is elderly appearing, alert and oriented x 3 today.   Lungs- Clear to ausculation bilaterally, normal work of breathing Cardiac- Regular rate and rhythm, no murmurs, rubs or gallops GI- soft, NT, ND, + BS Extremities- no clubbing or cyanosis. No edema  Telemetry    NSR / V pacing 70s (personally reviewed)  Hospital Course    [name] is a 77 y.o. male admitted for Mr. Trimper is a 77 year old man with a history of diabetes, hypertension, hyperlipidemia who is being admitted today for general malaise. He was found to be in complete heart block with a ventricular escape in the 40s in the emergency department. He has a history of syncope and was recently admitted in March with a syncopal episode. He was at the bank when he suddenly became lightheaded and had to be lowered to the ground by another Counsellor.   Assessment & Plan    CHB Conduction system disease S/p Abbott PPM 11/09/2022 by Dr. Lalla Brothers Site and CXR stable this am.  Wound care and arm restrictions reviewed, though patient has needed several reminders.   Deconditioning ?  Cognitive impairment Will have PT and OT see and inquire if he has any family that can stay with him, or if he can stay temporarily with his Aunt ; unless higher level of care is recommended.   For questions or updates, please contact [facility] Please consult [email or web address] for contact info under Cardiology/STEMI.  Signed, Graciella Freer, PA-C  11/10/2022, 2:59 PM

## 2022-11-11 LAB — GLUCOSE, CAPILLARY
Glucose-Capillary: 124 mg/dL — ABNORMAL HIGH (ref 70–99)
Glucose-Capillary: 131 mg/dL — ABNORMAL HIGH (ref 70–99)
Glucose-Capillary: 139 mg/dL — ABNORMAL HIGH (ref 70–99)
Glucose-Capillary: 171 mg/dL — ABNORMAL HIGH (ref 70–99)

## 2022-11-11 MED ORDER — CARVEDILOL 3.125 MG PO TABS: 3.1250 mg | ORAL_TABLET | Freq: Two times a day (BID) | ORAL | Status: DC

## 2022-11-11 MED ORDER — LOSARTAN POTASSIUM 25 MG PO TABS
25.0000 mg | ORAL_TABLET | Freq: Every day | ORAL | Status: DC
  Administered 2022-11-12 – 2022-11-13 (×2): 25 mg via ORAL
  Filled 2022-11-11 (×2): qty 1

## 2022-11-11 NOTE — TOC Progression Note (Signed)
Transition of Care [facility]) - Progression Note    Patient Details  Name: Martin [name] MRN: [id number] Date of Birth: 02/06/1946  [facility]) CM/SW Contact  Leander Rams, LCSW Phone Number: 11/11/2022, 1:09 PM  Clinical Narrative:    CSW was informed PT recs for pt has now changed to SNF. Pt is agreeable to dc to SNF. CSW completed fl2 and faxed out.   TOC will continue to follow.   Expected Discharge Plan: Home/Self Care Barriers to Discharge: Continued Medical Work up  Expected Discharge Plan and Services In-house Referral: NA Discharge Planning Services: CM Consult Post Acute Care Choice: NA Living arrangements for the past 2 months: Single Family Home Expected Discharge Date: 11/10/22               DME Arranged: N/A DME Agency: NA       HH Arranged: NA           Social Determinants of Health (SDOH) Interventions SDOH Screenings   Food Insecurity: No Food Insecurity (11/08/2022)  Housing: Low Risk  (11/08/2022)  Transportation Needs: No Transportation Needs (11/08/2022)  Utilities: Not At Risk (11/08/2022)  Financial Resource Strain: Low Risk  (08/24/2022)   Received from [facility]  Physical Activity: Insufficiently Active (08/24/2022)   Received from [facility]  Social Connections: Somewhat Isolated (08/24/2022)   Received from [facility]  Stress: Stress Concern Present (08/24/2022)   Received from [facility]  Tobacco Use: Low Risk  (11/08/2022)    Readmission Risk Interventions     No data to display         Oletta Lamas, MSW, LCSWA, LCASA Transitions of Care  Clinical Social Worker I

## 2022-11-11 NOTE — Plan of Care (Signed)
  Problem: Education: Goal: Understanding of cardiac disease, CV risk reduction, and recovery process will improve Outcome: Progressing Goal: Individualized Educational Video(s) Outcome: Progressing   Problem: Activity: Goal: Ability to tolerate increased activity will improve Outcome: Progressing   Problem: Cardiac: Goal: Ability to achieve and maintain adequate cardiovascular perfusion will improve Outcome: Progressing   

## 2022-11-11 NOTE — Plan of Care (Signed)

## 2022-11-11 NOTE — TOC Progression Note (Signed)
Transition of Care [facility]) - Progression Note    Patient Details  Name: Martin [name] MRN: [id number] Date of Birth: Apr 24, 1945  [facility]) CM/SW Contact  Leone Haven, RN Phone Number: 11/11/2022, 10:17 AM  Clinical Narrative:    Physical therapy said they will be working with him again today, patient currently with therapy.   Expected Discharge Plan: Home/Self Care Barriers to Discharge: Continued Medical Work up  Expected Discharge Plan and Services In-house Referral: NA Discharge Planning Services: CM Consult Post Acute Care Choice: NA Living arrangements for the past 2 months: Single Family Home Expected Discharge Date: 11/10/22               DME Arranged: N/A DME Agency: NA       HH Arranged: NA           Social Determinants of Health (SDOH) Interventions SDOH Screenings   Food Insecurity: No Food Insecurity (11/08/2022)  Housing: Low Risk  (11/08/2022)  Transportation Needs: No Transportation Needs (11/08/2022)  Utilities: Not At Risk (11/08/2022)  Financial Resource Strain: Low Risk  (08/24/2022)   Received from [facility]  Physical Activity: Insufficiently Active (08/24/2022)   Received from [facility]  Social Connections: Somewhat Isolated (08/24/2022)   Received from [facility]  Stress: Stress Concern Present (08/24/2022)   Received from [facility]  Tobacco Use: Low Risk  (11/08/2022)    Readmission Risk Interventions     No data to display

## 2022-11-11 NOTE — Progress Notes (Signed)
Mobility Specialist Progress Note:    11/11/22 1040  Mobility  Activity Ambulated with assistance in hallway  Level of Assistance Minimal assist, patient does 75% or more  Assistive Device Front wheel walker  Distance Ambulated (ft) 400 ft  Activity Response Tolerated well  Mobility Referral Yes  $Mobility charge 1 Mobility  Mobility Specialist Start Time (ACUTE ONLY) [id number]  Mobility Specialist Stop Time (ACUTE ONLY) 0934  Mobility Specialist Time Calculation (min) (ACUTE ONLY) 12 min   Received pt in bed having no complaints and agreeable to mobility. Needed MinA w/ bed mobility and STS, contact guard during ambulation. Pt was asymptomatic throughout ambulation and returned to room w/o fault. Left in bed w/ call bell in reach and all needs met.   Thompson Grayer Mobility Specialist  Please contact vis Secure Chat or  Rehab Office [phone]

## 2022-11-11 NOTE — Plan of Care (Signed)
  Problem: Health Behavior/Discharge Planning: Goal: Ability to manage health-related needs will improve Outcome: Progressing   

## 2022-11-11 NOTE — Progress Notes (Addendum)
Occupational Therapy Treatment Patient Details Name: DAWTON HAGGSTROM MRN: [id number] DOB: 09-01-1945 Today's Date: 11/11/2022   History of present illness 77 yo male presents to [facility] on 8/25 with headache, weakness, gait instability. Pt found to have complete heart block, s/p pacemaker placement 8/26. PMH includes DM, HTN, HLD.   OT comments  Pt in good spirits, eager to participate and increase strength/safety with ADLs/mobility. Pt instructed on safe use with rollator to improve safety with transporting objects to complete ADLs/IADLs. Pt states he has enough room in home to use rollator and believes it will help him with safety. Pt displays overall good balance/safety awareness, able to stand/ambulate as needed, good endurance for activities. Pt able to maintain precautions NWB LUE with activities. Pt does have mild difficulty with STS, instructed on using both hands to push from bed/seat, CGA for STS. Per PT, Pt's BP is unstable after attempting stairs, flucuated from low to normal and back to low after ceasing activities, Pt disoriented for 30 seconds, not able to walk back to room. Pt did state he skipped breakfast today and hasn't eaten. Pt agreeable to postacute therapy <3hrs/day to maximize safety/endurance and independence prior to returning home, will follow acutely.       If plan is discharge home, recommend the following:  A little help with walking and/or transfers;A little help with bathing/dressing/bathroom;Assistance with cooking/housework;Assist for transportation;Help with stairs or ramp for entrance   Equipment Recommendations  Other (comment) (rollator)    Recommendations for Other Services      Precautions / Restrictions Precautions Precautions: Fall;ICD/Pacemaker Precaution Comments: administered pacemaker precautions handout and reviewed precautions for LUE Restrictions Weight Bearing Restrictions: Yes LUE Weight Bearing: Non weight bearing       Mobility Bed  Mobility Overal bed mobility: Modified Independent                  Transfers Overall transfer level: Needs assistance Equipment used: Rollator (4 wheels) Transfers: Sit to/from Stand, Bed to chair/wheelchair/BSC Sit to Stand: Contact guard assist     Step pivot transfers: Supervision     General transfer comment: CGA for STS, supervision for ambulation.     Balance Overall balance assessment: Needs assistance Sitting-balance support: Feet supported, No upper extremity supported Sitting balance-Leahy Scale: Normal Sitting balance - Comments: able to complete ADLs EOB as needed   Standing balance support: During functional activity Standing balance-Leahy Scale: Fair Standing balance comment: able to complete STS with CGA, increased effort using B hands for pushing up. Once standing Pt displays overall good balance.                           ADL either performed or assessed with clinical judgement   ADL Overall ADL's : Needs assistance/impaired Eating/Feeding: Independent   Grooming: Supervision/safety;Standing   Upper Body Bathing: Supervision/ safety;Sitting   Lower Body Bathing: Contact guard assist;Sit to/from stand   Upper Body Dressing : Supervision/safety;Sitting   Lower Body Dressing: Contact guard assist;Sit to/from stand   Toilet Transfer: Supervision/safety;Ambulation;Rollator (4 wheels)   Toileting- Clothing Manipulation and Hygiene: Contact guard assist;Sit to/from stand       Functional mobility during ADLs: Supervision/safety;Rollator (4 wheels) General ADL Comments: Pt displays good overall ability to complete transfers/STS with supervision, able to transport items with RW and rollator for ADL preparation.    Extremity/Trunk Assessment Upper Extremity Assessment Upper Extremity Assessment: LUE deficits/detail LUE Deficits / Details: new pacemaker, shoulder ROM limitations. Pt L 4th/5th  digit PIP/DIP contracture/ulnar clawing from  injury 20 years. LUE Sensation: WNL LUE Coordination: decreased gross motor            Vision       Perception     Praxis      Cognition Arousal: Alert Behavior During Therapy: WFL for tasks assessed/performed Overall Cognitive Status: No family/caregiver present to determine baseline cognitive functioning Area of Impairment: Problem solving                             Problem Solving: Requires verbal cues, Slow processing General Comments: Pt A/Ox4, able to recall events, LUE NWB precautions, some difficulty with carryover of learned techniques for transfers for hand placement with STS        Exercises      Shoulder Instructions       General Comments      Pertinent Vitals/ Pain       Pain Assessment Pain Assessment: No/denies pain  Home Living                                          Prior Functioning/Environment              Frequency  Min 1X/week        Progress Toward Goals  OT Goals(current goals can now be found in the care plan section)  Progress towards OT goals: Progressing toward goals  Acute Rehab OT Goals Patient Stated Goal: Pt eager to return home OT Goal Formulation: With patient Time For Goal Achievement: 11/24/22 Potential to Achieve Goals: Good ADL Goals Pt Will Perform Lower Body Dressing: with modified independence;sit to/from stand Pt Will Transfer to Toilet: with modified independence;ambulating;regular height toilet Pt Will Perform Toileting - Clothing Manipulation and hygiene: with modified independence;sit to/from stand Additional ADL Goal #1: Pt will demonstrate proper safety awareness with transfer/ambulation to toilet using RW without need for verbal cueing, using proper hand placement for STS to improve balance/stability.  Plan      Co-evaluation                 AM-PAC OT "6 Clicks" Daily Activity     Outcome Measure   Help from another person eating meals?: None Help  from another person taking care of personal grooming?: A Little Help from another person toileting, which includes using toliet, bedpan, or urinal?: A Little Help from another person bathing (including washing, rinsing, drying)?: A Little Help from another person to put on and taking off regular upper body clothing?: A Little Help from another person to put on and taking off regular lower body clothing?: A Little 6 Click Score: 19    End of Session Equipment Utilized During Treatment: Gait belt;Rollator (4 wheels)  OT Visit Diagnosis: Unsteadiness on feet (R26.81);Other abnormalities of gait and mobility (R26.89);Repeated falls (R29.6);Muscle weakness (generalized) (M62.81);Other symptoms and signs involving cognitive function;Pain Pain - Right/Left: Left Pain - part of body: Shoulder   Activity Tolerance Patient tolerated treatment well   Patient Left Other (comment) (left with PT)   Nurse Communication Mobility status        Time: 1610-9604 OT Time Calculation (min): 30 min  Charges: OT General Charges $OT Visit: 1 Visit OT Treatments $Self Care/Home Management : 8-22 mins $Therapeutic Activity: 8-22 mins  Iriana Artley, OTR/L   Hideko Esselman R Yaniah Thiemann 11/11/2022,  10:55 AM

## 2022-11-11 NOTE — Progress Notes (Signed)
Physical Therapy Treatment Patient Details Name: Martin [name] MRN: [id number] DOB: [birth year] Today's Date: 11/11/2022   History of Present Illness 77 yo male presents to [facility] on 8/25 with headache, weakness, gait instability. Pt found to have complete heart block, s/p pacemaker placement 8/26. PMH includes DM, HTN, HLD.    PT Comments  Pt admitted with above diagnosis. Pt was able to ambulate with rollator in hallway.  Went to practice steps and pt had disoriented episode with BP to 87/54.  Rolled pt to room.  Once pt felt better, stood and BP in standing 121/88.  Pt moved to bed and BP 92/50.  Nurse and MD made aware.  Pt was surprised at his weakness after incr effort with activity and agrees to post acute Rehab < 3 hours day. Updated plan accordingly. Pt currently with functional limitations due to the deficits listed below (see PT Problem List). Pt will benefit from acute skilled PT to increase their independence and safety with mobility to allow discharge.       If plan is discharge home, recommend the following: A little help with walking and/or transfers;A little help with bathing/dressing/bathroom;Assist for transportation;Direct supervision/assist for medications management;Help with stairs or ramp for entrance;Supervision due to cognitive status   Can travel by private vehicle        Equipment Recommendations  None recommended by PT    Recommendations for Other Services       Precautions / Restrictions Precautions Precautions: Fall;ICD/Pacemaker Precaution Comments: pacemaker precautions handout and reviewed precautions for LUE Restrictions Weight Bearing Restrictions: Yes LUE Weight Bearing: Non weight bearing     Mobility  Bed Mobility Overal bed mobility: Modified Independent Bed Mobility: Sit to Supine       Sit to supine: Supervision, HOB elevated   General bed mobility comments: cues for sequencing and safe mechanics    Transfers Overall transfer level:  Needs assistance Equipment used: Rollator (4 wheels) Transfers: Sit to/from Stand, Bed to chair/wheelchair/BSC Sit to Stand: Min assist           General transfer comment: Pt needed min assist to stand after having episode of hypotension.  Pt requiring min assist for power up and for safety.    Ambulation/Gait Ambulation/Gait assistance: Min assist, +2 safety/equipment Gait Distance (Feet): 100 Feet (100 feet then 5 ffeet) Assistive device: Rollator (4 wheels) Gait Pattern/deviations: Step-through pattern, Decreased stride length, Trunk flexed, Drifts right/left Gait velocity: decr     General Gait Details: Met OT and pt in hallway and pt continued his walk with PT to stairwell.  Pt was close guard to min assist for safety, cues for resting LUE on rollator and avoid pushing/pulling, needs rollator for balance.  Took a few steps to bed upon return to room. .   Stairs Stairs: Yes Stairs assistance: Min assist, +2 physical assistance Stair Management: One rail Right, Forwards, Step to pattern Number of Stairs: 3 General stair comments: Pt only able to complete 3 steps wtih right rail with +2 min assist for safety. Pt struggled to place feet correctly and going up seemed to wear him out and he had difficulty coming back down. Once pt to bottom stair, pt c/o dizziness.  Had pt sit on rollator as he stated he really felt bad.  Rolled pt on rollator back to room and BP was 87/54 with HR 84 bpm.  Pt describes sensation as disorientation.   Wheelchair Mobility     Tilt Bed    Modified Rankin (Stroke Patients  Only)       Balance Overall balance assessment: Needs assistance Sitting-balance support: Feet supported, No upper extremity supported Sitting balance-Leahy Scale: Normal     Standing balance support: During functional activity Standing balance-Leahy Scale: Poor Standing balance comment: able to complete STS with min assist, increased effort using B hands for pushing up.  Once standing Pt displays overall good balance.                            Cognition Arousal: Alert Behavior During Therapy: WFL for tasks assessed/performed Overall Cognitive Status: No family/caregiver present to determine baseline cognitive functioning Area of Impairment: Problem solving                   Current Attention Level: Sustained   Following Commands: Follows one step commands with increased time Safety/Judgement: Decreased awareness of deficits, Decreased awareness of safety   Problem Solving: Requires verbal cues, Slow processing General Comments: Pt A/Ox4, able to recall events, LUE NWB precautions, some difficulty with carryover of learned techniques for transfers for hand placement with STS        Exercises      General Comments        Pertinent Vitals/Pain Pain Assessment Pain Assessment: No/denies pain    Home Living                          Prior Function            PT Goals (current goals can now be found in the care plan section) Progress towards PT goals: Progressing toward goals    Frequency    Min 1X/week      PT Plan      Co-evaluation              AM-PAC PT "6 Clicks" Mobility   Outcome Measure  Help needed turning from your back to your side while in a flat bed without using bedrails?: A Little Help needed moving from lying on your back to sitting on the side of a flat bed without using bedrails?: A Little Help needed moving to and from a bed to a chair (including a wheelchair)?: A Lot Help needed standing up from a chair using your arms (e.g., wheelchair or bedside chair)?: A Little Help needed to walk in hospital room?: A Little Help needed climbing 3-5 steps with a railing? : Total 6 Click Score: 15    End of Session Equipment Utilized During Treatment: Gait belt Activity Tolerance: Patient limited by fatigue Patient left: in bed;with call bell/phone within reach;with bed alarm  set Nurse Communication: Mobility status PT Visit Diagnosis: Other abnormalities of gait and mobility (R26.89);Muscle weakness (generalized) (M62.81)     Time: 8119-1478 PT Time Calculation (min) (ACUTE ONLY): 27 min  Charges:    $Gait Training: 23-37 mins PT General Charges $$ ACUTE PT VISIT: 1 Visit                     Ocean Kearley M,PT Acute Rehab Services [phone]    Bevelyn Buckles 11/11/2022, 1:48 PM

## 2022-11-11 NOTE — Care Management Important Message (Signed)
Important Message  Patient Details  Name: Martin [name] MRN: [id number] Date of Birth: Sep 13, 1945   Medicare Important Message Given:  Yes     Renie Ora 11/11/2022, 11:06 AM

## 2022-11-11 NOTE — NC FL2 (Addendum)
[place] MEDICAID FL2 LEVEL OF CARE FORM     IDENTIFICATION  Patient Name: Martin [name] Birthdate: 03/02/1946 Sex: male Admission Date (Current Location): 11/08/2022  [facility] and [place] Number:      Facility and Address:  The . [facility], [address], [place], [place] [address]      Provider Number: [id number]  Attending Physician Name and Address:  Lanier Prude, MD  Relative Name and Phone Number:       Current Level of Care: Hospital Recommended Level of Care: Skilled Nursing Facility Prior Approval Number:    Date Approved/Denied:   PASRR Number: [id number]  Discharge Plan: SNF    Current Diagnoses: Patient Active Problem List   Diagnosis Date Noted   CHB (complete heart block) (HCC) 11/08/2022   Bilateral impacted cerumen 08/21/2016   Conductive hearing loss, bilateral 08/21/2016   Gastroesophageal reflux disease without esophagitis 12/24/2015   Need for hepatitis C screening test 09/23/2015   Diabetes mellitus (HCC) 04/28/2013   Acute kidney injury (HCC) 07/08/2012   Caregiver stress 04/24/2012   Metabolic syndrome 12/24/2011   Dyspnea 12/24/2011   Myofascial pain dysfunction syndrome 07/24/2011   DDD (degenerative disc disease), cervical 06/24/2011   Anxiety 11/21/2010   Depression 11/21/2010   TRANSAMINASES, SERUM, ELEVATED 04/03/2010   Allergic rhinitis due to other allergen 07/18/2008   HYPERLIPIDEMIA 01/11/2008   HYPERTENSION, BENIGN ESSENTIAL 01/11/2008   HYPERTROPHY PROSTATE W/UR OBST & OTH LUTS 01/11/2008    Orientation RESPIRATION BLADDER Height & Weight     Self, Time, Situation, Place  Normal Continent Weight: 164 lb 0.4 oz (74.4 kg) Height:  5\' 8"  (172.7 cm)  BEHAVIORAL SYMPTOMS/MOOD NEUROLOGICAL BOWEL NUTRITION STATUS      Continent Diet (See dc summary)  AMBULATORY STATUS COMMUNICATION OF NEEDS Skin   Limited Assist Verbally Normal                       Personal Care Assistance Level of  Assistance  Bathing, Feeding, Dressing Bathing Assistance: Limited assistance Feeding assistance: Independent Dressing Assistance: Limited assistance     Functional Limitations Info  Sight, Hearing, Speech Sight Info: Adequate Hearing Info: Adequate Speech Info: Adequate    SPECIAL CARE FACTORS FREQUENCY  PT (By licensed PT), OT (By licensed OT)     PT Frequency: 5xweek OT Frequency: 5xweek            Contractures Contractures Info: Not present    Additional Factors Info  Code Status, Allergies Code Status Info: Full Allergies Info: Enalapril Maleate           Current Medications (11/11/2022):  This is the current hospital active medication list Current Facility-Administered Medications  Medication Dose Route Frequency Provider Last Rate Last Admin   0.9 %  sodium chloride infusion  250 mL Intravenous PRN Barrett, Rhonda G, PA-C 10 mL/hr at 11/09/22 2255 250 mL at 11/09/22 2255   0.9 %  sodium chloride infusion   Intravenous Continuous Graciella Freer, PA-C 50 mL/hr at 11/09/22 2313 Restarted at 11/09/22 2313   acetaminophen (TYLENOL) tablet 650 mg  650 mg Oral Q4H PRN Barrett, Joline Salt, PA-C   650 mg at 11/10/22 0901   ALPRAZolam (XANAX) tablet 0.25 mg  0.25 mg Oral BID PRN Barrett, Joline Salt, PA-C   0.25 mg at 11/10/22 2038   atorvastatin (LIPITOR) tablet 40 mg  40 mg Oral Daily Barrett, Rhonda G, PA-C   40 mg at 11/11/22 0948   Chlorhexidine  Gluconate Cloth 2 % PADS 6 each  6 each Topical Q0600 Lanier Prude, MD   6 each at 11/11/22 0948   hydrochlorothiazide (HYDRODIURIL) tablet 25 mg  25 mg Oral Daily Jacalyn Lefevre, MD   25 mg at 11/11/22 0948   insulin aspart (novoLOG) injection 0-15 Units  0-15 Units Subcutaneous TID WC Barrett, Joline Salt, PA-C   2 Units at 11/11/22 1255   insulin aspart (novoLOG) injection 0-5 Units  0-5 Units Subcutaneous QHS Barrett, Rhonda G, PA-C       latanoprost (XALATAN) 0.005 % ophthalmic solution 1 drop  1 drop Both Eyes QHS  Barrett, Rhonda G, PA-C   1 drop at 11/10/22 2242   [START ON 11/12/2022] losartan (COZAAR) tablet 25 mg  25 mg Oral Daily Graciella Freer, PA-C       mupirocin ointment (BACTROBAN) 2 % 1 Application  1 Application Nasal BID Lanier Prude, MD   1 Application at 11/11/22 0948   nitroGLYCERIN (NITROSTAT) SL tablet 0.4 mg  0.4 mg Sublingual Q5 Min x 3 PRN Barrett, Rhonda G, PA-C       ondansetron (ZOFRAN) injection 4 mg  4 mg Intravenous Q6H PRN Barrett, Rhonda G, PA-C       sertraline (ZOLOFT) tablet 100 mg  100 mg Oral Daily Barrett, Rhonda G, PA-C   100 mg at 11/11/22 0948   sodium chloride flush (NS) 0.9 % injection 3 mL  3 mL Intravenous Q12H Barrett, Rhonda G, PA-C   3 mL at 11/10/22 2243   sodium chloride flush (NS) 0.9 % injection 3 mL  3 mL Intravenous PRN Barrett, Joline Salt, PA-C       tamsulosin (FLOMAX) capsule 0.4 mg  0.4 mg Oral QPC supper Barrett, Rhonda G, PA-C   0.4 mg at 11/10/22 1810   zolpidem (AMBIEN) tablet 5 mg  5 mg Oral QHS PRN Barrett, Joline Salt, PA-C   5 mg at 11/09/22 2250     Discharge Medications: Please see discharge summary for a list of discharge medications.  Relevant Imaging Results:  Relevant Lab Results:   Additional Information SSN: [id number]  Oletta Lamas, MSW, [facility] Transitions of Care  Clinical Social Worker I

## 2022-11-11 NOTE — Progress Notes (Signed)
  Patient Name: COLLEEN LAMUNYON Date of Encounter: 11/11/2022  Primary Cardiologist: None Electrophysiologist: None  Interval Summary   Patient is feeling OK this am at rest. Was expecting to "Feel better faster" after pacer.   No-one to stay at home with him. PT/OT recommending SNF.   Vital Signs    Vitals:   11/10/22 2019 11/11/22 0030 11/11/22 0431 11/11/22 0756  BP: (!) 164/69  126/62 (!) 150/65  Pulse: 70   63  Resp: 18 20 18 18   Temp: 98.2 F (36.8 C) 98 F (36.7 C) 98.1 F (36.7 C) 98.1 F (36.7 C)  TempSrc: Oral Oral Oral Oral  SpO2: 98%   91%  Weight:   74.4 kg   Height:        Intake/Output Summary (Last 24 hours) at 11/11/2022 1054 Last data filed at 11/11/2022 0433 Gross per 24 hour  Intake 720 ml  Output 2700 ml  Net -1980 ml   Filed Weights   11/09/22 0500 11/10/22 0500 11/11/22 0431  Weight: 80.4 kg 76.4 kg 74.4 kg    Physical Exam    GEN- The patient is well appearing, alert and oriented x 3 today.   Lungs- Clear to ausculation bilaterally, normal work of breathing Cardiac- Regular rate and rhythm, no murmurs, rubs or gallops GI- soft, NT, ND, + BS Extremities- no clubbing or cyanosis. No edema  Telemetry    NSR / V pacing 70s (personally reviewed)  Hospital Course    BRAYLYNN MEDVEC is a 77 y.o. male admitted for Mr. Brosius is a 77 year old man with a history of diabetes, hypertension, hyperlipidemia who is being admitted today for general malaise. He was found to be in complete heart block with a ventricular escape in the 40s in the emergency department. He has a history of syncope and was recently admitted in March with a syncopal episode. He was at the bank when he suddenly became lightheaded and had to be lowered to the ground by another Counsellor.   Assessment & Plan     Conduction system disease S/p Abbott PPM 11/09/2022 by Dr. Lalla Brothers Site and CXR stable  Wound care and arm restrictions have been reviewed and are in AVS.  Pt  doing better at following restrictions today.    Deconditioning ? Cognitive impairment No one to help at home.  PT and OT recommending SNF in light of that.  Will work with [facility] team to arrange.   To SNF pending insurance/Bed.   For questions or updates, please contact [facility] Please consult [email or web address] for contact info under Cardiology/STEMI.  Signed, Graciella Freer, PA-C  11/11/2022, 10:54 AM

## 2022-11-12 LAB — GLUCOSE, CAPILLARY
Glucose-Capillary: 138 mg/dL — ABNORMAL HIGH (ref 70–99)
Glucose-Capillary: 242 mg/dL — ABNORMAL HIGH (ref 70–99)
Glucose-Capillary: 149 mg/dL — ABNORMAL HIGH (ref 70–99)
Glucose-Capillary: 135 mg/dL — ABNORMAL HIGH (ref 70–99)

## 2022-11-12 NOTE — Plan of Care (Signed)
  Problem: Education: Goal: Understanding of cardiac disease, CV risk reduction, and recovery process will improve Outcome: Progressing Goal: Individualized Educational Video(s) Outcome: Progressing   Problem: Activity: Goal: Ability to tolerate increased activity will improve Outcome: Progressing   

## 2022-11-12 NOTE — Progress Notes (Signed)
  Patient Name: Martin [name] Date of Encounter: 11/12/2022  Primary Cardiologist: None Electrophysiologist: Dr. Lalla Brothers  Interval Summary   More awake and alert this am. Awaiting SNF  Vital Signs    Vitals:   11/11/22 1606 11/11/22 2027 11/12/22 0019 11/12/22 0442  BP: 136/63 (!) 117/53 (!) 149/54 (!) 135/59  Pulse: 67 69 65 67  Resp: 18 18 18 18   Temp: 98 F (36.7 C) 98.2 F (36.8 C) 98.1 F (36.7 C) 98.6 F (37 C)  TempSrc: Oral Oral Oral Oral  SpO2: 99% 97% 98% 95%  Weight:    72.6 kg  Height:        Intake/Output Summary (Last 24 hours) at 11/12/2022 0808 Last data filed at 11/12/2022 0448 Gross per 24 hour  Intake --  Output 900 ml  Net -900 ml   Filed Weights   11/10/22 0500 11/11/22 0431 11/12/22 0442  Weight: 76.4 kg 74.4 kg 72.6 kg    Physical Exam    GEN- The patient is well appearing, alert and oriented x 3 today.   Lungs- Clear to ausculation bilaterally, normal work of breathing Cardiac- Regular rate and rhythm, no murmurs, rubs or gallops GI- soft, NT, ND, + BS Extremities- no clubbing or cyanosis. No edema  Telemetry    NSR 60s, V pacing (personally reviewed)  Hospital Course    Martin [name] is a 77 y.o. male admitted for Martin [name] is a 77 year old man with a history of diabetes, hypertension, hyperlipidemia who is being admitted today for general malaise. He was found to be in complete heart block with a ventricular escape in the 40s in the emergency department. He has a history of syncope and was recently admitted in March with a syncopal episode. He was at the bank when he suddenly became lightheaded and had to be lowered to the ground by another Counsellor.   Assessment & Plan    Conduction system disease S/p Abbott PPM 11/09/2022 by Dr. Lalla Brothers Site and CXR stable  Wound care and arm restrictions have been reviewed and are in AVS.  More alert today.    Deconditioning ? Cognitive impairment No one to help at home.  PT and OT  recommending SNF in light of that.  Ok for discharge pending Bed.   Hypotension BP dropped yesterday, Losartan decreased.  Follow   For questions or updates, please contact [facility] Please consult [email or web address] for contact info under Cardiology/STEMI.  Signed, Graciella Freer, PA-C  11/12/2022, 8:08 AM

## 2022-11-12 NOTE — TOC Progression Note (Signed)
Transition of Care [facility]) - Progression Note    Patient Details  Name: Martin [name] MRN: [id number] Date of Birth: Aug 06, 1945  Transition of Care [facility]) CM/SW Contact  Leander Rams, LCSW Phone Number: 11/12/2022, 2:41 PM  Clinical Narrative:    CSW met with pt at bedside to discuss bed offers.. Pt has accepted offer with [facility]. CSW reached out to [facility] who states they can admit pt tomorrow. CSW has started insurance auth.   TOC will continue to follow.    Expected Discharge Plan: Home/Self Care Barriers to Discharge: Continued Medical Work up  Expected Discharge Plan and Services In-house Referral: NA Discharge Planning Services: CM Consult Post Acute Care Choice: NA Living arrangements for the past 2 months: Single Family Home Expected Discharge Date: 11/10/22               DME Arranged: N/A DME Agency: NA       HH Arranged: NA           Social Determinants of Health (SDOH) Interventions SDOH Screenings   Food Insecurity: No Food Insecurity (11/08/2022)  Housing: Low Risk  (11/08/2022)  Transportation Needs: No Transportation Needs (11/08/2022)  Utilities: Not At Risk (11/08/2022)  Financial Resource Strain: Low Risk  (08/24/2022)   Received from [facility]  Physical Activity: Insufficiently Active (08/24/2022)   Received from [facility]  Social Connections: Somewhat Isolated (08/24/2022)   Received from [facility]  Stress: Stress Concern Present (08/24/2022)   Received from [facility]  Tobacco Use: Low Risk  (11/08/2022)    Readmission Risk Interventions     No data to display         Oletta Lamas, MSW, LCSWA, LCASA Transitions of Care  Clinical Social Worker I

## 2022-11-12 NOTE — Progress Notes (Signed)
Mobility Specialist Progress Note:    11/12/22 1024  Mobility  Activity Ambulated with assistance in hallway  Level of Assistance Minimal assist, patient does 75% or more  Assistive Device Four wheel walker  Distance Ambulated (ft) 400 ft  LUE Weight Bearing NWB  Activity Response Tolerated well  Mobility Referral Yes  $Mobility charge 1 Mobility  Mobility Specialist Start Time (ACUTE ONLY) 0932  Mobility Specialist Stop Time (ACUTE ONLY) 0945  Mobility Specialist Time Calculation (min) (ACUTE ONLY) 13 min   Pt received in bed, agreeable to ambulate. Pt performed bed mobility independently and needed light MinA w/ STS. Pt states he is feeling much better today no lightheadedness nor dizziness. Pt returned to room w/o fault. Call bell and personal belongings in reach. All needs met.  Post Mobility- BP 153/61 HR 74  Thompson Grayer Mobility Specialist  Please contact vis Secure Chat or  Rehab Office [phone]

## 2022-11-13 LAB — GLUCOSE, CAPILLARY
Glucose-Capillary: 120 mg/dL — ABNORMAL HIGH (ref 70–99)
Glucose-Capillary: 104 mg/dL — ABNORMAL HIGH (ref 70–99)

## 2022-11-13 MED ORDER — LOSARTAN POTASSIUM 25 MG PO TABS: 25.0000 mg | ORAL_TABLET | Freq: Every day | ORAL | 6 refills | Status: DC

## 2022-11-13 MED ORDER — METOPROLOL SUCCINATE ER 25 MG PO TB24: 12.5000 mg | ORAL_TABLET | Freq: Every day | ORAL | 3 refills | Status: DC

## 2022-11-13 NOTE — Progress Notes (Signed)
Mobility Specialist Progress Note:    11/13/22 1032  Mobility  Activity Ambulated with assistance in hallway  Level of Assistance Contact guard assist, steadying assist  Assistive Device Four wheel walker  Distance Ambulated (ft) 400 ft  Activity Response Tolerated well  Mobility Referral Yes  $Mobility charge 1 Mobility  Mobility Specialist Start Time (ACUTE ONLY) 1012  Mobility Specialist Stop Time (ACUTE ONLY) 1026  Mobility Specialist Time Calculation (min) (ACUTE ONLY) 14 min   Received pt supine in bed having no complaints and agreeable to mobility. Pt was asymptomatic throughout ambulation and returned to room w/o fault. Left seated EOB w/ call bell in reach and all needs met.   Thompson Grayer Mobility Specialist  Please contact vis Secure Chat or  Rehab Office [phone] '

## 2022-11-13 NOTE — TOC Transition Note (Signed)
Transition of Care [facility]) - CM/SW Discharge Note   Patient Details  Name: Martin [name] MRN: [id number] Date of Birth: 1945-08-17  Transition of Care [facility]) CM/SW Contact:  Leander Rams, LCSW Phone Number: 11/13/2022, 1:11 PM   Clinical Narrative:    Patient will DC to:[facility] Anticipated DC date: 11/13/2022 Family notified: Pt states he will call them Transport by: Sharin Mons   Per MD patient ready for DC to [facility]. RN, patient, patient's family, and facility notified of DC. Discharge Summary and FL2 sent to facility. RN to call report prior to discharge [phone]. DC packet on chart. Ambulance transport requested for patient.   CSW will sign off for now as social work intervention is no longer needed. Please consult [place] again if new needs arise.    Final next level of care: Skilled Nursing Facility Barriers to Discharge: No Barriers Identified   Patient Goals and CMS Choice   Choice offered to / list presented to : NA  Discharge Placement                Patient chooses bed at: [facility] Patient to be transferred to facility by: [facility] Name of family member notified: Pt states he will call them. Patient and family notified of of transfer: 11/13/22  Discharge Plan and Services Additional resources added to the After Visit Summary for   In-house Referral: NA Discharge Planning Services: CM Consult Post Acute Care Choice: NA          DME Arranged: N/A DME Agency: NA       HH Arranged: NA          Social Determinants of Health (SDOH) Interventions SDOH Screenings   Food Insecurity: No Food Insecurity (11/08/2022)  Housing: Low Risk  (11/08/2022)  Transportation Needs: No Transportation Needs (11/08/2022)  Utilities: Not At Risk (11/08/2022)  Financial Resource Strain: Low Risk  (08/24/2022)   Received from [facility]  Physical Activity: Insufficiently Active (08/24/2022)   Received from [facility]  Social Connections: Somewhat Isolated (08/24/2022)    Received from [facility]  Stress: Stress Concern Present (08/24/2022)   Received from [facility]  Tobacco Use: Low Risk  (11/08/2022)     Readmission Risk Interventions     No data to display          Oletta Lamas, MSW, LCSWA, LCASA Transitions of Care  Clinical Social Worker I

## 2022-11-19 ENCOUNTER — Ambulatory Visit: Payer: Medicare HMO | Attending: Cardiovascular Disease

## 2022-11-19 DIAGNOSIS — E1169 Type 2 diabetes mellitus with other specified complication: Secondary | ICD-10-CM | POA: Diagnosis not present

## 2022-11-19 DIAGNOSIS — I10 Essential (primary) hypertension: Secondary | ICD-10-CM | POA: Diagnosis not present

## 2022-11-19 DIAGNOSIS — I442 Atrioventricular block, complete: Secondary | ICD-10-CM

## 2022-11-19 DIAGNOSIS — Z950 Presence of cardiac pacemaker: Secondary | ICD-10-CM | POA: Diagnosis not present

## 2022-11-19 LAB — CUP PACEART INCLINIC DEVICE CHECK
Battery Remaining Longevity: 132 mo
Battery Voltage: 3.07 V
Brady Statistic RA Percent Paced: 36 %
Brady Statistic RV Percent Paced: 99.94 %
Date Time Interrogation Session: 20240905195524
Implantable Lead Connection Status: 753985
Implantable Lead Connection Status: 753985
Implantable Lead Implant Date: 20240826
Implantable Lead Implant Date: 20240826
Implantable Lead Location: 753859
Implantable Lead Location: 753860
Implantable Pulse Generator Implant Date: 20240826
Lead Channel Impedance Value: 537.5 Ohm
Lead Channel Impedance Value: 600 Ohm
Lead Channel Pacing Threshold Amplitude: 0.5 V
Lead Channel Pacing Threshold Amplitude: 0.5 V
Lead Channel Pacing Threshold Amplitude: 0.75 V
Lead Channel Pacing Threshold Amplitude: 0.75 V
Lead Channel Pacing Threshold Pulse Width: 0.5 ms
Lead Channel Pacing Threshold Pulse Width: 0.5 ms
Lead Channel Pacing Threshold Pulse Width: 0.5 ms
Lead Channel Pacing Threshold Pulse Width: 0.5 ms
Lead Channel Sensing Intrinsic Amplitude: 1.8 mV
Lead Channel Sensing Intrinsic Amplitude: 12 mV
Lead Channel Setting Pacing Amplitude: 1 V
Lead Channel Setting Pacing Amplitude: 1.5 V
Lead Channel Setting Pacing Pulse Width: 0.5 ms
Lead Channel Setting Sensing Sensitivity: 2 mV
Pulse Gen Model: 2272
Pulse Gen Serial Number: 8206075

## 2022-11-19 NOTE — Progress Notes (Signed)
Wound check appointment. Steri-strips removed. Wound without redness or edema. Incision edges approximated, wound well healed. Normal device function. Thresholds, sensing, and impedances consistent with implant measurements. Device programmed at 3.5V/auto capture programmed on for extra safety margin until 3 month visit. Histogram distribution appropriate for patient and level of activity. 1 AMS episode appears AT; however, there is noted oversensing of FF R waves. See programming change below. No high ventricular rates noted. Patient educated about wound care, arm mobility, lifting restrictions. ROV in 3 months with implanting physician.  PROGRAMMING CHANGE: Turned on AUTO SENSE in RA

## 2022-11-19 NOTE — Patient Instructions (Signed)
   After Your Pacemaker   Monitor your pacemaker site for redness, swelling, and drainage. Call the device clinic at [phone] if you experience these symptoms or fever/chills.  Your incision was closed with Steri-strips or staples:  You may shower 7 days after your procedure and wash your incision with soap and water. Avoid lotions, ointments, or perfumes over your incision until it is well-healed.  You may use a hot tub or a pool after your wound check appointment if the incision is completely closed.  Do not lift, push or pull greater than 10 pounds with the affected arm until 6 weeks after your procedure. UNTIL AFTER OCTOBER 7TH. There are no other restrictions in arm movement after your wound check appointment.  You may drive, unless driving has been restricted by your healthcare providers.  Remote monitoring is used to monitor your pacemaker from home. This monitoring is scheduled every 91 days by our office. It allows [place] to keep an eye on the functioning of your device to ensure it is working properly. You will routinely see your Electrophysiologist annually (more often if necessary).

## 2022-11-25 DIAGNOSIS — I10 Essential (primary) hypertension: Secondary | ICD-10-CM | POA: Diagnosis not present

## 2022-11-25 DIAGNOSIS — J3089 Other allergic rhinitis: Secondary | ICD-10-CM

## 2022-11-25 DIAGNOSIS — R2689 Other abnormalities of gait and mobility: Secondary | ICD-10-CM

## 2022-11-25 DIAGNOSIS — F32A Depression, unspecified: Secondary | ICD-10-CM

## 2022-11-25 DIAGNOSIS — E785 Hyperlipidemia, unspecified: Secondary | ICD-10-CM | POA: Diagnosis not present

## 2022-11-25 DIAGNOSIS — I442 Atrioventricular block, complete: Secondary | ICD-10-CM | POA: Diagnosis not present

## 2022-11-25 DIAGNOSIS — K219 Gastro-esophageal reflux disease without esophagitis: Secondary | ICD-10-CM

## 2022-11-25 DIAGNOSIS — M6281 Muscle weakness (generalized): Secondary | ICD-10-CM

## 2022-11-25 DIAGNOSIS — F419 Anxiety disorder, unspecified: Secondary | ICD-10-CM

## 2022-11-25 DIAGNOSIS — E119 Type 2 diabetes mellitus without complications: Secondary | ICD-10-CM | POA: Diagnosis not present

## 2022-12-03 ENCOUNTER — Other Ambulatory Visit: Payer: Self-pay

## 2022-12-03 ENCOUNTER — Telehealth: Payer: Self-pay | Admitting: Cardiology

## 2022-12-03 ENCOUNTER — Emergency Department (HOSPITAL_COMMUNITY)
Admission: EM | Admit: 2022-12-03 | Discharge: 2022-12-04 | Disposition: A | Discharge status: Home / Self Care | Payer: Medicare HMO | Attending: Emergency Medicine | Admitting: Emergency Medicine

## 2022-12-03 ENCOUNTER — Encounter (HOSPITAL_COMMUNITY): Payer: Self-pay | Admitting: Emergency Medicine

## 2022-12-03 DIAGNOSIS — I10 Essential (primary) hypertension: Secondary | ICD-10-CM | POA: Diagnosis not present

## 2022-12-03 DIAGNOSIS — Z711 Person with feared health complaint in whom no diagnosis is made: Secondary | ICD-10-CM | POA: Insufficient documentation

## 2022-12-03 DIAGNOSIS — Z950 Presence of cardiac pacemaker: Secondary | ICD-10-CM | POA: Insufficient documentation

## 2022-12-03 DIAGNOSIS — E119 Type 2 diabetes mellitus without complications: Secondary | ICD-10-CM | POA: Diagnosis not present

## 2022-12-03 DIAGNOSIS — Z79899 Other long term (current) drug therapy: Secondary | ICD-10-CM | POA: Diagnosis not present

## 2022-12-03 DIAGNOSIS — Z7984 Long term (current) use of oral hypoglycemic drugs: Secondary | ICD-10-CM | POA: Insufficient documentation

## 2022-12-03 LAB — TROPONIN I (HIGH SENSITIVITY): Troponin I (High Sensitivity): 7 ng/L (ref <18)

## 2022-12-03 LAB — BASIC METABOLIC PANEL
Anion gap: 13 (ref 5–15)
BUN: 21 mg/dL (ref 8–23)
CO2: 22 mmol/L (ref 22–32)
Calcium: 9.3 mg/dL (ref 8.9–10.3)
Chloride: 97 mmol/L — ABNORMAL LOW (ref 98–111)
Creatinine, Ser: 1.08 mg/dL (ref 0.61–1.24)
GFR, Estimated: >60 mL/min (ref >60)
Glucose, Bld: 109 mg/dL — ABNORMAL HIGH (ref 70–99)
Potassium: 3.8 mmol/L (ref 3.5–5.1)
Sodium: 132 mmol/L — ABNORMAL LOW (ref 135–145)

## 2022-12-03 LAB — CBC
HCT: 45.1 % (ref 39.0–52.0)
Hemoglobin: 15.1 g/dL (ref 13.0–17.0)
MCH: 31.2 pg (ref 26.0–34.0)
MCHC: 33.5 g/dL (ref 30.0–36.0)
MCV: 93.2 fL (ref 80.0–100.0)
Platelets: 211 10*3/uL (ref 150–400)
RBC: 4.84 MIL/uL (ref 4.22–5.81)
RDW: 12.8 % (ref 11.5–15.5)
WBC: 10.7 10*3/uL — ABNORMAL HIGH (ref 4.0–10.5)
nRBC: 0 % (ref 0.0–0.2)

## 2022-12-03 NOTE — Telephone Encounter (Signed)
Patient advised he may use his microwave.   Patient reports right after leaving his device clinic appointment (11/19/22) he felt increased fatigue and had to rest approx. 3 hours to gain his strength back to stand up. Programming changes made to pacemaker were, Programmed auto sense on RA lead d/t oversensing FF of R waves. RA paced 36% / RV paced 99%. Patients breathing sounded labored during our conversations.  Unable to send manual transmission d/t technical difficulties. Discussed with patient my concern for unable to receive manual transmission, new device placement, dependent and unable to verify lead measurements, I advised patient he needs to go to ER for evaluation. Patient does not have anyone to take him, advised him to call 911 and go to [facility]. Patient agreeable to plan and states he will call/go at this time.  Routing to triage to make sure someone follows up based on new device placement and symptoms.

## 2022-12-03 NOTE — ED Triage Notes (Signed)
Pt arrives via EMS from home with reports of new pacemaker placement in August. Endorses anxiety about the device. Denies weakness or SOB. EMS reports normal EKG.

## 2022-12-03 NOTE — Telephone Encounter (Signed)
Patient would like to know if it is safe to use microwaves with a PPM. He states he has heard different things his entire life and he has concerns.

## 2022-12-03 NOTE — ED Provider Triage Note (Signed)
Emergency Medicine Provider Triage Evaluation Note  Martin [name] , a 77 y.o. male  was evaluated in triage.  Pt complains of feeling "wiped" and anxious today. Denies any shortness of breath, chest pain, dizziness. Denies any fever or chills, cough.  Review of Systems  Positive: As above Negative: As above  Physical Exam  BP (!) 108/98   Pulse 100   Temp 98.7 F (37.1 C) (Oral)   Resp 18   Ht 5\' 8"  (1.727 m)   Wt 73 kg   SpO2 99%   BMI 24.47 kg/m  Gen:   Awake, no distress   Resp:  Normal effort  MSK:   Moves extremities without difficulty  Other:  Slight tachycardia  Medical Decision Making  Medically screening exam initiated at 7:10 PM.  Appropriate orders placed.  Martin [name] was informed that the remainder of the evaluation will be completed by another provider, this initial triage assessment does not replace that evaluation, and the importance of remaining in the ED until their evaluation is complete.     Martin Merles, PA-C 12/03/22 1912

## 2022-12-04 ENCOUNTER — Emergency Department (HOSPITAL_COMMUNITY): Payer: Medicare HMO

## 2022-12-04 LAB — TROPONIN I (HIGH SENSITIVITY): Troponin I (High Sensitivity): 6 ng/L (ref <18)

## 2022-12-04 NOTE — ED Notes (Signed)
EDP at bedside  

## 2022-12-04 NOTE — ED Notes (Signed)
Pt transported to xray

## 2022-12-04 NOTE — Telephone Encounter (Signed)
Pt evaluated and stable.  Remote transmission received and WNL.

## 2022-12-04 NOTE — ED Provider Notes (Signed)
EMERGENCY DEPARTMENT AT [facility] Provider Note   CSN: [id number] Arrival date & time: 12/03/22  1841     History  Chief Complaint  Patient presents with   Anxiety    Martin [name] is a 77 y.o. male.  The history is provided by the patient.  Anxiety This is a new problem. The current episode started more than 1 week ago (about a month ago when the pacemaker was inserted). The problem occurs constantly. The problem has not changed since onset.Pertinent negatives include no chest pain, no abdominal pain, no headaches and no shortness of breath. Nothing aggravates the symptoms. Nothing relieves the symptoms. He has tried nothing for the symptoms. The treatment provided no relief.  Patient with HTN and pacemaker in August who presents with ongoing anxiety related to insertion and whether it is working.  Concerns about use of microwave.     Past Medical History:  Diagnosis Date   Allergic rhinitis due to allergen    Anxiety    BPH (benign prostatic hyperplasia)    Caregiver stress    Cataract    DDD (degenerative disc disease), cervical    Depressed    Diabetes mellitus without complication (HCC)    Dyspnea    Glaucoma    Bil.   Hyperlipidemia    Hypertension    Impaired glucose tolerance 11/20/2010   Metabolic syndrome    Myofascial pain dysfunction syndrome      Home Medications Prior to Admission medications   Medication Sig Start Date End Date Taking? Authorizing Provider  hydrochlorothiazide (HYDRODIURIL) 25 MG tablet Take 25 mg by mouth daily.    [provider]  latanoprost (XALATAN) 0.005 % ophthalmic solution Place 1 drop into both eyes at bedtime.    [provider]  losartan (COZAAR) 25 MG tablet Take 1 tablet (25 mg total) by mouth daily. 11/13/22   Graciella Freer, PA-C  metFORMIN (GLUCOPHAGE) 500 MG tablet Take 1 tablet (500 mg total) by mouth daily with breakfast. 03/05/15   Dorna Leitz, PA-C  metoprolol  succinate (TOPROL XL) 25 MG 24 hr tablet Take 0.5 tablets (12.5 mg total) by mouth at bedtime. 11/13/22   Graciella Freer, PA-C  sertraline (ZOLOFT) 100 MG tablet Take 100 mg by mouth daily. 06/01/21   [provider]  sertraline (ZOLOFT) 50 MG tablet Take 50 mg by mouth every evening. 03/31/17   [provider]  simvastatin (ZOCOR) 40 MG tablet Take 1 tablet (40 mg total) by mouth at bedtime. 08/14/15   Ethelda Chick, MD      Allergies    Enalapril maleate    Review of Systems   Review of Systems  Constitutional:  Negative for fever.  Respiratory:  Negative for shortness of breath, wheezing and stridor.   Cardiovascular:  Negative for chest pain.  Gastrointestinal:  Negative for abdominal pain.  Neurological:  Negative for headaches.  Psychiatric/Behavioral:  Negative for suicidal ideas. The patient is nervous/anxious.   All other systems reviewed and are negative.   Physical Exam Updated Vital Signs BP 110/72 (BP Location: Left Arm)   Pulse 78   Temp 98.3 F (36.8 C) (Oral)   Resp 18   Ht 5\' 8"  (1.727 m)   Wt 73 kg   SpO2 97%   BMI 24.47 kg/m  Physical Exam Vitals and nursing note reviewed.  Constitutional:      General: He is not in acute distress.    Appearance: Normal appearance.  He is well-developed. He is not diaphoretic.  HENT:     Head: Normocephalic and atraumatic.     Nose: Nose normal.  Eyes:     Conjunctiva/sclera: Conjunctivae normal.     Pupils: Pupils are equal, round, and reactive to light.  Cardiovascular:     Rate and Rhythm: Normal rate and regular rhythm.     Pulses: Normal pulses.     Heart sounds: Normal heart sounds.  Pulmonary:     Effort: Pulmonary effort is normal.     Breath sounds: Normal breath sounds. No wheezing or rales.  Abdominal:     General: Bowel sounds are normal.     Palpations: Abdomen is soft.     Tenderness: There is no abdominal tenderness. There is no guarding or rebound.  Musculoskeletal:         General: Normal range of motion.     Cervical back: Normal range of motion and neck supple.  Skin:    General: Skin is warm and dry.     Capillary Refill: Capillary refill takes less than 2 seconds.  Neurological:     General: No focal deficit present.     Mental Status: He is alert and oriented to person, place, and time.     Deep Tendon Reflexes: Reflexes normal.  Psychiatric:        Mood and Affect: Mood is anxious.     Comments: Hyperventilating and visibly anxious      ED Results / Procedures / Treatments   Labs (all labs ordered are listed, but only abnormal results are displayed) Results for orders placed or performed during the hospital encounter of 12/03/22  CBC  Result Value Ref Range   WBC 10.7 (H) 4.0 - 10.5 K/uL   RBC 4.84 4.22 - 5.81 MIL/uL   Hemoglobin 15.1 13.0 - 17.0 g/dL   HCT 41.3 24.4 - 01.0 %   MCV 93.2 80.0 - 100.0 fL   MCH 31.2 26.0 - 34.0 pg   MCHC 33.5 30.0 - 36.0 g/dL   RDW 27.2 53.6 - 64.4 %   Platelets 211 150 - 400 K/uL   nRBC 0.0 0.0 - 0.2 %  Basic metabolic panel  Result Value Ref Range   Sodium 132 (L) 135 - 145 mmol/L   Potassium 3.8 3.5 - 5.1 mmol/L   Chloride 97 (L) 98 - 111 mmol/L   CO2 22 22 - 32 mmol/L   Glucose, Bld 109 (H) 70 - 99 mg/dL   BUN 21 8 - 23 mg/dL   Creatinine, Ser 0.34 0.61 - 1.24 mg/dL   Calcium 9.3 8.9 - 74.2 mg/dL   GFR, Estimated >59 >56 mL/min   Anion gap 13 5 - 15  Troponin I (High Sensitivity)  Result Value Ref Range   Troponin I (High Sensitivity) 7 <18 ng/L  Troponin I (High Sensitivity)  Result Value Ref Range   Troponin I (High Sensitivity) 6 <18 ng/L   DG Chest Portable 1 View  Result Date: 12/04/2022 CLINICAL DATA:  Encounter for post pacemaker. EXAM: PORTABLE CHEST 1 VIEW COMPARISON:  AP Lat chest 11/10/2022, which was also post pacemaker. FINDINGS: A left chest dual lead pacing system is again noted. The leads once again terminating in the plane of the right atrium and ventricle. There is no  pneumothorax. The cardiac size is normal. The mediastinum is stable. There are bilateral epipericardial fat pads and slight aortic tortuosity. There are scattered aortic calcific plaques.  The lungs are clear. There is mild  thoracic levoscoliosis with degenerative changes of the thoracic spine. No new osseous findings. IMPRESSION: Left chest dual lead pacing system with leads terminating in the plane of the right atrium and ventricle. No pneumothorax or acute chest findings. Electronically Signed   By: Almira Bar M.D.   On: 12/04/2022 00:59   CUP PACEART INCLINIC DEVICE CHECK  Result Date: 11/19/2022 Wound check appointment. Steri-strips removed. Wound without redness or edema. Incision edges approximated, wound well healed. Normal device function. Thresholds, sensing, and impedances consistent with implant measurements. Device programmed at 3.5V/auto capture programmed on for extra safety margin until 3 month visit. Histogram distribution appropriate for patient and level of activity. 1 AMS episode appears AT; however, there is noted oversensing of FF R waves. See programming change below. No high ventricular rates noted. Patient educated about wound care, arm mobility, lifting restrictions. ROV in 3 months with implanting physician. PROGRAMMING CHANGE: Turned on AUTO SENSE in Lynn Ito, RN  DG Chest 2 View  Result Date: 11/10/2022 CLINICAL DATA:  Status post pacemaker EXAM: CHEST - 2 VIEW COMPARISON:  11/08/2022 FINDINGS: Gross cardiomegaly. Left chest multi lead pacer, tips generally projecting in the expected vicinity of the right atrium and right ventricle. No acute abnormality of the lungs. Disc degenerative disease of the thoracic spine. IMPRESSION: 1. Left chest multi lead pacer, tips generally projecting in the expected vicinity of the right atrium and right ventricle. 2. Gross cardiomegaly. 3. No acute abnormality of the lungs. Electronically Signed   By: Jearld Lesch M.D.   On:  11/10/2022 09:22   EP PPM/ICD IMPLANT  Result Date: 11/09/2022  CONCLUSIONS:  1. Symptomatic bradycardia due to complete heart block  2. Successful implant of an Abbott DDD PPM with left bundle area lead  3.  No early apparent complications.   ECHOCARDIOGRAM COMPLETE  Result Date: 11/09/2022    ECHOCARDIOGRAM REPORT   Patient Name:   EZECHIEL BENSING Date of Exam: 11/09/2022 Medical Rec #:  [id number]        Height:       68.0 in Accession #:    [id number]       Weight:       177.2 lb Date of Birth:  09/12/1945         BSA:          1.941 m Patient Age:    77 years         BP:           140/53 mmHg Patient Gender: M                HR:           48 bpm. Exam Location:  Inpatient Procedure: 2D Echo, Cardiac Doppler and Color Doppler Indications:    Heart Block, Complete I44.2  History:        Patient has no prior history of Echocardiogram examinations.                 Risk Factors:Diabetes, Hypertension and Dyslipidemia.  Sonographer:    Harriette Bouillon RDCS Referring Phys: [id number] MICHAEL ANDREW TILLERY IMPRESSIONS  1. Left ventricular ejection fraction, by estimation, is 60 to 65%. The left ventricle has normal function. The left ventricle has no regional wall motion abnormalities. Left ventricular diastolic function could not be evaluated.  2. Right ventricular systolic function is normal. The right ventricular size is normal. Tricuspid regurgitation signal is inadequate for assessing PA pressure.  3. The mitral valve is normal in structure.  Trivial mitral valve regurgitation. No evidence of mitral stenosis.  4. The aortic valve is tricuspid. Aortic valve regurgitation is not visualized. Aortic valve sclerosis/calcification is present, without any evidence of aortic stenosis.  5. The inferior vena cava is normal in size with <50% respiratory variability, suggesting right atrial pressure of 8 mmHg. FINDINGS  Left Ventricle: Left ventricular ejection fraction, by estimation, is 60 to 65%. The left ventricle has  normal function. The left ventricle has no regional wall motion abnormalities. The left ventricular internal cavity size was normal in size. There is  no left ventricular hypertrophy. Left ventricular diastolic function could not be evaluated. Right Ventricle: The right ventricular size is normal. No increase in right ventricular wall thickness. Right ventricular systolic function is normal. Tricuspid regurgitation signal is inadequate for assessing PA pressure. Left Atrium: Left atrial size was normal in size. Right Atrium: Right atrial size was normal in size. Pericardium: There is no evidence of pericardial effusion. Mitral Valve: The mitral valve is normal in structure. Trivial mitral valve regurgitation. No evidence of mitral valve stenosis. Tricuspid Valve: The tricuspid valve is normal in structure. Tricuspid valve regurgitation is not demonstrated. No evidence of tricuspid stenosis. Aortic Valve: The aortic valve is tricuspid. Aortic valve regurgitation is not visualized. Aortic valve sclerosis/calcification is present, without any evidence of aortic stenosis. Pulmonic Valve: The pulmonic valve was normal in structure. Pulmonic valve regurgitation is trivial. No evidence of pulmonic stenosis. Aorta: The aortic root is normal in size and structure. Venous: The inferior vena cava is normal in size with less than 50% respiratory variability, suggesting right atrial pressure of 8 mmHg. IAS/Shunts: The interatrial septum appears to be lipomatous. No atrial level shunt detected by color flow Doppler.  LEFT VENTRICLE PLAX 2D LVIDd:         4.90 cm LVIDs:         3.00 cm LV PW:         0.90 cm LV IVS:        0.90 cm LVOT diam:     2.20 cm LV SV:         85 LV SV Index:   44 LVOT Area:     3.80 cm  RIGHT VENTRICLE             IVC RV S prime:     11.70 cm/s  IVC diam: 1.90 cm TAPSE (M-mode): 2.5 cm LEFT ATRIUM             Index        RIGHT ATRIUM           Index LA diam:        3.40 cm 1.75 cm/m   RA Area:      16.60 cm LA Vol (A2C):   36.6 ml 18.85 ml/m  RA Volume:   48.20 ml  24.83 ml/m LA Vol (A4C):   45.6 ml 23.49 ml/m LA Biplane Vol: 41.8 ml 21.53 ml/m  AORTIC VALVE LVOT Vmax:   109.00 cm/s LVOT Vmean:  71.000 cm/s LVOT VTI:    0.224 m  AORTA Ao Root diam: 3.40 cm Ao Asc diam:  3.20 cm  SHUNTS Systemic VTI:  0.22 m Systemic Diam: 2.20 cm Armanda Magic MD Electronically signed by Armanda Magic MD Signature Date/Time: 11/09/2022/9:00:38 AM    Final    DG Chest Port 1 View  Result Date: 11/08/2022 CLINICAL DATA:  77 year old male with history of chest pain. EXAM: PORTABLE CHEST 1 VIEW COMPARISON:  Chest x-ray  03/13/2021. FINDINGS: Transcutaneous defibrillator pads project over the central aspect of the chest. Lung volumes are normal. Poorly defined opacity in the left lung base may reflect atelectasis and/or consolidation, likely with small left pleural effusion. Right lung appears clear. No appreciable pneumothorax. No evidence of pulmonary edema. Heart size is normal. Mediastinal contours are obscured. IMPRESSION: 1. Atelectasis and/or consolidation in the left lung base with small left pleural effusion. Electronically Signed   By: Trudie Reed M.D.   On: 11/08/2022 08:04     Radiology DG Chest Portable 1 View  Result Date: 12/04/2022 CLINICAL DATA:  Encounter for post pacemaker. EXAM: PORTABLE CHEST 1 VIEW COMPARISON:  AP Lat chest 11/10/2022, which was also post pacemaker. FINDINGS: A left chest dual lead pacing system is again noted. The leads once again terminating in the plane of the right atrium and ventricle. There is no pneumothorax. The cardiac size is normal. The mediastinum is stable. There are bilateral epipericardial fat pads and slight aortic tortuosity. There are scattered aortic calcific plaques.  The lungs are clear. There is mild thoracic levoscoliosis with degenerative changes of the thoracic spine. No new osseous findings. IMPRESSION: Left chest dual lead pacing system with leads  terminating in the plane of the right atrium and ventricle. No pneumothorax or acute chest findings. Electronically Signed   By: Almira Bar M.D.   On: 12/04/2022 00:59    Procedures Procedures    Medications Ordered in ED Medications - No data to display  EKG 99 BPM 100 PERCENT ATRIAL PACED RHYTHM   ED Course/ Medical Decision Making/ A&P                                 Medical Decision Making Anxiety and hyperventilation related to pacer insertion about a month ago   Amount and/or Complexity of Data Reviewed Independent Historian: EMS External Data Reviewed: labs, ECG and notes.    Details: Previous notes and labs  Labs: ordered.    Details: Troponins 7 then 6, normal.  White count top normal 10.7, hemoglobin 15.1 normal,normal platelets. Sodium 132, potassium normal 3.8, normal creatinine  Radiology: ordered and independent interpretation performed.    Details: Normal CXR by me  ECG/medicine tests: ordered and independent interpretation performed. Decision-making details documented in ED Course.  Risk Risk Details: Pacer is pacing patient.  No tenderness in the pocket.  No swelling or bruising.  Labs and imaging and EKG are benign and reassuring.  I have advised patient to slow his breathing.  He is anxious follow up with PMD and cardiology for ongoing care.      Final Clinical Impression(s) / ED Diagnoses Final diagnoses:  Physically well but worried    I have reviewed the triage vital signs and the nursing notes. Pertinent labs & imaging results that were available during my care of the patient were reviewed by me and considered in my medical decision making (see chart for details). After history, exam, and medical workup I feel the patient has been appropriately medically screened and is safe for discharge home. Pertinent diagnoses were discussed with the patient. Patient was given return precautions.      Rx / DC Orders ED Discharge Orders     None          Najae Filsaime, MD 12/04/22 [phone]

## 2022-12-04 NOTE — ED Notes (Signed)
Pt given cab voucher. Stated he can't get into his house without his walker. Pt stated he will call someone before he leaves in cab to meet him at his house to retrieve his walker. Cab will not be available until 6/7 am. Pt's friend unavailable until 6/7 am Pt will call friend at 6/7 am when taxi arrives.

## 2022-12-05 ENCOUNTER — Encounter (HOSPITAL_COMMUNITY): Payer: Self-pay

## 2022-12-05 ENCOUNTER — Other Ambulatory Visit: Payer: Self-pay

## 2022-12-05 ENCOUNTER — Emergency Department (HOSPITAL_COMMUNITY): Payer: Medicare HMO

## 2022-12-05 ENCOUNTER — Emergency Department (HOSPITAL_COMMUNITY)
Admission: EM | Admit: 2022-12-05 | Discharge: 2022-12-05 | Disposition: A | Discharge status: Home / Self Care | Payer: Medicare HMO | Attending: Emergency Medicine | Admitting: Emergency Medicine

## 2022-12-05 DIAGNOSIS — I2510 Atherosclerotic heart disease of native coronary artery without angina pectoris: Secondary | ICD-10-CM | POA: Diagnosis not present

## 2022-12-05 DIAGNOSIS — I10 Essential (primary) hypertension: Secondary | ICD-10-CM | POA: Diagnosis not present

## 2022-12-05 DIAGNOSIS — E119 Type 2 diabetes mellitus without complications: Secondary | ICD-10-CM | POA: Insufficient documentation

## 2022-12-05 DIAGNOSIS — R079 Chest pain, unspecified: Secondary | ICD-10-CM | POA: Diagnosis present

## 2022-12-05 DIAGNOSIS — Z7984 Long term (current) use of oral hypoglycemic drugs: Secondary | ICD-10-CM | POA: Diagnosis not present

## 2022-12-05 DIAGNOSIS — M25512 Pain in left shoulder: Secondary | ICD-10-CM

## 2022-12-05 DIAGNOSIS — Z79899 Other long term (current) drug therapy: Secondary | ICD-10-CM | POA: Insufficient documentation

## 2022-12-05 LAB — CBC
HCT: 41.9 % (ref 39.0–52.0)
Hemoglobin: 14.4 g/dL (ref 13.0–17.0)
MCH: 31.7 pg (ref 26.0–34.0)
MCHC: 34.4 g/dL (ref 30.0–36.0)
MCV: 92.3 fL (ref 80.0–100.0)
Platelets: 229 10*3/uL (ref 150–400)
RBC: 4.54 MIL/uL (ref 4.22–5.81)
RDW: 12.9 % (ref 11.5–15.5)
WBC: 9.7 10*3/uL (ref 4.0–10.5)
nRBC: 0 % (ref 0.0–0.2)

## 2022-12-05 LAB — BASIC METABOLIC PANEL
Anion gap: 11 (ref 5–15)
BUN: 30 mg/dL — ABNORMAL HIGH (ref 8–23)
CO2: 24 mmol/L (ref 22–32)
Calcium: 8.3 mg/dL — ABNORMAL LOW (ref 8.9–10.3)
Chloride: 102 mmol/L (ref 98–111)
Creatinine, Ser: 1.14 mg/dL (ref 0.61–1.24)
GFR, Estimated: >60 mL/min (ref >60)
Glucose, Bld: 100 mg/dL — ABNORMAL HIGH (ref 70–99)
Potassium: 5.1 mmol/L (ref 3.5–5.1)
Sodium: 137 mmol/L (ref 135–145)

## 2022-12-05 LAB — TROPONIN I (HIGH SENSITIVITY)
Troponin I (High Sensitivity): 6 ng/L (ref <18)
Troponin I (High Sensitivity): 6 ng/L (ref <18)

## 2022-12-05 LAB — D-DIMER, QUANTITATIVE: D-Dimer, Quant: 1.98 ug/mL-FEU — ABNORMAL HIGH (ref 0.00–0.50)

## 2022-12-05 MED ORDER — IOHEXOL 350 MG/ML SOLN
75.0000 mL | Freq: Once | INTRAVENOUS | Status: AC | PRN
  Administered 2022-12-05: 75 mL via INTRAVENOUS

## 2022-12-05 NOTE — ED Provider Notes (Addendum)
Care assumed from previous provider.  See note for full HPI.  In summation 77 year old here for evaluation of chest pain.  Has been intermittent since having his pacemaker placed 1 month ago.  He was seen 2 days ago for similar.  Thought pain was likely due to anxiety at that time.  He returns today due to persistent pain.  Plan on follow-up on labs, imaging, EKG.   Physical Exam  BP (!) 90/54 (BP Location: Right Arm)   Pulse 80   Temp 98.2 F (36.8 C) (Oral)   Resp 16   Ht 5\' 8"  (1.727 m)   Wt 73 kg   SpO2 98%   BMI 24.47 kg/m   Physical Exam Vitals and nursing note reviewed.  Constitutional:      General: He is not in acute distress.    Appearance: He is well-developed. He is not ill-appearing or diaphoretic.  HENT:     Head: Atraumatic.  Eyes:     Pupils: Pupils are equal, round, and reactive to light.  Cardiovascular:     Rate and Rhythm: Normal rate and regular rhythm.     Pulses:          Radial pulses are 2+ on the right side and 2+ on the left side.       Dorsalis pedis pulses are 2+ on the right side and 2+ on the left side.  Pulmonary:     Effort: Pulmonary effort is normal. No respiratory distress.  Chest:     Comments: Pacemaker site to left upper chest wall without erythema, warmth, drainage.  Wound healing without any dehiscence Abdominal:     General: There is no distension.     Palpations: Abdomen is soft.  Musculoskeletal:        General: Normal range of motion.     Cervical back: Normal range of motion and neck supple.     Right lower leg: No tenderness. No edema.     Left lower leg: No tenderness. No edema.  Skin:    General: Skin is warm and dry.  Neurological:     General: No focal deficit present.     Mental Status: He is alert and oriented to person, place, and time.     Procedures  Procedures Labs Reviewed  D-DIMER, QUANTITATIVE (NOT AT [facility]) - Abnormal; Notable for the following components:      Result Value   D-Dimer, Quant 1.98 (*)     All other components within normal limits  BASIC METABOLIC PANEL - Abnormal; Notable for the following components:   Glucose, Bld 100 (*)    BUN 30 (*)    Calcium 8.3 (*)    All other components within normal limits  CBC  TROPONIN I (HIGH SENSITIVITY)  TROPONIN I (HIGH SENSITIVITY)   CT Angio Chest PE W and/or Wo Contrast  Result Date: 12/05/2022 CLINICAL DATA:  Positive D-dimer. EXAM: CT ANGIOGRAPHY CHEST WITH CONTRAST TECHNIQUE: Multidetector CT imaging of the chest was performed using the standard protocol during bolus administration of intravenous contrast. Multiplanar CT image reconstructions and MIPs were obtained to evaluate the vascular anatomy. RADIATION DOSE REDUCTION: This exam was performed according to the departmental dose-optimization program which includes automated exposure control, adjustment of the mA and/or kV according to patient size and/or use of iterative reconstruction technique. CONTRAST:  75mL OMNIPAQUE IOHEXOL 350 MG/ML SOLN COMPARISON:  Chest x-ray same day FINDINGS: Cardiovascular: Satisfactory opacification of the pulmonary arteries to the segmental level. No evidence of pulmonary embolism.  Normal heart size. No pericardial effusion. There are atherosclerotic calcifications of the aorta. Left-sided pacemaker is present. Mediastinum/Nodes: No enlarged mediastinal, hilar, or axillary lymph nodes. Thyroid gland, trachea, and esophagus demonstrate no significant findings. There is a small hiatal hernia. Lungs/Pleura: There some scattered ground-glass opacities in both lung bases. The lungs are otherwise clear. There is no pleural effusion or pneumothorax. Upper Abdomen: No acute abnormality. Musculoskeletal: No chest wall abnormality. No acute or significant osseous findings. Review of the MIP images confirms the above findings. IMPRESSION: 1. No evidence for pulmonary embolism. 2. Scattered ground-glass opacities in both lung bases, likely infectious/inflammatory. 3. Small  hiatal hernia. 4. Aortic atherosclerosis. Aortic Atherosclerosis (ICD10-I70.0). Electronically Signed   By: Darliss Cheney M.D.   On: 12/05/2022 19:06   DG Chest Portable 1 View  Result Date: 12/05/2022 CLINICAL DATA:  Chest pain. EXAM: PORTABLE CHEST 1 VIEW COMPARISON:  12/04/2022. FINDINGS: Bilateral lung fields are clear. Bilateral costophrenic angles are clear. Stable cardio-mediastinal silhouette. There is a left sided 2-lead pacemaker. No acute osseous abnormalities. The soft tissues are within normal limits. IMPRESSION: No active disease. Electronically Signed   By: Jules Schick M.D.   On: 12/05/2022 14:51   DG Chest Portable 1 View  Result Date: 12/04/2022 CLINICAL DATA:  Encounter for post pacemaker. EXAM: PORTABLE CHEST 1 VIEW COMPARISON:  AP Lat chest 11/10/2022, which was also post pacemaker. FINDINGS: A left chest dual lead pacing system is again noted. The leads once again terminating in the plane of the right atrium and ventricle. There is no pneumothorax. The cardiac size is normal. The mediastinum is stable. There are bilateral epipericardial fat pads and slight aortic tortuosity. There are scattered aortic calcific plaques.  The lungs are clear. There is mild thoracic levoscoliosis with degenerative changes of the thoracic spine. No new osseous findings. IMPRESSION: Left chest dual lead pacing system with leads terminating in the plane of the right atrium and ventricle. No pneumothorax or acute chest findings. Electronically Signed   By: Almira Bar M.D.   On: 12/04/2022 00:59   CUP PACEART INCLINIC DEVICE CHECK  Result Date: 11/19/2022 Wound check appointment. Steri-strips removed. Wound without redness or edema. Incision edges approximated, wound well healed. Normal device function. Thresholds, sensing, and impedances consistent with implant measurements. Device programmed at 3.5V/auto capture programmed on for extra safety margin until 3 month visit. Histogram distribution  appropriate for patient and level of activity. 1 AMS episode appears AT; however, there is noted oversensing of FF R waves. See programming change below. No high ventricular rates noted. Patient educated about wound care, arm mobility, lifting restrictions. ROV in 3 months with implanting physician. PROGRAMMING CHANGE: Turned on AUTO SENSE in Lynn Ito, RN  DG Chest 2 View  Result Date: 11/10/2022 CLINICAL DATA:  Status post pacemaker EXAM: CHEST - 2 VIEW COMPARISON:  11/08/2022 FINDINGS: Gross cardiomegaly. Left chest multi lead pacer, tips generally projecting in the expected vicinity of the right atrium and right ventricle. No acute abnormality of the lungs. Disc degenerative disease of the thoracic spine. IMPRESSION: 1. Left chest multi lead pacer, tips generally projecting in the expected vicinity of the right atrium and right ventricle. 2. Gross cardiomegaly. 3. No acute abnormality of the lungs. Electronically Signed   By: Jearld Lesch M.D.   On: 11/10/2022 09:22   EP PPM/ICD IMPLANT  Result Date: 11/09/2022  CONCLUSIONS:  1. Symptomatic bradycardia due to complete heart block  2. Successful implant of an Abbott DDD PPM with left bundle area  lead  3.  No early apparent complications.   ECHOCARDIOGRAM COMPLETE  Result Date: 11/09/2022    ECHOCARDIOGRAM REPORT   Patient Name:   LYNNOX BOYKO Date of Exam: 11/09/2022 Medical Rec #:  [id number]        Height:       68.0 in Accession #:    [id number]       Weight:       177.2 lb Date of Birth:  February 27, 1946         BSA:          1.941 m Patient Age:    77 years         BP:           140/53 mmHg Patient Gender: M                HR:           48 bpm. Exam Location:  Inpatient Procedure: 2D Echo, Cardiac Doppler and Color Doppler Indications:    Heart Block, Complete I44.2  History:        Patient has no prior history of Echocardiogram examinations.                 Risk Factors:Diabetes, Hypertension and Dyslipidemia.  Sonographer:    Harriette Bouillon  RDCS Referring Phys: [id number] MICHAEL ANDREW TILLERY IMPRESSIONS  1. Left ventricular ejection fraction, by estimation, is 60 to 65%. The left ventricle has normal function. The left ventricle has no regional wall motion abnormalities. Left ventricular diastolic function could not be evaluated.  2. Right ventricular systolic function is normal. The right ventricular size is normal. Tricuspid regurgitation signal is inadequate for assessing PA pressure.  3. The mitral valve is normal in structure. Trivial mitral valve regurgitation. No evidence of mitral stenosis.  4. The aortic valve is tricuspid. Aortic valve regurgitation is not visualized. Aortic valve sclerosis/calcification is present, without any evidence of aortic stenosis.  5. The inferior vena cava is normal in size with <50% respiratory variability, suggesting right atrial pressure of 8 mmHg. FINDINGS  Left Ventricle: Left ventricular ejection fraction, by estimation, is 60 to 65%. The left ventricle has normal function. The left ventricle has no regional wall motion abnormalities. The left ventricular internal cavity size was normal in size. There is  no left ventricular hypertrophy. Left ventricular diastolic function could not be evaluated. Right Ventricle: The right ventricular size is normal. No increase in right ventricular wall thickness. Right ventricular systolic function is normal. Tricuspid regurgitation signal is inadequate for assessing PA pressure. Left Atrium: Left atrial size was normal in size. Right Atrium: Right atrial size was normal in size. Pericardium: There is no evidence of pericardial effusion. Mitral Valve: The mitral valve is normal in structure. Trivial mitral valve regurgitation. No evidence of mitral valve stenosis. Tricuspid Valve: The tricuspid valve is normal in structure. Tricuspid valve regurgitation is not demonstrated. No evidence of tricuspid stenosis. Aortic Valve: The aortic valve is tricuspid. Aortic valve  regurgitation is not visualized. Aortic valve sclerosis/calcification is present, without any evidence of aortic stenosis. Pulmonic Valve: The pulmonic valve was normal in structure. Pulmonic valve regurgitation is trivial. No evidence of pulmonic stenosis. Aorta: The aortic root is normal in size and structure. Venous: The inferior vena cava is normal in size with less than 50% respiratory variability, suggesting right atrial pressure of 8 mmHg. IAS/Shunts: The interatrial septum appears to be lipomatous. No atrial level shunt detected by color flow Doppler.  LEFT VENTRICLE  PLAX 2D LVIDd:         4.90 cm LVIDs:         3.00 cm LV PW:         0.90 cm LV IVS:        0.90 cm LVOT diam:     2.20 cm LV SV:         85 LV SV Index:   44 LVOT Area:     3.80 cm  RIGHT VENTRICLE             IVC RV S prime:     11.70 cm/s  IVC diam: 1.90 cm TAPSE (M-mode): 2.5 cm LEFT ATRIUM             Index        RIGHT ATRIUM           Index LA diam:        3.40 cm 1.75 cm/m   RA Area:     16.60 cm LA Vol (A2C):   36.6 ml 18.85 ml/m  RA Volume:   48.20 ml  24.83 ml/m LA Vol (A4C):   45.6 ml 23.49 ml/m LA Biplane Vol: 41.8 ml 21.53 ml/m  AORTIC VALVE LVOT Vmax:   109.00 cm/s LVOT Vmean:  71.000 cm/s LVOT VTI:    0.224 m  AORTA Ao Root diam: 3.40 cm Ao Asc diam:  3.20 cm  SHUNTS Systemic VTI:  0.22 m Systemic Diam: 2.20 cm Armanda Magic MD Electronically signed by Armanda Magic MD Signature Date/Time: 11/09/2022/9:00:38 AM    Final    DG Chest Port 1 View  Result Date: 11/08/2022 CLINICAL DATA:  77 year old male with history of chest pain. EXAM: PORTABLE CHEST 1 VIEW COMPARISON:  Chest x-ray 03/13/2021. FINDINGS: Transcutaneous defibrillator pads project over the central aspect of the chest. Lung volumes are normal. Poorly defined opacity in the left lung base may reflect atelectasis and/or consolidation, likely with small left pleural effusion. Right lung appears clear. No appreciable pneumothorax. No evidence of pulmonary edema.  Heart size is normal. Mediastinal contours are obscured. IMPRESSION: 1. Atelectasis and/or consolidation in the left lung base with small left pleural effusion. Electronically Signed   By: Trudie Reed M.D.   On: 11/08/2022 08:04    ED Course / MDM   Clinical Course as of 12/05/22 2256  Sat Dec 05, 2022  2015 Cards to see at bedside [BH]    Clinical Course User Index [BH] Macintyre Alexa A, PA-C    Care assumed from previous provider.  See note for full HPI.  In summation 77 year old here for evaluation of chest pain.  Has been intermittent since having his pacemaker placed 1 month ago.  He was seen 2 days ago for similar.  Thought pain was likely due to anxiety at that time.  He returns today due to persistent pain.  Plan on follow-up on labs, imaging, EKG.  Labs and Imaging personally viewed and interpreted CTA showed possible infection versus formation.  Patient denies any shortness of breath, cough.  Will hold off on antibiotics at this time with a leukocytosis, no fever.  Discussed with cardiology (Dr. Elayne Guerin), see note in chart.  Do not think that patient's pain is related to his pacemaker or cardiac in nature.  Will have him follow-up outpatient, return for new or worsening symptoms  The patient has been appropriately medically screened and/or stabilized in the ED. I have low suspicion for any other emergent medical condition which would require further screening,  evaluation or treatment in the ED or require inpatient management.  Patient is hemodynamically stable and in no acute distress.  Patient able to ambulate in department prior to ED.  Evaluation does not show acute pathology that would require ongoing or additional emergent interventions while in the emergency department or further inpatient treatment.  I have discussed the diagnosis with the patient and answered all questions.  Pain is been managed while in the emergency department and patient has no further complaints  prior to discharge.  Patient is comfortable with plan discussed in room and is stable for discharge at this time.  I have discussed strict return precautions for returning to the emergency department.  Patient was encouraged to follow-up with PCP/specialist refer to at discharge.   Paramedics stated that he had gotten orthostatic vital vital signs on patient where he dropped to 90 systolic.  I assessed patient.  Patient states he felt "fine."  He is ambulatory here.  He was given food, drink.  Blood pressure 140 systolic.  I suspect his earlier blood pressure reading erroneous as when he had multiple repeat ones they are within normal limits.  Friend at bedside did talk about getting increased home health for him.  Orders placed for face-to-face.     Medical Decision Making Amount and/or Complexity of Data Reviewed External Data Reviewed: labs, radiology, ECG and notes. Labs: ordered. Decision-making details documented in ED Course. Radiology: ordered and independent interpretation performed. Decision-making details documented in ED Course. ECG/medicine tests: ordered and independent interpretation performed. Decision-making details documented in ED Course.  Risk OTC drugs. Prescription drug management. Decision regarding hospitalization. Diagnosis or treatment significantly limited by social determinants of health.       Linwood Dibbles, PA-C 12/05/22 2257    Tegeler, Canary Brim, MD 12/05/22 [phone]

## 2022-12-05 NOTE — Consult Note (Signed)
Cardiology Consultation   Patient ID: BOBY PODGORNY MRN: [id number]; DOB: Feb 28, 1946  Admit date: 12/05/2022 Date of Consult: 12/05/2022  PCP:  Tracey Harries, MD   [facility] Providers Cardiologist:  None        Patient Profile:   Martin [name] is a 77 y.o. male with a hx of complete heart status post dual-chamber pacemaker, diabetes, hypertension, hyperlipidemia who is being seen 12/05/2022 for the evaluation of left shoulder pain at the request of emergency department.  History of Present Illness:   Martin [name] reports intermittent left-sided shoulder pain that radiates to the left hand.  He reports this began at some point after his pacemaker implantation about a month ago.  His pain begins at the anterior axillary line just under the deltopectoral groove with radiation to the left hand.  He reports delayed no exacerbation of pain with movement of the arm or palpation of the area.  He has no symptoms when lying in bed and sleeping.  He reports pain was exacerbated during an emergency room visit when he had to sit in the waiting room for roughly 5 hours.  He has a history of left-sided neck pain and degenerative disc disease of the cervical spine.   Past Medical History:  Diagnosis Date   Allergic rhinitis due to allergen    Anxiety    BPH (benign prostatic hyperplasia)    Caregiver stress    Cataract    DDD (degenerative disc disease), cervical    Depressed    Diabetes mellitus without complication (HCC)    Dyspnea    Glaucoma    Bil.   Hyperlipidemia    Hypertension    Impaired glucose tolerance 11/20/2010   Metabolic syndrome    Myofascial pain dysfunction syndrome     Past Surgical History:  Procedure Laterality Date   BACK SURGERY     29+years ago   CARPAL TUNNEL RELEASE Right    PACEMAKER IMPLANT N/A 11/09/2022   Procedure: PACEMAKER IMPLANT;  Surgeon: Lanier Prude, MD;  Location: [facility];  Service: Cardiovascular;  Laterality:  N/A;   SHOULDER ARTHROSCOPY   right shoulder   with bone spurs   TONSILECTOMY, ADENOIDECTOMY, BILATERAL MYRINGOTOMY AND TUBES  1959     Home Medications:  Prior to Admission medications   Medication Sig Start Date End Date Taking? Authorizing Provider  hydrochlorothiazide (HYDRODIURIL) 25 MG tablet Take 25 mg by mouth daily.    [provider]  latanoprost (XALATAN) 0.005 % ophthalmic solution Place 1 drop into both eyes at bedtime.    [provider]  losartan (COZAAR) 25 MG tablet Take 1 tablet (25 mg total) by mouth daily. 11/13/22   Graciella Freer, PA-C  metFORMIN (GLUCOPHAGE) 500 MG tablet Take 1 tablet (500 mg total) by mouth daily with breakfast. 03/05/15   Dorna Leitz, PA-C  metoprolol succinate (TOPROL XL) 25 MG 24 hr tablet Take 0.5 tablets (12.5 mg total) by mouth at bedtime. 11/13/22   Graciella Freer, PA-C  sertraline (ZOLOFT) 100 MG tablet Take 100 mg by mouth daily. 06/01/21   [provider]  sertraline (ZOLOFT) 50 MG tablet Take 50 mg by mouth every evening. 03/31/17   [provider]  simvastatin (ZOCOR) 40 MG tablet Take 1 tablet (40 mg total) by mouth at bedtime. 08/14/15   Ethelda Chick, MD    Inpatient Medications: Scheduled Meds:  Continuous Infusions:  PRN Meds:   Allergies:    Allergies  Allergen Reactions   Enalapril Maleate     Patient unsure of allergy.     Social History:   Social History   Socioeconomic History   Marital status: Single    Spouse name: Not on file   Number of children: Not on file   Years of education: Not on file   Highest education level: Not on file  Occupational History   Occupation: retired  Tobacco Use   Smoking status: Never   Smokeless tobacco: Never  Vaping Use   Vaping status: Never Used  Substance and Sexual Activity   Alcohol use: No   Drug use: No   Sexual activity: Not Currently  Other Topics Concern   Not on file  Social History Narrative   No  regular exercise.   Lives with his mother, with dementia.   Social Determinants of Health   Financial Resource Strain: Low Risk  (08/24/2022)   Received from [facility]   Overall Financial Resource Strain (CARDIA)    Difficulty of Paying Living Expenses: Not hard at all  Food Insecurity: No Food Insecurity (11/08/2022)   Hunger Vital Sign    Worried About Running Out of Food in the Last Year: Never true    Ran Out of Food in the Last Year: Never true  Transportation Needs: No Transportation Needs (11/08/2022)   PRAPARE - Administrator, Civil Service (Medical): No    Lack of Transportation (Non-Medical): No  Physical Activity: Insufficiently Active (08/24/2022)   Received from [facility]   Exercise Vital Sign    Days of Exercise per Week: 3 days    Minutes of Exercise per Session: 40 min  Stress: Stress Concern Present (08/24/2022)   Received from [facility] - Occupational Stress Questionnaire    Feeling of Stress : To some extent  Social Connections: Somewhat Isolated (08/24/2022)   Received from [facility]   Social Network    How would you rate your social network (family, work, friends)?: Restricted participation with some degree of social isolation  Intimate Partner Violence: Not At Risk (11/08/2022)   Humiliation, Afraid, Rape, and Kick questionnaire    Fear of Current or Ex-Partner: No    Emotionally Abused: No    Physically Abused: No    Sexually Abused: No    Family History:    Family History  Problem Relation Age of Onset   Alcohol abuse Other    Diabetes Other    Hyperlipidemia Other    Kidney disease Other    Coronary artery disease Other    Heart disease Mother    Dementia Mother    Heart disease Father    Coronary artery disease Father      ROS:  Please see the history of present illness.   All other ROS reviewed and negative.     Physical Exam/Data:   Vitals:   12/05/22 1658 12/05/22  1700 12/05/22 1715 12/05/22 1915  BP: 114/76 114/76 121/64 105/63  Pulse: 77 73 68 73  Resp: 18 19 14 15   Temp: 97.9 F (36.6 C)     TempSrc: Oral     SpO2: 97% 96% 96% 96%  Weight:      Height:       No intake or output data in the 24 hours ending 12/05/22 2103    12/05/2022    1:51 PM 12/03/2022    6:44 PM 11/13/2022    3:57 AM  Last 3 Weights  Weight (lbs) 160 lb 15 oz 160 lb 15 oz 161 lb 2.5 oz  Weight (kg) 73 kg 73 kg 73.1 kg     Body mass index is 24.47 kg/m.  General:  Well nourished, well developed, in no acute distress HEENT: normal Neck: no JVD Vascular: No carotid bruits; Distal pulses 2+ bilaterally Cardiac:  normal S1, S2; RRR; no murmur  Lungs:  clear to auscultation bilaterally, no wheezing, rhonchi or rales  Abd: soft, nontender, no hepatomegaly  Ext: no edema Musculoskeletal:  No deformities, BUE and BLE strength normal and equal Skin: warm and dry  Neuro:  CNs 2-12 intact, no focal abnormalities noted Psych:  Normal affect   EKG:  The EKG was personally reviewed and demonstrates:  Atrially sensed, ventricularly paced Telemetry:  Telemetry was personally reviewed and demonstrates: A sensed, V paced rhythm  Relevant CV Studies: None  Laboratory Data:  High Sensitivity Troponin:   Recent Labs  Lab 11/08/22 0932 12/03/22 1902 12/04/22 0112 12/05/22 1356 12/05/22 1545  TROPONINIHS 17 7 6 6 6      Chemistry Recent Labs  Lab 12/03/22 1902 12/05/22 1545  NA 132* 137  K 3.8 5.1  CL 97* 102  CO2 22 24  GLUCOSE 109* 100*  BUN 21 30*  CREATININE 1.08 1.14  CALCIUM 9.3 8.3*  GFRNONAA >60 >60  ANIONGAP 13 11    No results for input(s): "PROT", "ALBUMIN", "AST", "ALT", "ALKPHOS", "BILITOT" in the last 168 hours. Lipids No results for input(s): "CHOL", "TRIG", "HDL", "LABVLDL", "LDLCALC", "CHOLHDL" in the last 168 hours.  Hematology Recent Labs  Lab 12/03/22 1902 12/05/22 1356  WBC 10.7* 9.7  RBC 4.84 4.54  HGB 15.1 14.4  HCT 45.1 41.9   MCV 93.2 92.3  MCH 31.2 31.7  MCHC 33.5 34.4  RDW 12.8 12.9  PLT 211 229   Thyroid No results for input(s): "TSH", "FREET4" in the last 168 hours.  BNPNo results for input(s): "BNP", "PROBNP" in the last 168 hours.  DDimer  Recent Labs  Lab 12/05/22 1638  DDIMER 1.98*     Radiology/Studies:  CT Angio Chest PE W and/or Wo Contrast  Result Date: 12/05/2022 CLINICAL DATA:  Positive D-dimer. EXAM: CT ANGIOGRAPHY CHEST WITH CONTRAST TECHNIQUE: Multidetector CT imaging of the chest was performed using the standard protocol during bolus administration of intravenous contrast. Multiplanar CT image reconstructions and MIPs were obtained to evaluate the vascular anatomy. RADIATION DOSE REDUCTION: This exam was performed according to the departmental dose-optimization program which includes automated exposure control, adjustment of the mA and/or kV according to patient size and/or use of iterative reconstruction technique. CONTRAST:  75mL OMNIPAQUE IOHEXOL 350 MG/ML SOLN COMPARISON:  Chest x-ray same day FINDINGS: Cardiovascular: Satisfactory opacification of the pulmonary arteries to the segmental level. No evidence of pulmonary embolism. Normal heart size. No pericardial effusion. There are atherosclerotic calcifications of the aorta. Left-sided pacemaker is present. Mediastinum/Nodes: No enlarged mediastinal, hilar, or axillary lymph nodes. Thyroid gland, trachea, and esophagus demonstrate no significant findings. There is a small hiatal hernia. Lungs/Pleura: There some scattered ground-glass opacities in both lung bases. The lungs are otherwise clear. There is no pleural effusion or pneumothorax. Upper Abdomen: No acute abnormality. Musculoskeletal: No chest wall abnormality. No acute or significant osseous findings. Review of the MIP images confirms the above findings. IMPRESSION: 1. No evidence for pulmonary embolism. 2. Scattered ground-glass opacities in both lung bases, likely  infectious/inflammatory. 3. Small hiatal hernia. 4. Aortic atherosclerosis. Aortic Atherosclerosis (ICD10-I70.0). Electronically Signed   By: Linton Rump  Malena Edman M.D.   On: 12/05/2022 19:06   DG Chest Portable 1 View  Result Date: 12/05/2022 CLINICAL DATA:  Chest pain. EXAM: PORTABLE CHEST 1 VIEW COMPARISON:  12/04/2022. FINDINGS: Bilateral lung fields are clear. Bilateral costophrenic angles are clear. Stable cardio-mediastinal silhouette. There is a left sided 2-lead pacemaker. No acute osseous abnormalities. The soft tissues are within normal limits. IMPRESSION: No active disease. Electronically Signed   By: Jules Schick M.D.   On: 12/05/2022 14:51   DG Chest Portable 1 View  Result Date: 12/04/2022 CLINICAL DATA:  Encounter for post pacemaker. EXAM: PORTABLE CHEST 1 VIEW COMPARISON:  AP Lat chest 11/10/2022, which was also post pacemaker. FINDINGS: A left chest dual lead pacing system is again noted. The leads once again terminating in the plane of the right atrium and ventricle. There is no pneumothorax. The cardiac size is normal. The mediastinum is stable. There are bilateral epipericardial fat pads and slight aortic tortuosity. There are scattered aortic calcific plaques.  The lungs are clear. There is mild thoracic levoscoliosis with degenerative changes of the thoracic spine. No new osseous findings. IMPRESSION: Left chest dual lead pacing system with leads terminating in the plane of the right atrium and ventricle. No pneumothorax or acute chest findings. Electronically Signed   By: Almira Bar M.D.   On: 12/04/2022 00:59     Assessment and Plan:   Left shoulder pain -pacemaker implantation site examined with no overlying erythema, incision is well-healed, and no signs of hematoma or infection are present.  The pain is lateral to the incision site and with that history of degenerative disc disease of the cervical spine with radiation to the left hand I suspect this pain is more related to  cervical radiculopathy that pacemaker implantation.  Advised patient to call his primary care doctor and be seen by physical therapist for further treatment.   Risk Assessment/Risk Scores:             For questions or updates, please contact [facility] Please consult [email or web address] for contact info under    Signed, Roderic Palau, MD  12/05/2022 9:03 PM

## 2022-12-05 NOTE — ED Triage Notes (Signed)
Pt bib ems from home c/o left side CP that is dull. Pt has a pacemaker and states he was d/c from hospital last night with CP. Pt was given 324 mg Aspirin. Hx DM  BP 130/100 Hr 90

## 2022-12-05 NOTE — ED Notes (Signed)
If the patient is discharged, please call Debbie for pick up

## 2022-12-05 NOTE — ED Provider Notes (Signed)
[place] EMERGENCY DEPARTMENT AT [facility] Provider Note   CSN: [id number] Arrival date & time: 12/05/22  1325     History  Chief Complaint  Patient presents with   Chest Pain    Martin [name] is a 77 y.o. male history of anxiety, BPH, diabetes, hypertension, hyperlipidemia, CAD status post pacemaker implant on 11/09/2022 by Dr. Lalla Brothers sent today for evaluation of chest pain.  Patient reported he has had ongoing chest pain since pacemaker placement on 8/26.  Patient reports he is doing fine when he lays down but when he gets up he started to have pain on the left side of the chest below the pacemakers radiating down to his left arm.  He was evaluated here 2 days ago for similar reason.   Chest Pain   Past Medical History:  Diagnosis Date   Allergic rhinitis due to allergen    Anxiety    BPH (benign prostatic hyperplasia)    Caregiver stress    Cataract    DDD (degenerative disc disease), cervical    Depressed    Diabetes mellitus without complication (HCC)    Dyspnea    Glaucoma    Bil.   Hyperlipidemia    Hypertension    Impaired glucose tolerance 11/20/2010   Metabolic syndrome    Myofascial pain dysfunction syndrome    Past Surgical History:  Procedure Laterality Date   BACK SURGERY     29+years ago   CARPAL TUNNEL RELEASE Right    PACEMAKER IMPLANT N/A 11/09/2022   Procedure: PACEMAKER IMPLANT;  Surgeon: Lanier Prude, MD;  Location: [facility] INVASIVE CV LAB;  Service: Cardiovascular;  Laterality: N/A;   SHOULDER ARTHROSCOPY   right shoulder   with bone spurs   TONSILECTOMY, ADENOIDECTOMY, BILATERAL MYRINGOTOMY AND TUBES  1959     Home Medications Prior to Admission medications   Medication Sig Start Date End Date Taking? Authorizing Provider  hydrochlorothiazide (HYDRODIURIL) 25 MG tablet Take 25 mg by mouth daily.    [provider]  latanoprost (XALATAN) 0.005 % ophthalmic solution Place 1 drop into both eyes at bedtime.     [provider]  losartan (COZAAR) 25 MG tablet Take 1 tablet (25 mg total) by mouth daily. 11/13/22   Graciella Freer, PA-C  metFORMIN (GLUCOPHAGE) 500 MG tablet Take 1 tablet (500 mg total) by mouth daily with breakfast. 03/05/15   Dorna Leitz, PA-C  metoprolol succinate (TOPROL XL) 25 MG 24 hr tablet Take 0.5 tablets (12.5 mg total) by mouth at bedtime. 11/13/22   Graciella Freer, PA-C  sertraline (ZOLOFT) 100 MG tablet Take 100 mg by mouth daily. 06/01/21   [provider]  sertraline (ZOLOFT) 50 MG tablet Take 50 mg by mouth every evening. 03/31/17   [provider]  simvastatin (ZOCOR) 40 MG tablet Take 1 tablet (40 mg total) by mouth at bedtime. 08/14/15   Ethelda Chick, MD      Allergies    Enalapril maleate    Review of Systems   Review of Systems  Cardiovascular:  Positive for chest pain.    Physical Exam Updated Vital Signs BP 116/80 (BP Location: Left Arm)   Pulse 75   Temp 98.1 F (36.7 C) (Oral)   Resp 18   Ht 5\' 8"  (1.727 m)   Wt 73 kg   SpO2 97%   BMI 24.47 kg/m  Physical Exam Vitals and nursing note reviewed.  Constitutional:      Appearance: Normal  appearance.  HENT:     Head: Normocephalic and atraumatic.     Mouth/Throat:     Mouth: Mucous membranes are moist.  Eyes:     General: No scleral icterus. Cardiovascular:     Rate and Rhythm: Normal rate and regular rhythm.     Pulses: Normal pulses.     Heart sounds: Normal heart sounds.  Pulmonary:     Effort: Pulmonary effort is normal.     Breath sounds: Normal breath sounds.  Chest:     Comments: Tenderness to palpation to the chest area below the pacemaker. Abdominal:     General: Abdomen is flat.     Palpations: Abdomen is soft.     Tenderness: There is no abdominal tenderness.  Musculoskeletal:        General: No deformity.  Skin:    General: Skin is warm.     Findings: No rash.  Neurological:     General: No focal deficit present.     Mental  Status: He is alert.  Psychiatric:        Mood and Affect: Mood normal.     ED Results / Procedures / Treatments   Labs (all labs ordered are listed, but only abnormal results are displayed) Labs Reviewed  BASIC METABOLIC PANEL  CBC  TROPONIN I (HIGH SENSITIVITY)    EKG None  Radiology DG Chest Portable 1 View  Result Date: 12/04/2022 CLINICAL DATA:  Encounter for post pacemaker. EXAM: PORTABLE CHEST 1 VIEW COMPARISON:  AP Lat chest 11/10/2022, which was also post pacemaker. FINDINGS: A left chest dual lead pacing system is again noted. The leads once again terminating in the plane of the right atrium and ventricle. There is no pneumothorax. The cardiac size is normal. The mediastinum is stable. There are bilateral epipericardial fat pads and slight aortic tortuosity. There are scattered aortic calcific plaques.  The lungs are clear. There is mild thoracic levoscoliosis with degenerative changes of the thoracic spine. No new osseous findings. IMPRESSION: Left chest dual lead pacing system with leads terminating in the plane of the right atrium and ventricle. No pneumothorax or acute chest findings. Electronically Signed   By: Almira Bar M.D.   On: 12/04/2022 00:59    Procedures Procedures    Medications Ordered in ED Medications - No data to display  ED Course/ Medical Decision Making/ A&P                                 Medical Decision Making Amount and/or Complexity of Data Reviewed Labs: ordered. Radiology: ordered.   This patient presents to the ED for chest pain, this involves an extensive number of treatment options, and is a complaint that carries with a high risk of complications and morbidity.  The differential diagnosis includes ACS/MI, PE, pneumonia, pneumothorax.  This is not an exhaustive list.  Lab tests: I ordered and personally interpreted labs.  The pertinent results include: WBC unremarkable. Hbg unremarkable. Platelets unremarkable. Troponin,  D-dimer, BMP ordered and pending.  Imaging studies: Chest x-ray ordered and pending.  Problem list/ ED course/ Critical interventions/ Medical management: HPI: See above Vital signs within normal range and stable throughout visit. Laboratory/imaging studies significant for: See above. On physical examination, patient is afebrile and appears in no acute distress.  Patient presents with chest pain that has been going on for 2 weeks after his pacemaker placement.  Patient was evaluated 2 days ago with  negative workup.  He returns today with similar pain.  Labs including CBC, CMP, troponin, D-dimer ordered and pending.  Patient is resting comfortably in bed at the moment.  He is not in any acute distress. I have reviewed the patient home medicines and have made adjustments as needed.  Cardiac monitoring/EKG: The patient was maintained on a cardiac monitor.  I personally reviewed and interpreted the cardiac monitor which showed an underlying rhythm of: sinus rhythm.  Additional history obtained: External records from outside source obtained and reviewed including: Chart review including previous notes, labs, imaging.  Consultations obtained:  Disposition Patient's care signed out at shift change to [facility] with pending labs and imaging.  This chart was dictated using voice recognition software.  Despite best efforts to proofread,  errors can occur which can change the documentation meaning.          Final Clinical Impression(s) / ED Diagnoses Final diagnoses:  Chest pain, unspecified type    Rx / DC Orders ED Discharge Orders     None         Jeanelle Malling, [place] 12/05/22 1533    Loetta Rough, MD 12/06/22 2210

## 2022-12-05 NOTE — ED Notes (Deleted)
Nurse at [facility] ED called and informed that [facility] is enroute with Pt.

## 2022-12-05 NOTE — Discharge Instructions (Signed)
You were seen by cardiology.  They do not think your pain is due to your pacemaker site.  I would follow-up outpatient, return for any worsening symptoms

## 2023-02-09 ENCOUNTER — Ambulatory Visit: Payer: Medicare HMO

## 2023-02-09 DIAGNOSIS — I442 Atrioventricular block, complete: Secondary | ICD-10-CM

## 2023-02-09 LAB — CUP PACEART REMOTE DEVICE CHECK
Battery Remaining Longevity: 130 mo
Battery Remaining Percentage: 95.5 %
Battery Voltage: 3.02 V
Brady Statistic AP VP Percent: 25 %
Brady Statistic AP VS Percent: 3.2 %
Brady Statistic AS VP Percent: 62 %
Brady Statistic AS VS Percent: 9.7 %
Brady Statistic RA Percent Paced: 28 %
Brady Statistic RV Percent Paced: 87 %
Date Time Interrogation Session: 20241126020023
Implantable Lead Connection Status: 753985
Implantable Lead Connection Status: 753985
Implantable Lead Implant Date: 20240826
Implantable Lead Implant Date: 20240826
Implantable Lead Location: 753859
Implantable Lead Location: 753860
Implantable Pulse Generator Implant Date: 20240826
Lead Channel Impedance Value: 550 Ohm
Lead Channel Impedance Value: 550 Ohm
Lead Channel Pacing Threshold Amplitude: 0.5 V
Lead Channel Pacing Threshold Amplitude: 0.5 V
Lead Channel Pacing Threshold Pulse Width: 0.5 ms
Lead Channel Pacing Threshold Pulse Width: 0.5 ms
Lead Channel Sensing Intrinsic Amplitude: 12 mV
Lead Channel Sensing Intrinsic Amplitude: 3.1 mV
Lead Channel Setting Pacing Amplitude: 0.75 V
Lead Channel Setting Pacing Amplitude: 1.5 V
Lead Channel Setting Pacing Pulse Width: 0.5 ms
Lead Channel Setting Sensing Sensitivity: 2 mV
Pulse Gen Model: 2272
Pulse Gen Serial Number: 8206075

## 2023-03-11 NOTE — Progress Notes (Signed)
Remote pacemaker transmission.   

## 2023-03-16 ENCOUNTER — Encounter: Payer: Self-pay | Admitting: Cardiology

## 2023-03-16 ENCOUNTER — Ambulatory Visit: Payer: Medicare HMO | Attending: Cardiology | Admitting: Cardiology

## 2023-03-16 VITALS — BP 136/74 | HR 81 | Ht 68.0 in | Wt 165.8 lb

## 2023-03-16 DIAGNOSIS — I442 Atrioventricular block, complete: Secondary | ICD-10-CM | POA: Diagnosis not present

## 2023-03-16 DIAGNOSIS — Z950 Presence of cardiac pacemaker: Secondary | ICD-10-CM | POA: Diagnosis not present

## 2023-03-16 LAB — CUP PACEART INCLINIC DEVICE CHECK
Battery Remaining Longevity: 130 mo
Battery Voltage: 3.02 V
Brady Statistic RA Percent Paced: 26 %
Brady Statistic RV Percent Paced: 62 %
Date Time Interrogation Session: 20241231172344
Implantable Lead Connection Status: 753985
Implantable Lead Connection Status: 753985
Implantable Lead Implant Date: 20240826
Implantable Lead Implant Date: 20240826
Implantable Lead Location: 753859
Implantable Lead Location: 753860
Implantable Pulse Generator Implant Date: 20240826
Lead Channel Impedance Value: 550 Ohm
Lead Channel Impedance Value: 562.5 Ohm
Lead Channel Pacing Threshold Amplitude: 0.5 V
Lead Channel Pacing Threshold Amplitude: 0.5 V
Lead Channel Pacing Threshold Amplitude: 0.75 V
Lead Channel Pacing Threshold Amplitude: 0.75 V
Lead Channel Pacing Threshold Pulse Width: 0.5 ms
Lead Channel Pacing Threshold Pulse Width: 0.5 ms
Lead Channel Pacing Threshold Pulse Width: 0.5 ms
Lead Channel Pacing Threshold Pulse Width: 0.5 ms
Lead Channel Sensing Intrinsic Amplitude: 1.8 mV
Lead Channel Sensing Intrinsic Amplitude: 12 mV
Lead Channel Setting Pacing Amplitude: 0.75 V
Lead Channel Setting Pacing Amplitude: 1.5 V
Lead Channel Setting Pacing Pulse Width: 0.5 ms
Lead Channel Setting Sensing Sensitivity: 2 mV
Pulse Gen Model: 2272
Pulse Gen Serial Number: 8206075

## 2023-03-16 NOTE — Patient Instructions (Signed)
 Medication Instructions:  Your physician recommends that you continue on your current medications as directed. Please refer to the Current Medication list given to you today.  *If you need a refill on your cardiac medications before your next appointment, please call your pharmacy*  Follow-Up: At [facility], you and your health needs are our priority.  As part of our continuing mission to provide you with exceptional heart care, we have created designated Provider Care Teams.  These Care Teams include your primary Cardiologist (physician) and Advanced Practice Providers (APPs -  Physician Assistants and Nurse Practitioners) who all work together to provide you with the care you need, when you need it.  Your next appointment:   1 year  Provider:   You will see one of the following Advanced Practice Providers on your designated Care Team:   Charlies Arthur, PA-C Ozell Jodie Passey, PA-C Suzann Riddle, NP

## 2023-03-16 NOTE — Progress Notes (Signed)
  Electrophysiology Office Follow up Visit Note:    Date:  03/16/2023   ID:  Martin [name], DOB August 21, 1945, MRN [id number]  PCP:  Pura Lenis, MD  [facility] Cardiologist:  None  [facility] Electrophysiologist:  OLE ONEIDA HOLTS, MD    Interval History:     Martin [name] is a 77 y.o. male who presents for a follow up visit.   The patient had a pacemaker implanted November 09, 2022.  Pacemaker was implanted for symptomatic bradycardia.  His system is a left bundle, dual-chamber permanent pacemaker.        Past medical, surgical, social and family history were reviewed.  ROS:   Please see the history of present illness.    All other systems reviewed and are negative.  EKGs/Labs/Other Studies Reviewed:    The following studies were reviewed today:  March 16, 2023 in clinic device interrogation personally reviewed Battery longevity 10.7 years Presenting rhythm a sensed, V sensed at 86 Lead parameters stable A pacing 26% V pacing 62% Increased PV AB 250 from 130 to reduce far field oversensing.   EKG Interpretation Date/Time:  Tuesday March 16 2023 15:38:04 EST Ventricular Rate:  81 PR Interval:  132 QRS Duration:  136 QT Interval:  396 QTC Calculation: 460 R Axis:   99  Text Interpretation: Normal sinus rhythm Right bundle branch block Confirmed by Holts Ole [phone]) on 03/16/2023 3:59:08 PM    Physical Exam:    VS:  BP 136/74 (BP Location: Left Arm, Patient Position: Sitting, Cuff Size: Normal)   Pulse 81   Ht 5' 8 (1.727 m)   Wt 165 lb 12.8 oz (75.2 kg)   SpO2 98%   BMI 25.21 kg/m     Wt Readings from Last 3 Encounters:  03/16/23 165 lb 12.8 oz (75.2 kg)  12/05/22 160 lb 15 oz (73 kg)  12/03/22 160 lb 15 oz (73 kg)     GEN: no distress CARD: RRR, No MRG.  Pacemaker pocket well-healed RESP: No IWOB. CTAB.      ASSESSMENT:    1. CHB (complete heart block) (HCC)   2. Cardiac pacemaker in situ    PLAN:    In order  of problems listed above:  #Complete heart block #Permanent pacemaker in situ Device functioning appropriately.  Continue remote monitoring.   Follow-up 1 year with APP.   Signed, Ole Holts, MD, [facility] 03/16/2023 4:16 PM    Electrophysiology [facility]

## 2023-05-11 ENCOUNTER — Ambulatory Visit (INDEPENDENT_AMBULATORY_CARE_PROVIDER_SITE_OTHER): Payer: Medicare HMO

## 2023-05-11 DIAGNOSIS — I442 Atrioventricular block, complete: Secondary | ICD-10-CM

## 2023-05-12 LAB — CUP PACEART REMOTE DEVICE CHECK
Battery Remaining Longevity: 131 mo
Battery Remaining Percentage: 95.5 %
Battery Voltage: 3.01 V
Brady Statistic AP VP Percent: 9.6 %
Brady Statistic AP VS Percent: 17 %
Brady Statistic AS VP Percent: 2.4 %
Brady Statistic AS VS Percent: 71 %
Brady Statistic RA Percent Paced: 26 %
Brady Statistic RV Percent Paced: 12 %
Date Time Interrogation Session: 20250225020013
Implantable Lead Connection Status: 753985
Implantable Lead Connection Status: 753985
Implantable Lead Implant Date: 20240826
Implantable Lead Implant Date: 20240826
Implantable Lead Location: 753859
Implantable Lead Location: 753860
Implantable Pulse Generator Implant Date: 20240826
Lead Channel Impedance Value: 540 Ohm
Lead Channel Impedance Value: 580 Ohm
Lead Channel Pacing Threshold Amplitude: 0.5 V
Lead Channel Pacing Threshold Amplitude: 0.5 V
Lead Channel Pacing Threshold Pulse Width: 0.5 ms
Lead Channel Pacing Threshold Pulse Width: 0.5 ms
Lead Channel Sensing Intrinsic Amplitude: 1.1 mV
Lead Channel Sensing Intrinsic Amplitude: 12 mV
Lead Channel Setting Pacing Amplitude: 0.75 V
Lead Channel Setting Pacing Amplitude: 1.5 V
Lead Channel Setting Pacing Pulse Width: 0.5 ms
Lead Channel Setting Sensing Sensitivity: 2 mV
Pulse Gen Model: 2272
Pulse Gen Serial Number: 8206075

## 2023-05-18 ENCOUNTER — Encounter: Payer: Self-pay | Admitting: Cardiology

## 2023-06-16 NOTE — Progress Notes (Signed)
 Remote pacemaker transmission.

## 2023-06-16 NOTE — Addendum Note (Signed)
 Addended by: Elease Etienne A on: 06/16/2023 11:20 AM   Modules accepted: Orders

## 2023-08-10 ENCOUNTER — Ambulatory Visit (INDEPENDENT_AMBULATORY_CARE_PROVIDER_SITE_OTHER): Payer: Medicare HMO

## 2023-08-10 DIAGNOSIS — I442 Atrioventricular block, complete: Secondary | ICD-10-CM

## 2023-08-11 LAB — CUP PACEART REMOTE DEVICE CHECK
Battery Remaining Longevity: 126 mo
Battery Remaining Percentage: 95.5 %
Battery Voltage: 2.99 V
Brady Statistic AP VP Percent: 11 %
Brady Statistic AP VS Percent: 13 %
Brady Statistic AS VP Percent: 23 %
Brady Statistic AS VS Percent: 53 %
Brady Statistic RA Percent Paced: 23 %
Brady Statistic RV Percent Paced: 34 %
Date Time Interrogation Session: 20250527020015
Implantable Lead Connection Status: 753985
Implantable Lead Connection Status: 753985
Implantable Lead Implant Date: 20240826
Implantable Lead Implant Date: 20240826
Implantable Lead Location: 753859
Implantable Lead Location: 753860
Implantable Pulse Generator Implant Date: 20240826
Lead Channel Impedance Value: 540 Ohm
Lead Channel Impedance Value: 550 Ohm
Lead Channel Pacing Threshold Amplitude: 0.5 V
Lead Channel Pacing Threshold Amplitude: 0.5 V
Lead Channel Pacing Threshold Pulse Width: 0.5 ms
Lead Channel Pacing Threshold Pulse Width: 0.5 ms
Lead Channel Sensing Intrinsic Amplitude: 12 mV
Lead Channel Sensing Intrinsic Amplitude: 3.4 mV
Lead Channel Setting Pacing Amplitude: 0.75 V
Lead Channel Setting Pacing Amplitude: 1.5 V
Lead Channel Setting Pacing Pulse Width: 0.5 ms
Lead Channel Setting Sensing Sensitivity: 2 mV
Pulse Gen Model: 2272
Pulse Gen Serial Number: 8206075

## 2023-08-16 ENCOUNTER — Ambulatory Visit: Payer: Self-pay | Admitting: Cardiology

## 2023-09-27 NOTE — Addendum Note (Signed)
 Addended by: TAWNI DRILLING D on: 09/27/2023 10:27 AM   Modules accepted: Orders

## 2023-09-27 NOTE — Progress Notes (Signed)
 Remote pacemaker transmission.

## 2023-11-09 ENCOUNTER — Ambulatory Visit (INDEPENDENT_AMBULATORY_CARE_PROVIDER_SITE_OTHER): Payer: Medicare HMO

## 2023-11-09 DIAGNOSIS — I442 Atrioventricular block, complete: Secondary | ICD-10-CM

## 2023-11-10 ENCOUNTER — Ambulatory Visit: Payer: Self-pay | Admitting: Cardiology

## 2023-11-10 LAB — CUP PACEART REMOTE DEVICE CHECK
Battery Remaining Longevity: 120 mo
Battery Remaining Percentage: 94 %
Battery Voltage: 2.99 V
Brady Statistic AP VP Percent: 18 %
Brady Statistic AP VS Percent: 7.8 %
Brady Statistic AS VP Percent: 41 %
Brady Statistic AS VS Percent: 33 %
Brady Statistic RA Percent Paced: 25 %
Brady Statistic RV Percent Paced: 59 %
Date Time Interrogation Session: 20250826020023
Implantable Lead Connection Status: 753985
Implantable Lead Connection Status: 753985
Implantable Lead Implant Date: 20240826
Implantable Lead Implant Date: 20240826
Implantable Lead Location: 753859
Implantable Lead Location: 753860
Implantable Pulse Generator Implant Date: 20240826
Lead Channel Impedance Value: 490 Ohm
Lead Channel Impedance Value: 490 Ohm
Lead Channel Pacing Threshold Amplitude: 0.375 V
Lead Channel Pacing Threshold Amplitude: 0.5 V
Lead Channel Pacing Threshold Pulse Width: 0.5 ms
Lead Channel Pacing Threshold Pulse Width: 0.5 ms
Lead Channel Sensing Intrinsic Amplitude: 12 mV
Lead Channel Sensing Intrinsic Amplitude: 2.9 mV
Lead Channel Setting Pacing Amplitude: 0.75 V
Lead Channel Setting Pacing Amplitude: 1.375
Lead Channel Setting Pacing Pulse Width: 0.5 ms
Lead Channel Setting Sensing Sensitivity: 2 mV
Pulse Gen Model: 2272
Pulse Gen Serial Number: 8206075

## 2023-11-29 NOTE — Progress Notes (Signed)
 Remote PPM Transmission

## 2023-12-10 LAB — COLOGUARD: COLOGUARD: NEGATIVE

## 2024-01-06 ENCOUNTER — Other Ambulatory Visit: Payer: Self-pay

## 2024-01-06 ENCOUNTER — Emergency Department (HOSPITAL_COMMUNITY)

## 2024-01-06 ENCOUNTER — Emergency Department (HOSPITAL_COMMUNITY)
Admission: EM | Admit: 2024-01-06 | Discharge: 2024-01-07 | Disposition: A | Discharge status: Home / Self Care | Attending: Emergency Medicine | Admitting: Emergency Medicine

## 2024-01-06 DIAGNOSIS — S0990XA Unspecified injury of head, initial encounter: Secondary | ICD-10-CM | POA: Diagnosis present

## 2024-01-06 DIAGNOSIS — M25511 Pain in right shoulder: Secondary | ICD-10-CM | POA: Insufficient documentation

## 2024-01-06 DIAGNOSIS — W19XXXA Unspecified fall, initial encounter: Secondary | ICD-10-CM | POA: Insufficient documentation

## 2024-01-06 DIAGNOSIS — R531 Weakness: Secondary | ICD-10-CM | POA: Insufficient documentation

## 2024-01-06 LAB — CBC WITH DIFFERENTIAL/PLATELET
Abs Immature Granulocytes: 0.02 K/uL (ref 0.00–0.07)
Basophils Absolute: 0.1 K/uL (ref 0.0–0.1)
Basophils Relative: 1 %
Eosinophils Absolute: 0.3 K/uL (ref 0.0–0.5)
Eosinophils Relative: 3 %
HCT: 43.2 % (ref 39.0–52.0)
Hemoglobin: 14.7 g/dL (ref 13.0–17.0)
Immature Granulocytes: 0 %
Lymphocytes Relative: 21 %
Lymphs Abs: 1.9 K/uL (ref 0.7–4.0)
MCH: 30.8 pg (ref 26.0–34.0)
MCHC: 34 g/dL (ref 30.0–36.0)
MCV: 90.6 fL (ref 80.0–100.0)
Monocytes Absolute: 0.9 K/uL (ref 0.1–1.0)
Monocytes Relative: 10 %
Neutro Abs: 5.8 K/uL (ref 1.7–7.7)
Neutrophils Relative %: 65 %
Platelets: 227 K/uL (ref 150–400)
RBC: 4.77 MIL/uL (ref 4.22–5.81)
RDW: 12.7 % (ref 11.5–15.5)
WBC: 9 K/uL (ref 4.0–10.5)
nRBC: 0 % (ref 0.0–0.2)

## 2024-01-06 LAB — URINALYSIS, ROUTINE W REFLEX MICROSCOPIC
Bilirubin Urine: NEGATIVE
Glucose, UA: NEGATIVE mg/dL
Hgb urine dipstick: NEGATIVE
Ketones, ur: NEGATIVE mg/dL
Nitrite: NEGATIVE
Protein, ur: NEGATIVE mg/dL
Specific Gravity, Urine: 1.005 (ref 1.005–1.030)
pH: 6 (ref 5.0–8.0)

## 2024-01-06 LAB — TROPONIN I (HIGH SENSITIVITY)
Troponin I (High Sensitivity): 9 ng/L (ref <18)
Troponin I (High Sensitivity): 9 ng/L (ref <18)

## 2024-01-06 LAB — COMPREHENSIVE METABOLIC PANEL WITH GFR
ALT: 18 U/L (ref 0–44)
AST: 21 U/L (ref 15–41)
Albumin: 3.9 g/dL (ref 3.5–5.0)
Alkaline Phosphatase: 46 U/L (ref 38–126)
Anion gap: 15 (ref 5–15)
BUN: 22 mg/dL (ref 8–23)
CO2: 26 mmol/L (ref 22–32)
Calcium: 9.6 mg/dL (ref 8.9–10.3)
Chloride: 96 mmol/L — ABNORMAL LOW (ref 98–111)
Creatinine, Ser: 1.15 mg/dL (ref 0.61–1.24)
GFR, Estimated: >60 mL/min (ref >60)
Glucose, Bld: 131 mg/dL — ABNORMAL HIGH (ref 70–99)
Potassium: 4.4 mmol/L (ref 3.5–5.1)
Sodium: 137 mmol/L (ref 135–145)
Total Bilirubin: 0.6 mg/dL (ref 0.0–1.2)
Total Protein: 7.6 g/dL (ref 6.5–8.1)

## 2024-01-06 NOTE — ED Notes (Signed)
C collar placed on pt in triage.

## 2024-01-06 NOTE — ED Provider Triage Note (Signed)
 Emergency Medicine Provider Triage Evaluation Note  Martin [name] , a 78 y.o. male  was evaluated in triage.  Pt complains of right shoulder pain with numbness in the index and middle fingers. Patient reports that the fall was two days ago. He reports that he was was trying to get out of his recliner 2 days ago when he felt his legs could not hold them and fell.  He is not sure if he hit his head however reports that he reports a fall.  Reports he has some chronic left-sided head and neck pain that he sees a doctor about already.  Reports some pain in his right shoulder however is nontender to palpation.  Reports he feels numbness in his index and middle finger.  Review of Systems  Positive:  Negative:   Physical Exam  BP (!) 167/92 (BP Location: Right Arm)   Pulse 78   Temp 98.2 F (36.8 C)   Resp 20   SpO2 100%  Gen:   Awake, no distress   Resp:  Normal effort  MSK:   Moves extremities without difficulty  Other:  Finger thumb opposition intact in all 5 fingers.  Normal radial pulses.  Compartments are soft.  Coloration, temperature, and size appear and feel symmetric bilaterally.  No tenderness to palpation on the right shoulder however patient does have pain with and without resistance to the shoulder.  Do not appreciate any significant midline tenderness but he does have some more left-sided into the right sided paraspinal tenderness.  I placed patient in a c-collar.  Medical Decision Making  Medically screening exam initiated at 7:34 PM.  Appropriate orders placed.  Martin [name] was informed that the remainder of the evaluation will be completed by another provider, this initial triage assessment does not replace that evaluation, and the importance of remaining in the ED until their evaluation is complete.  Labs and imaging ordered   Martin [name], Martin [name] 01/06/24 8046

## 2024-01-06 NOTE — ED Triage Notes (Signed)
 Patient fell 2 nights ago and is having right shoulder pain. He has full range of motion but states that it is painful.

## 2024-01-07 NOTE — ED Notes (Signed)
 Called [employer] cab service to arrange a ride home for the patient. Ride available in 10-15 minutes.

## 2024-01-07 NOTE — ED Provider Notes (Signed)
 [facility] EMERG[facility] DEPARTMENT AT [facility] Provider Note   CSN: [id number] Arrival date & time: 01/06/24  8166     Patient presents with: Martin [name] is a 78 y.o. male.   78 year old male who presents ER today with shoulder pain after fall.  Patient states he fell couple days ago and standing up with his walker.  Clemens on his right shoulder hurting it.  About a weeks get better but it persisted so presented here for further evaluation.  Patient has generalized weakness intermittently but he is at his baseline reportedly.  No recent illnesses.  No other traumatic injuries.   Fall       Prior to Admission medications   Medication Sig Start Date End Date Taking? Authorizing Provider  hydrochlorothiazide  (HYDRODIURIL ) 25 MG tablet Take 25 mg by mouth daily.    [provider]  latanoprost  (XALATAN ) 0.005 % ophthalmic solution Place 1 drop into both eyes at bedtime.    [provider]  losartan  (COZAAR ) 25 MG tablet Take 1 tablet (25 mg total) by mouth daily. 11/13/22   Lesia Ozell Barter, PA-C  metFORMIN  (GLUCOPHAGE ) 500 MG tablet Take 1 tablet (500 mg total) by mouth daily with breakfast. 03/05/15   Levy Nat GAILS, PA-C  metoprolol  succinate (TOPROL  XL) 25 MG 24 hr tablet Take 0.5 tablets (12.5 mg total) by mouth at bedtime. 11/13/22   Lesia Ozell Barter, PA-C  sertraline  (ZOLOFT ) 100 MG tablet Take 100 mg by mouth daily. 06/01/21   [provider]  sertraline  (ZOLOFT ) 50 MG tablet Take 50 mg by mouth every evening. 03/31/17   [provider]  simvastatin  (ZOCOR ) 40 MG tablet Take 1 tablet (40 mg total) by mouth at bedtime. 08/14/15   Claudene Rayfield HERO, MD  Vitamin D, Ergocalciferol, (DRISDOL) 1.25 MG (50000 UNIT) CAPS capsule Take 50,000 Units by mouth once a week. 02/05/23   [provider]    Allergies: Enalapril maleate    Review of Systems  Updated Vital Signs BP (!) 157/91   Pulse 62   Temp 98.2 F  (36.8 C)   Resp 16   SpO2 98%   Physical Exam Vitals and nursing note reviewed.  Constitutional:      Appearance: He is well-developed.  HENT:     Head: Normocephalic and atraumatic.  Cardiovascular:     Rate and Rhythm: Normal rate.  Pulmonary:     Effort: Pulmonary effort is normal. No respiratory distress.  Abdominal:     General: There is no distension.  Musculoskeletal:        General: Tenderness (Mostly over the scapula.) present. Normal range of motion.     Cervical back: Normal range of motion.  Neurological:     Mental Status: He is alert. Mental status is at baseline.     Comments: No altered mental status, able to give full seemingly accurate history.  Face is symmetric, EOM's intact, pupils equal and reactive, vision intact, tongue and uvula midline without deviation. Upper and Lower extremity motor 5/5, intact pain perception in distal extremities, 2+ reflexes in biceps, patella and achilles tendons. Able to perform finger to nose normal with both hands. Walks without assistance or evident ataxia.       (all labs ordered are listed, but only abnormal results are displayed) Labs Reviewed  COMPREHENSIVE METABOLIC PANEL WITH GFR - Abnormal; Notable for the following components:      Result Value   Chloride 96 (*)  Glucose, Bld 131 (*)    All other components within normal limits  URINALYSIS, ROUTINE W REFLEX MICROSCOPIC - Abnormal; Notable for the following components:   Color, Urine STRAW (*)    Leukocytes,Ua SMALL (*)    Bacteria, UA RARE (*)    All other components within normal limits  CBC WITH DIFFERENTIAL/PLATELET  TROPONIN I (HIGH SENSITIVITY)  TROPONIN I (HIGH SENSITIVITY)    EKG: None  Radiology: DG Shoulder Right Result Date: 01/06/2024 CLINICAL DATA:  Fall shoulder pain EXAM: RIGHT SHOULDER - 2+ VIEW COMPARISON:  None Available. FINDINGS: No definitive fracture. Slight inferior positioning of the humeral head with respect to the glenoid, but  no anterior or posterior displacement on Y-view. Mild AC joint and glenohumeral degenerative change. IMPRESSION: No definitive fracture. Slight inferior positioning of the humeral head with respect to the glenoid, but no anterior or posterior displacement on Y view (?effusion). There are degenerative changes Electronically Signed   By: Luke Bun M.D.   On: 01/06/2024 21:38   CT Head Wo Contrast Result Date: 01/06/2024 CLINICAL DATA:  Head and neck trauma EXAM: CT HEAD WITHOUT CONTRAST CT CERVICAL SPINE WITHOUT CONTRAST TECHNIQUE: Multidetector CT imaging of the head and cervical spine was performed following the standard protocol without intravenous contrast. Multiplanar CT image reconstructions of the cervical spine were also generated. RADIATION DOSE REDUCTION: This exam was performed according to the departmental dose-optimization program which includes automated exposure control, adjustment of the mA and/or kV according to patient size and/or use of iterative reconstruction technique. COMPARISON:  CT brain 09/29/2021, CT brain and cervical spine 03/07/2021, MRI 09/30/2021 FINDINGS: CT HEAD FINDINGS Brain: Negative for acute intracranial hemorrhage or mass. Atrophy and moderate chronic small vessel ischemic changes of the white matter. Interval age indeterminate small infarct involving left basal ganglia and white matter, series 6 image 19-22. Small age indeterminate lacunar infarct in the left thalamus, series 6, image 20. There may be some ex vacuo enlargement of left frontal horn suggesting chronic process but new compared with MRI from July 2023. The ventricles are nonenlarged. Vascular: No hyperdense vessels.  Carotid vascular calcification Skull: Normal. Negative for fracture or focal lesion. Sinuses/Orbits: Mucosal thickening in the sinuses Other: None CT CERVICAL SPINE FINDINGS Alignment: Straightening of the cervical spine. Trace anterolisthesis C3 on C4. Facet alignment is maintained. Skull base  and vertebrae: No acute fracture. No primary bone lesion or focal pathologic process. Soft tissues and spinal canal: No prevertebral fluid or swelling. No visible canal hematoma. Disc levels: Moderate advanced disc space narrowing C4 through C7. Multilevel bilateral foraminal narrowing. Posterior disc osteophytes C4-C5, C5-C6 and C6-C7, ossified posterior longitudinal ligament at C5-C6 and complex at C5-C6 results in at least moderate canal stenosis and there is mass effect on the anterior thecal sac. Upper chest: Negative. Other: None IMPRESSION: 1. Negative for acute intracranial hemorrhage or mass. 2. Atrophy and chronic small vessel ischemic changes of the white matter. Interval age indeterminate small infarct involving left basal ganglia and white matter, suspect that it is chronic, but represents a new finding compared to the MRI from 2023. Small age indeterminate lacunar infarct in the left thalamus. 3. Straightening of the cervical spine with trace anterolisthesis C3 on C4, likely degenerative. No acute osseous abnormality. 4. Multilevel degenerative changes of the cervical spine with at least moderate canal stenosis at C5-C6. Electronically Signed   By: Luke Bun M.D.   On: 01/06/2024 21:26   CT Cervical Spine Wo Contrast Result Date: 01/06/2024 CLINICAL DATA:  Head and neck trauma EXAM: CT HEAD WITHOUT CONTRAST CT CERVICAL SPINE WITHOUT CONTRAST TECHNIQUE: Multidetector CT imaging of the head and cervical spine was performed following the standard protocol without intravenous contrast. Multiplanar CT image reconstructions of the cervical spine were also generated. RADIATION DOSE REDUCTION: This exam was performed according to the departmental dose-optimization program which includes automated exposure control, adjustment of the mA and/or kV according to patient size and/or use of iterative reconstruction technique. COMPARISON:  CT brain 09/29/2021, CT brain and cervical spine 03/07/2021, MRI  09/30/2021 FINDINGS: CT HEAD FINDINGS Brain: Negative for acute intracranial hemorrhage or mass. Atrophy and moderate chronic small vessel ischemic changes of the white matter. Interval age indeterminate small infarct involving left basal ganglia and white matter, series 6 image 19-22. Small age indeterminate lacunar infarct in the left thalamus, series 6, image 20. There may be some ex vacuo enlargement of left frontal horn suggesting chronic process but new compared with MRI from July 2023. The ventricles are nonenlarged. Vascular: No hyperdense vessels.  Carotid vascular calcification Skull: Normal. Negative for fracture or focal lesion. Sinuses/Orbits: Mucosal thickening in the sinuses Other: None CT CERVICAL SPINE FINDINGS Alignment: Straightening of the cervical spine. Trace anterolisthesis C3 on C4. Facet alignment is maintained. Skull base and vertebrae: No acute fracture. No primary bone lesion or focal pathologic process. Soft tissues and spinal canal: No prevertebral fluid or swelling. No visible canal hematoma. Disc levels: Moderate advanced disc space narrowing C4 through C7. Multilevel bilateral foraminal narrowing. Posterior disc osteophytes C4-C5, C5-C6 and C6-C7, ossified posterior longitudinal ligament at C5-C6 and complex at C5-C6 results in at least moderate canal stenosis and there is mass effect on the anterior thecal sac. Upper chest: Negative. Other: None IMPRESSION: 1. Negative for acute intracranial hemorrhage or mass. 2. Atrophy and chronic small vessel ischemic changes of the white matter. Interval age indeterminate small infarct involving left basal ganglia and white matter, suspect that it is chronic, but represents a new finding compared to the MRI from 2023. Small age indeterminate lacunar infarct in the left thalamus. 3. Straightening of the cervical spine with trace anterolisthesis C3 on C4, likely degenerative. No acute osseous abnormality. 4. Multilevel degenerative changes of the  cervical spine with at least moderate canal stenosis at C5-C6. Electronically Signed   By: Luke Bun M.D.   On: 01/06/2024 21:26     Procedures   Medications Ordered in the ED - No data to display                                  Medical Decision Making  CT did show a small area of infarct age-indeterminate.  Discussed with radiology and neurology both who thought that most likely chronically could not be certain.  He does not have any neurologic symptoms that correlate with his area or really any deficits at all that would make us  concerned for CVA so we will refer to his neurologist for further workup and management.  Return here for any new or worsening symptoms.  No obvious shoulder fracture, dislocation or other emergent etiology.   Final diagnoses:  Fall, initial encounter    ED Discharge Orders     None          Vincenta Steffey, Selinda, MD 01/07/24 [phone]

## 2024-01-07 NOTE — ED Notes (Signed)
 Pt ambulated in the hallway about 100 feet. Was able to walk with a strong gait and minimal assistance.

## 2024-01-21 DIAGNOSIS — M6281 Muscle weakness (generalized): Secondary | ICD-10-CM | POA: Diagnosis not present

## 2024-02-04 DIAGNOSIS — I10 Essential (primary) hypertension: Secondary | ICD-10-CM | POA: Diagnosis not present

## 2024-02-04 DIAGNOSIS — H401190 Primary open-angle glaucoma, unspecified eye, stage unspecified: Secondary | ICD-10-CM | POA: Diagnosis not present

## 2024-02-04 DIAGNOSIS — F321 Major depressive disorder, single episode, moderate: Secondary | ICD-10-CM | POA: Diagnosis not present

## 2024-02-04 DIAGNOSIS — N401 Enlarged prostate with lower urinary tract symptoms: Secondary | ICD-10-CM | POA: Diagnosis not present

## 2024-02-04 DIAGNOSIS — E782 Mixed hyperlipidemia: Secondary | ICD-10-CM | POA: Diagnosis not present

## 2024-02-04 DIAGNOSIS — E1159 Type 2 diabetes mellitus with other circulatory complications: Secondary | ICD-10-CM | POA: Diagnosis not present

## 2024-02-04 DIAGNOSIS — F411 Generalized anxiety disorder: Secondary | ICD-10-CM | POA: Diagnosis not present

## 2024-02-08 ENCOUNTER — Ambulatory Visit (INDEPENDENT_AMBULATORY_CARE_PROVIDER_SITE_OTHER): Payer: Medicare HMO

## 2024-02-08 DIAGNOSIS — I442 Atrioventricular block, complete: Secondary | ICD-10-CM

## 2024-02-08 LAB — CUP PACEART REMOTE DEVICE CHECK
Battery Remaining Longevity: 116 mo
Battery Remaining Percentage: 92 %
Battery Voltage: 2.99 V
Brady Statistic AP VP Percent: 19 %
Brady Statistic AP VS Percent: 5.6 %
Brady Statistic AS VP Percent: 51 %
Brady Statistic AS VS Percent: 24 %
Brady Statistic RA Percent Paced: 24 %
Brady Statistic RV Percent Paced: 70 %
Date Time Interrogation Session: 20251125020015
Implantable Lead Connection Status: 753985
Implantable Lead Connection Status: 753985
Implantable Lead Implant Date: 20240826
Implantable Lead Implant Date: 20240826
Implantable Lead Location: 753859
Implantable Lead Location: 753860
Implantable Pulse Generator Implant Date: 20240826
Lead Channel Impedance Value: 490 Ohm
Lead Channel Impedance Value: 550 Ohm
Lead Channel Pacing Threshold Amplitude: 0.375 V
Lead Channel Pacing Threshold Amplitude: 0.5 V
Lead Channel Pacing Threshold Pulse Width: 0.5 ms
Lead Channel Pacing Threshold Pulse Width: 0.5 ms
Lead Channel Sensing Intrinsic Amplitude: 12 mV
Lead Channel Sensing Intrinsic Amplitude: 4.6 mV
Lead Channel Setting Pacing Amplitude: 0.75 V
Lead Channel Setting Pacing Amplitude: 1.375
Lead Channel Setting Pacing Pulse Width: 0.5 ms
Lead Channel Setting Sensing Sensitivity: 2 mV
Pulse Gen Model: 2272
Pulse Gen Serial Number: 8206075

## 2024-02-09 ENCOUNTER — Ambulatory Visit: Payer: Self-pay | Admitting: Cardiology

## 2024-02-09 NOTE — Progress Notes (Signed)
 Remote PPM Transmission

## 2024-03-20 ENCOUNTER — Other Ambulatory Visit: Payer: Self-pay

## 2024-03-20 ENCOUNTER — Encounter (HOSPITAL_COMMUNITY): Payer: Self-pay

## 2024-03-20 ENCOUNTER — Emergency Department (HOSPITAL_COMMUNITY)

## 2024-03-20 ENCOUNTER — Emergency Department (HOSPITAL_COMMUNITY)
Admission: EM | Admit: 2024-03-20 | Discharge: 2024-03-21 | Discharge status: Against Medical Advice | Attending: Emergency Medicine | Admitting: Emergency Medicine

## 2024-03-20 DIAGNOSIS — I10 Essential (primary) hypertension: Secondary | ICD-10-CM | POA: Insufficient documentation

## 2024-03-20 DIAGNOSIS — R059 Cough, unspecified: Secondary | ICD-10-CM | POA: Diagnosis not present

## 2024-03-20 DIAGNOSIS — Z5321 Procedure and treatment not carried out due to patient leaving prior to being seen by health care provider: Secondary | ICD-10-CM | POA: Diagnosis not present

## 2024-03-20 DIAGNOSIS — Z950 Presence of cardiac pacemaker: Secondary | ICD-10-CM | POA: Diagnosis not present

## 2024-03-20 DIAGNOSIS — E119 Type 2 diabetes mellitus without complications: Secondary | ICD-10-CM | POA: Diagnosis not present

## 2024-03-20 DIAGNOSIS — R0602 Shortness of breath: Secondary | ICD-10-CM | POA: Insufficient documentation

## 2024-03-20 LAB — CBG MONITORING, ED: Glucose-Capillary: 261 mg/dL — ABNORMAL HIGH (ref 70–99)

## 2024-03-20 LAB — CBC
HCT: 45.7 % (ref 39.0–52.0)
Hemoglobin: 15.8 g/dL (ref 13.0–17.0)
MCH: 31.2 pg (ref 26.0–34.0)
MCHC: 34.6 g/dL (ref 30.0–36.0)
MCV: 90.1 fL (ref 80.0–100.0)
Platelets: 256 K/uL (ref 150–400)
RBC: 5.07 MIL/uL (ref 4.22–5.81)
RDW: 13.3 % (ref 11.5–15.5)
WBC: 9.8 K/uL (ref 4.0–10.5)
nRBC: 0 % (ref 0.0–0.2)

## 2024-03-20 LAB — RESP PANEL BY RT-PCR (RSV, FLU A&B, COVID)  RVPGX2
Influenza A by PCR: NEGATIVE
Influenza B by PCR: NEGATIVE
Resp Syncytial Virus by PCR: NEGATIVE
SARS Coronavirus 2 by RT PCR: NEGATIVE

## 2024-03-20 LAB — BASIC METABOLIC PANEL WITH GFR
Anion gap: 15 (ref 5–15)
BUN: 25 mg/dL — ABNORMAL HIGH (ref 8–23)
CO2: 24 mmol/L (ref 22–32)
Calcium: 9.8 mg/dL (ref 8.9–10.3)
Chloride: 96 mmol/L — ABNORMAL LOW (ref 98–111)
Creatinine, Ser: 1.19 mg/dL (ref 0.61–1.24)
GFR, Estimated: >60 mL/min
Glucose, Bld: 257 mg/dL — ABNORMAL HIGH (ref 70–99)
Potassium: 4.2 mmol/L (ref 3.5–5.1)
Sodium: 136 mmol/L (ref 135–145)

## 2024-03-20 NOTE — ED Provider Triage Note (Signed)
 Emergency Medicine Provider Triage Evaluation Note  Martin [name] , a 79 y.o. male  was evaluated in triage.  Pt complains of BIB [facility] with c/o SOB. Reports sudden onset of SOB, reports SOB worse with exertion. Also reports non production cough x 3 days. Was trying to make his bed and all of a sudden collapsed on the edge of the bed. No chest pain. No longer short of breath. No leg swelling. Has had some rhinorrhea. No cough. No h/o DVT/PE. Has h/o CHB s/p pacemaker, HTN, HLD, DM, GERD. He states this hasn't happened before like this. Didn't pass out. Reports his mouth is dry.   148/76  HR 104 97% RA  CBG 286 mg/dL  Review of Systems  Positive: SOB, collapse, rhinorrhea Negative: Syncope, CP, leg swelling, N/V, abdominal pain  Physical Exam  BP 138/81 (BP Location: Left Arm)   Pulse 99   Temp 97.8 F (36.6 C) (Oral)   Resp 20   Ht 5' 8 (1.727 m)   Wt 77.6 kg   SpO2 98%   BMI 26.00 kg/m  Gen:   Awake, no distress   Resp:  Normal effort  MSK:   Moves extremities without difficulty  Other:    Medical Decision Making  Medically screening exam initiated at 9:52 PM.  Appropriate orders placed.  Martin [name] was informed that the remainder of the evaluation will be completed by another provider, this initial triage assessment does not replace that evaluation, and the importance of remaining in the ED until their evaluation is complete.  Patient is HDS, overall well-appearing. He will be moved to treatment space when one becomes available. Labs/imaging ordered.      Martin Sid SAILOR, MD 03/20/24 2212

## 2024-03-20 NOTE — ED Triage Notes (Signed)
 BIB [facility] with c/o SOB.  Reports sudden onset of SOB, reports SOB worse with exertion. Also reports non production cough x 3 days.   148/76  104 97% RA CBG 286

## 2024-03-21 LAB — URINALYSIS, ROUTINE W REFLEX MICROSCOPIC
Bacteria, UA: NONE SEEN
Bilirubin Urine: NEGATIVE
Glucose, UA: >=500 mg/dL — AB
Hgb urine dipstick: NEGATIVE
Ketones, ur: NEGATIVE mg/dL
Nitrite: NEGATIVE
Protein, ur: 30 mg/dL — AB
Specific Gravity, Urine: 1.014 (ref 1.005–1.030)
pH: 5 (ref 5.0–8.0)

## 2024-03-21 LAB — PRO BRAIN NATRIURETIC PEPTIDE: Pro Brain Natriuretic Peptide: 388 pg/mL — ABNORMAL HIGH

## 2024-03-21 NOTE — ED Notes (Signed)
 Pt not answering for vitals x2

## 2024-04-10 ENCOUNTER — Encounter (HOSPITAL_COMMUNITY): Payer: Self-pay | Admitting: Emergency Medicine

## 2024-04-10 ENCOUNTER — Emergency Department (HOSPITAL_COMMUNITY)
Admission: EM | Admit: 2024-04-10 | Discharge: 2024-04-10 | Disposition: A | Discharge status: Home / Self Care | Attending: Emergency Medicine | Admitting: Emergency Medicine

## 2024-04-10 DIAGNOSIS — I10 Essential (primary) hypertension: Secondary | ICD-10-CM | POA: Insufficient documentation

## 2024-04-10 DIAGNOSIS — M546 Pain in thoracic spine: Secondary | ICD-10-CM | POA: Insufficient documentation

## 2024-04-10 DIAGNOSIS — Z7984 Long term (current) use of oral hypoglycemic drugs: Secondary | ICD-10-CM | POA: Insufficient documentation

## 2024-04-10 DIAGNOSIS — Z79899 Other long term (current) drug therapy: Secondary | ICD-10-CM | POA: Insufficient documentation

## 2024-04-10 MED ORDER — KETOROLAC TROMETHAMINE 15 MG/ML IJ SOLN
15.0000 mg | Freq: Once | INTRAMUSCULAR | Status: AC
  Administered 2024-04-10: 15 mg via INTRAMUSCULAR
  Filled 2024-04-10 (×2): qty 1

## 2024-04-10 MED ORDER — METHOCARBAMOL 500 MG PO TABS: 500.0000 mg | ORAL_TABLET | Freq: Two times a day (BID) | ORAL | 0 refills | Status: AC

## 2024-04-10 MED ORDER — OXYCODONE-ACETAMINOPHEN 5-325 MG PO TABS
1.0000 | ORAL_TABLET | Freq: Once | ORAL | Status: AC
  Administered 2024-04-10: 1 via ORAL
  Filled 2024-04-10: qty 1

## 2024-04-10 NOTE — ED Triage Notes (Signed)
 79 y/o male comes in c/o non-traumatic back pain for the past two days. Pt has a hx of chronic back pain, but denies any recent injury, fall or trauma. Pt took tylenol  pta and denies any relief. Pt uses a walker, cane at baseline, and per EMS, pt was able to walk to the EMS stretcher with one assist

## 2024-04-10 NOTE — ED Provider Notes (Signed)
 " [facility] EMERGENCY DEPARTMENT AT [facility] Provider Note   CSN: [id number] Arrival date & time: 04/10/24  9184     Patient presents with: Back Pain   Martin [name] is a 79 y.o. male past medical history seen for hypertension, hyperlipidemia, depression, degenerative disc disease, previous lumbar spinal fusion remotely who presents with concern for mid back pain for the last 2 days.  No fall or injury.  No loss control of bladder, bowels.  He reports increased pain with walking.  He denies any numbness, tingling.   HPI     Prior to Admission medications  Medication Sig Start Date End Date Taking? Authorizing Provider  DULoxetine (CYMBALTA) 20 MG capsule Take 20 mg by mouth daily. 03/08/24  Yes [provider]  losartan  (COZAAR ) 50 MG tablet Take 50 mg by mouth daily. 03/08/24  Yes [provider]  metFORMIN  (GLUCOPHAGE ) 500 MG tablet Take 1 tablet (500 mg total) by mouth daily with breakfast. 03/05/15  Yes Levy Nat GAILS, PA-C  methocarbamol  (ROBAXIN ) 500 MG tablet Take 1 tablet (500 mg total) by mouth 2 (two) times daily. 04/10/24  Yes Zaynab Chipman H, PA-C  simvastatin  (ZOCOR ) 80 MG tablet Take 80 mg by mouth daily. 01/14/24  Yes [provider]  Vitamin D, Ergocalciferol, (DRISDOL) 1.25 MG (50000 UNIT) CAPS capsule Take 50,000 Units by mouth once a week. Mondays 02/05/23  Yes [provider]  latanoprost  (XALATAN ) 0.005 % ophthalmic solution Place 1 drop into both eyes at bedtime. Patient not taking: Reported on 04/10/2024    [provider]    Allergies: Enalapril maleate    Review of Systems  All other systems reviewed and are negative.   Updated Vital Signs BP (!) 145/72 (BP Location: Left Arm)   Pulse 88   Temp 98 F (36.7 C) (Oral)   Resp (!) 24   Ht 5' 8 (1.727 m)   Wt 77.1 kg   SpO2 100%   BMI 25.85 kg/m   Physical Exam Vitals and nursing note reviewed.  Constitutional:      General: He is  not in acute distress.    Appearance: Normal appearance.  HENT:     Head: Normocephalic and atraumatic.  Eyes:     General:        Right eye: No discharge.        Left eye: No discharge.  Cardiovascular:     Rate and Rhythm: Normal rate and regular rhythm.     Pulses: Normal pulses.     Heart sounds: No murmur heard.    No friction rub. No gallop.  Pulmonary:     Effort: Pulmonary effort is normal.     Breath sounds: Normal breath sounds.  Abdominal:     General: Bowel sounds are normal.     Palpations: Abdomen is soft.  Musculoskeletal:     Comments: Focal tenderness to palpation in thoracic midline spine and paraspinous muscles.  But intact strength 5/5 bilateral upper and lower extremities.  No step-off or deformity throughout the spine.  Skin:    General: Skin is warm and dry.     Capillary Refill: Capillary refill takes less than 2 seconds.  Neurological:     Mental Status: He is alert and oriented to person, place, and time.  Psychiatric:        Mood and Affect: Mood normal.        Behavior: Behavior normal.     (all labs ordered are listed, but  only abnormal results are displayed) Labs Reviewed - No data to display  EKG: None  Radiology: No results found.   Procedures   Medications Ordered in the ED  oxyCODONE -acetaminophen  (PERCOCET/ROXICET) 5-325 MG per tablet 1 tablet (1 tablet Oral Given 04/10/24 0841)  ketorolac  (TORADOL ) 15 MG/ML injection 15 mg (15 mg Intramuscular Given 04/10/24 0841)                                    Medical Decision Making  Patient with back pain.  My emergent differential diagnosis includes slipped disc, compression fracture, spondylolisthesis, less clinical concern for epidural abscess or osteomyelitis based on patient history.  Overall with high clinical suspicion for thoracic / lumbar sprain, acute on chronic back pain based on clinical presentation, risk factors.  No neurological deficits. Patient is ambulatory. No warning  symptoms of back pain including: fecal incontinence, urinary retention or overflow incontinence, night sweats, waking from sleep with back pain, unexplained fevers or weight loss, h/o cancer, IVDU, recent trauma. Overall low clinical concern for cauda equina, epidural abscess, or other serious cause of back pain.  Given this work-up, evaluation, physical exam I do not believe that radiographic imaging is indicated at this time.  Conservative measures such as rest, ice/heat, ibuprofen , Tylenol , and  prescription for Robaxin  indicated with orthopedic follow-up if no improvement with conservative management.  Extensive return precautions given, patient discharged in stable condition at this time.  Final diagnoses:  Acute midline thoracic back pain    ED Discharge Orders          Ordered    methocarbamol  (ROBAXIN ) 500 MG tablet  2 times daily        04/10/24 0922               Rosan Sherlean DEL, PA-C 04/10/24 9077    Martin Roxie HERO, DO 04/10/24 1017  "

## 2024-04-10 NOTE — Discharge Instructions (Signed)

## 2024-04-28 ENCOUNTER — Ambulatory Visit: Admitting: Pulmonary Disease
